# Patient Record
Sex: Male | Born: 1965 | Race: Black or African American | Hispanic: No | Marital: Married | Smoking: Never smoker
Health system: Southern US, Community
[De-identification: ages and names within clinical notes are randomized; demographics above are authoritative.]

## PROBLEM LIST (undated history)

## (undated) DIAGNOSIS — M1990 Unspecified osteoarthritis, unspecified site: Secondary | ICD-10-CM

## (undated) DIAGNOSIS — E785 Hyperlipidemia, unspecified: Secondary | ICD-10-CM

## (undated) DIAGNOSIS — E119 Type 2 diabetes mellitus without complications: Secondary | ICD-10-CM

## (undated) DIAGNOSIS — I10 Essential (primary) hypertension: Secondary | ICD-10-CM

## (undated) DIAGNOSIS — K219 Gastro-esophageal reflux disease without esophagitis: Secondary | ICD-10-CM

## (undated) DIAGNOSIS — G500 Trigeminal neuralgia: Secondary | ICD-10-CM

## (undated) HISTORY — PX: OTHER SURGICAL HISTORY: SHX169

## (undated) HISTORY — DX: Hyperlipidemia, unspecified: E78.5

## (undated) HISTORY — DX: Gastro-esophageal reflux disease without esophagitis: K21.9

## (undated) HISTORY — DX: Essential (primary) hypertension: I10

## (undated) HISTORY — PX: KNEE SURGERY: SHX244

## (undated) HISTORY — DX: Type 2 diabetes mellitus without complications: E11.9

## (undated) HISTORY — DX: Trigeminal neuralgia: G50.0

## (undated) HISTORY — PX: COLONOSCOPY: SHX174

## (undated) HISTORY — DX: Unspecified osteoarthritis, unspecified site: M19.90

---

## 2008-01-10 ENCOUNTER — Emergency Department (HOSPITAL_COMMUNITY): Admission: EM | Admit: 2008-01-10 | Discharge: 2008-01-10 | Payer: Self-pay | Admitting: Emergency Medicine

## 2008-09-14 ENCOUNTER — Encounter: Admission: RE | Admit: 2008-09-14 | Discharge: 2008-09-14 | Payer: Self-pay | Admitting: Family Medicine

## 2010-06-01 ENCOUNTER — Emergency Department (HOSPITAL_COMMUNITY): Admission: EM | Admit: 2010-06-01 | Discharge: 2010-06-01 | Payer: Self-pay | Admitting: Family Medicine

## 2010-07-21 ENCOUNTER — Emergency Department (HOSPITAL_COMMUNITY)
Admission: EM | Admit: 2010-07-21 | Discharge: 2010-07-21 | Payer: Self-pay | Source: Home / Self Care | Admitting: Family Medicine

## 2014-02-23 ENCOUNTER — Ambulatory Visit (INDEPENDENT_AMBULATORY_CARE_PROVIDER_SITE_OTHER): Payer: 59 | Admitting: Endocrinology

## 2014-02-23 ENCOUNTER — Encounter: Payer: Self-pay | Admitting: Endocrinology

## 2014-02-23 VITALS — BP 119/80 | HR 78 | Temp 98.0°F | Resp 16 | Ht 73.0 in | Wt 341.6 lb

## 2014-02-23 DIAGNOSIS — IMO0001 Reserved for inherently not codable concepts without codable children: Secondary | ICD-10-CM

## 2014-02-23 DIAGNOSIS — E7800 Pure hypercholesterolemia, unspecified: Secondary | ICD-10-CM

## 2014-02-23 DIAGNOSIS — N521 Erectile dysfunction due to diseases classified elsewhere: Secondary | ICD-10-CM | POA: Insufficient documentation

## 2014-02-23 DIAGNOSIS — E119 Type 2 diabetes mellitus without complications: Secondary | ICD-10-CM | POA: Insufficient documentation

## 2014-02-23 DIAGNOSIS — N529 Male erectile dysfunction, unspecified: Secondary | ICD-10-CM

## 2014-02-23 DIAGNOSIS — I10 Essential (primary) hypertension: Secondary | ICD-10-CM | POA: Insufficient documentation

## 2014-02-23 DIAGNOSIS — E1165 Type 2 diabetes mellitus with hyperglycemia: Principal | ICD-10-CM

## 2014-02-23 LAB — GLUCOSE, POCT (MANUAL RESULT ENTRY): POC GLUCOSE: 342 mg/dL — AB (ref 70–99)

## 2014-02-23 MED ORDER — CANAGLIFLOZIN 100 MG PO TABS
100.0000 mg | ORAL_TABLET | Freq: Every day | ORAL | Status: DC
Start: 2014-02-23 — End: 2014-04-21

## 2014-02-23 MED ORDER — GLIMEPIRIDE 2 MG PO TABS: 2.0000 mg | ORAL_TABLET | Freq: Every day | ORAL | Status: DC

## 2014-02-23 NOTE — Patient Instructions (Signed)
Please check blood sugars at least half the time about 2 hours after any meal and twice a week on waking up. Please bring blood sugar monitor to each visit Start Invokana in the morning and glimepiride at suppertime

## 2014-02-23 NOTE — Progress Notes (Signed)
Patient ID: Gregory Fitzgerald, male   DOB: September 21, 1965, 48 y.o.   MRN: [id number]     Reason for Appointment: Consultation for Type 2 Diabetes  History of Present Illness:          Diagnosis: Type 2 diabetes mellitus, date of diagnosis: 2006        Past history: At diagnosis he was not having any symptoms and glucose was discovered incidentally when he was found to be hypertensive He has been on metformin for several years and at some point was also taking Amaryl When his blood sugars were improved he was taken off Amaryl About a year ago because of high blood sugars he was started on Victoza which she tolerated well With this he was able to lose 20 pounds but he thinks his blood sugars were improved only slightly No previous records are available  Recent history:  His blood sugars have been significantly high since about 2013 Detailed records are not available but his A1c reportedly has been around 10% since then Currently is taking 1.8 mg Victoza along with metformin ER 1500 mg daily He did not bring his glucose monitor for review and not clear how often he is checking his readings Glucose in 7/15 on his labwork was 270 Currently does not feel excessively thirsty or have frequent urination. He does drink mostly water and sometimes Gatorade when he is thirsty        Oral hypoglycemic drugs the patient is taking are: Metformin ER      Side effects from medications have been: None Compliance with the medical regimen: Fair  Hypoglycemia: Never    Glucose monitoring:  done 0-2 time a day         Glucometer: Freestyle, this is several years old    Blood Glucose readings  Lowest 140, suppertime as high as 210.  Glycemic control: A1c was 9.7% on 01/27/14   No results found for this basename: HGBA1C   No results found for this basename: GLUF, MICROALBUR, LDLCALC, CREATININE   Urine microalbumin/creatinine ratio 97, done in 7/15 Retinal exam: Most recent: 2014 Self-care: The diet that the  patient has been following is none. He says he has difficulty controlling portions     Meals: 3 meals per day. Breakfast is an egg and toast or boluses.Marland Kitchen His meals are usually  meat and vegetables. He will have snacks with fruit. Eating out about 3 times a week         Exercise: Variable activity, sometimes moving the lawn and occasionally walking and periodically he may go to the gym and use elliptical       Dietician visit: Most recent: 2008 .               Weight history: previous range  320-350  Wt Readings from Last 3 Encounters:  02/23/14 341 lb 9.6 oz (154.949 kg)       Medication List       This list is accurate as of: 02/23/14 11:58 AM.  Always use your most recent med list.               amLODipine 10 MG tablet  Commonly known as:  NORVASC  10 mg daily.     aspirin 81 MG tablet  Take 81 mg by mouth daily.     atorvastatin 20 MG tablet  Commonly known as:  LIPITOR     FREESTYLE LITE test strip  Generic drug:  glucose blood  2 (two) times  daily.     lisinopril-hydrochlorothiazide 20-12.5 MG per tablet  Commonly known as:  PRINZIDE,ZESTORETIC  daily.     metFORMIN 500 MG 24 hr tablet  Commonly known as:  GLUCOPHAGE-XR  3 tablets daily     NOVOFINE 32G X 6 MM Misc  Generic drug:  Insulin Pen Needle     VIAGRA 50 MG tablet  Generic drug:  sildenafil     VICTOZA 18 MG/3ML Sopn  Generic drug:  Liraglutide  1.8 mg.        Allergies: No Known Allergies  No past medical history on file.  No past surgical history on file.  No family history on file.  Social History:  reports that he has never smoked. He has never used smokeless tobacco. His alcohol and drug histories are not on file.    Review of Systems       Lipids: treated with Lipitor 20 mg daily, last LDL 72, HDL 36 and triglycerides 92        No results found for this basename: CHOL, HDL, LDLCALC, LDLDIRECT, TRIG, CHOLHDL                  Skin: No rash or infections     Thyroid:  No   unusual fatigue.     The blood pressure has been controlled with amlodipine and Zestoretic       No swelling of feet.     No shortness of breath or chest tightness  on exertion.     Bowel habits: Normal.       No frequency of urination, occasionally has nocturia    He has had erectile dysfunction for the last few years       No joint  pains.          No history of Numbness, tingling in feet; sometimes he will feel tender in his feet or her burning sensation   LABS:  Office Visit on 02/23/2014  Component Date Value Ref Range Status  . POC Glucose 02/23/2014 342* 70 - 99 mg/dl Final    Physical Examination:  BP 119/80  Pulse 78  Temp(Src) 98 F (36.7 C)  Resp 16  Ht 6\' 1"  (1.854 m)  Wt 341 lb 9.6 oz (154.949 kg)  BMI 45.08 kg/m2  SpO2 95%  GENERAL:         Patient has generalized obesity.   HEENT:         Eye exam shows normal external appearance. Fundus exam shows no retinopathy. Oral exam shows normal mucosa .  NECK:         General:  Neck exam shows no lymphadenopathy. Carotids are normal to palpation and no bruit heard.  Thyroid is not enlarged and no nodules felt.   LUNGS:         Chest is symmetrical. Lungs are clear to auscultation.Marland Kitchen   HEART:         Heart sounds:  S1 and S2 are normal. No murmurs or clicks heard., no S3 or S4.   ABDOMEN:   There is no distention present. Liver and spleen are not palpable. No other mass or tenderness present.  EXTREMITIES:     There is no edema. No skin lesions present.Marland Kitchen  NEUROLOGICAL:   Vibration sense is moderately reduced in toes. Ankle and biceps jerks are absent bilaterally.          Diabetic foot exam: Diabetic foot exam shows normal monofilament sensation in the toes and plantar surfaces, no skin  lesions or ulcers on the feet.  Some callus formation on the big toes especially right Absent left dorsalis pedis, posterior tibialis 2+, right foot pulses are normal MUSCULOSKELETAL:       There is no enlargement or deformity of  the joints. Spine is normal to inspection.Marland Kitchen   SKIN:       No rash or lesions of concern.        ASSESSMENT:  Diabetes type 2, uncontrolled with BMI 45  He has not done well with current regimen of metformin and Victoza and A1c is 9.7% recently Most likely has had progressive insulin deficiency, highest glucose recently is 342 today However he is not currently symptomatic with his hyperglycemia He does not report very high readings at home and not clear how accurate his own FreeStyle monitor is  Also does have some difficulty controlling portions despite using Victoza 1.8 mg He is trying to be active but does not have regular exercise program Although he is a candidate for starting insulin would prefer trying him on alternative drugs to help with control and also facilitate weight loss and he should benefit from using a drug like Invokana.    Discussed action of SGLT 2 drugs on lowering glucose by decreasing kidney absorption of glucose, benefits of weight loss and lower blood pressure, possible side effects including candidiasis and dosage regimen   Other problems include inadequate glucose monitoring, possibly inaccurate home glucose monitor   He could also benefit from more diabetes education including nutritional counseling   Complications: Erectile dysfunction  PLAN:   Start monitoring blood sugars more consistently and bring monitor for download. He will use a new monitor that was given to make sure it is accurate  More consistent exercise  Consultation with dietitian for meal planning  Add Invokana 100 mg daily in the morning, he will watch for any side effects and also monitor blood pressure was starting this   Add glimepiride 2 mg daily at suppertime to help with overnight hypoglycemia  Consider adding Qsymia for weight loss and portion control  Have given him a new FreeStyle glucose monitor to start using   If blood sugars are not improved with this regimen will consider  insulin  Followup in one month  Counseling time over 50% of today's 60 minute visit  Pearle Wandler 02/23/2014, 11:58 AM   Note: This office note was prepared with Estate agent. Any transcriptional errors that result from this process are unintentional.

## 2014-03-13 ENCOUNTER — Other Ambulatory Visit: Payer: Self-pay | Admitting: *Deleted

## 2014-03-13 ENCOUNTER — Telehealth: Payer: Self-pay | Admitting: Endocrinology

## 2014-03-13 DIAGNOSIS — E119 Type 2 diabetes mellitus without complications: Secondary | ICD-10-CM

## 2014-03-13 MED ORDER — METFORMIN HCL ER 500 MG PO TB24: 500.0000 mg | ORAL_TABLET | Freq: Every day | ORAL | Status: DC

## 2014-03-13 NOTE — Telephone Encounter (Signed)
rx sent- patient aware  

## 2014-03-13 NOTE — Telephone Encounter (Signed)
Patient need a refill of his metformin 500 mg.  Wife would like to know if patient should get his meds from Dr Dwyane Dee or his primary Dr Zadie Rhine?  Please Advise

## 2014-04-21 ENCOUNTER — Other Ambulatory Visit: Payer: Self-pay | Admitting: Endocrinology

## 2014-04-23 ENCOUNTER — Ambulatory Visit (INDEPENDENT_AMBULATORY_CARE_PROVIDER_SITE_OTHER): Payer: 59 | Admitting: Endocrinology

## 2014-04-23 ENCOUNTER — Encounter: Payer: Self-pay | Admitting: Endocrinology

## 2014-04-23 VITALS — BP 118/64 | HR 78 | Temp 98.0°F | Resp 16 | Ht 73.0 in | Wt 345.2 lb

## 2014-04-23 DIAGNOSIS — I10 Essential (primary) hypertension: Secondary | ICD-10-CM

## 2014-04-23 DIAGNOSIS — Z23 Encounter for immunization: Secondary | ICD-10-CM

## 2014-04-23 DIAGNOSIS — IMO0002 Reserved for concepts with insufficient information to code with codable children: Secondary | ICD-10-CM

## 2014-04-23 DIAGNOSIS — E1165 Type 2 diabetes mellitus with hyperglycemia: Secondary | ICD-10-CM

## 2014-04-23 DIAGNOSIS — N521 Erectile dysfunction due to diseases classified elsewhere: Secondary | ICD-10-CM

## 2014-04-23 MED ORDER — CANAGLIFLOZIN 300 MG PO TABS: 300.0000 mg | ORAL_TABLET | Freq: Every day | ORAL | Status: DC

## 2014-04-23 MED ORDER — TADALAFIL 20 MG PO TABS: 20.0000 mg | ORAL_TABLET | Freq: Every day | ORAL | Status: DC | PRN

## 2014-04-23 NOTE — Patient Instructions (Signed)
Take 2 Invokana and next Rx 300mg  in am  Glimeperide at bedtime  Exercise daily  1 Lisinopril daily  Please check blood sugars at least half the time about 2 hours after any meal and 3-4 times per week on waking up. Please bring blood sugar monitor to each visit

## 2014-04-23 NOTE — Progress Notes (Addendum)
Patient ID: Gregory Fitzgerald, male   DOB: 05/08/1966, 48 y.o.   MRN: [id number]     Reason for Appointment: Follow up forType 2 Diabetes  History of Present Illness:          Diagnosis: Type 2 diabetes mellitus, date of diagnosis: 2006        Past history: At diagnosis he was not having any symptoms and glucose was discovered incidentally when he was found to be hypertensive He has been on metformin for several years and at some point was also taking Amaryl When his blood sugars were improved he was taken off Amaryl About a year ago because of high blood sugars he was started on Victoza  With this he was able to lose 20 pounds but he thinks his blood sugars were improved only slightly His blood sugars had been significantly high since about 2013 with A1c around 10%  Recent history:   Detailed records are not available but his A1c reportedly has been around 10% since then On his initial consultation he was taking 1.8 mg Victoza along with metformin ER 1500 mg daily Baseline A1c was 9.7% Because of his obesity he was started on Invokana 100 mg for better control Also Amaryl 2 mg was added to help with overnight hyperglycemia He has not followed up since his first visit about 2 months ago However his blood sugars at home are significantly better although still relatively high in the mornings Has only a few readings after her evening meal and these appear to be relatively good No side effects from [place] and his blood pressure is relatively lower also However has not lost any weight        Oral hypoglycemic drugs the patient is taking are: Metformin ER      Side effects from medications have been: None Compliance with the medical regimen: Fair  Hypoglycemia: Never    Glucose monitoring:  done  2 time a day         Glucometer: Freestyle    Blood Glucose readings from download as follows; his meter is about 4 hours behind and difficult to analyze the blood sugar by time of day   PREMEAL  Breakfast Lunch Dinner PCS Overall  Glucose range: 126-197 118-172 123-173 113-171   Mean/median: 155    154   Glycemic control: A1c was 9.7% on 01/27/14   No results found for this basename: HGBA1C   No results found for this basename: GLUF,  MICROALBUR,  LDLCALC,  CREATININE   Urine microalbumin/creatinine ratio 97, done in 7/15 Retinal exam: Most recent: 2014 Self-care: The diet that the patient has been following is none. He says he has difficulty controlling portions     Meals: 3 meals per day. Breakfast is an egg and toast. His meals are usually  meat and vegetables. He will have snacks with fruit.  Eating out about 3 times a week         Exercise: Variable activity: occasionally walking and periodically he may go to the gym and use elliptical       Dietician visit: Most recent: 2008 .               Weight history: previous range  320-350  Wt Readings from Last 3 Encounters:  04/23/14 345 lb 3.2 oz (156.582 kg)  02/23/14 341 lb 9.6 oz (154.949 kg)       Medication List       This list is accurate as of: 04/23/14  4:18 PM.  Always use your most recent med list.               amLODipine 10 MG tablet  Commonly known as:  NORVASC  10 mg daily.     aspirin 81 MG tablet  Take 81 mg by mouth daily.     atorvastatin 20 MG tablet  Commonly known as:  LIPITOR     FREESTYLE LITE test strip  Generic drug:  glucose blood  2 (two) times daily.     glimepiride 2 MG tablet  Commonly known as:  AMARYL  Take 1 tablet (2 mg total) by mouth daily before supper.     INVOKANA 100 MG Tabs  Generic drug:  Canagliflozin  TAKE 1 TABLET (100 MG TOTAL) BY MOUTH DAILY BEFORE BREAKFAST.     lisinopril-hydrochlorothiazide 20-12.5 MG per tablet  Commonly known as:  PRINZIDE,ZESTORETIC  daily.     metFORMIN 500 MG 24 hr tablet  Commonly known as:  GLUCOPHAGE-XR  Take 1 tablet (500 mg total) by mouth daily with breakfast. 3 tablets daily     NOVOFINE 32G X 6 MM Misc  Generic  drug:  Insulin Pen Needle     VIAGRA 50 MG tablet  Generic drug:  sildenafil     VICTOZA 18 MG/3ML Sopn  Generic drug:  Liraglutide  1.8 mg.        Allergies: No Known Allergies  No past medical history on file.  No past surgical history on file.  Family History  Problem Relation Age of Onset  . Diabetes Mother   . Diabetes Father     Social History:  reports that he has never smoked. He has never used smokeless tobacco. His alcohol and drug histories are not on file.    Review of Systems       Lipids: treated with Lipitor 20 mg daily, last LDL 72, HDL 36 and triglycerides 92        No results found for this basename: CHOL,  HDL,  LDLCALC,  LDLDIRECT,  TRIG,  CHOLHDL                   The blood pressure has been controlled with amlodipine and 2 tablets of Zestoretic: checked occasionally at work and readings are similar to today's reading     Complications: Erectile dysfunction. He says that he gets headaches with Viagra 50 mg and is asking for an alternative; is getting only partial improvement of symptoms with this  LABS:  pending  Physical Examination:  BP 118/64  Pulse 78  Temp(Src) 98 F (36.7 C)  Resp 16  Ht 6\' 1"  (1.854 m)  Wt 345 lb 3.2 oz (156.582 kg)  BMI 45.55 kg/m2  SpO2 97%  No pedal edema    ASSESSMENT:  Diabetes type 2, uncontrolled with BMI 45  He has had improved blood sugar control with adding Invokana 100 mg and low-dose Amaryl to his prior regimen of metformin and Victoza On fasting blood sugars are still averaging about 150, blood sugars are relatively lower later in the day He probably had average readings well over 200 prior to this therapy change He may have some insulin deficiency but appears to be responding to low dose Amaryl He may be able to improve his control with higher doses of Invokana and increased efforts to lose weight However basal insulin may need to be started  Erectile dysfunction  secondary to diabetes. He  has only partial response to Viagra and  getting side effects of headaches  PLAN:   Start monitoring blood sugars more consistently after meals  Monitor time was changed to the correct time  Change Amaryl to bedtime and consider increasing the dose if needed  Increase Invokana to 300 mg and discussed possible side effects as well as expected blood pressure reduction  Reduce lisinopril to one tablet instead of 2  More consistent exercise especially aerobic  Consultation with dietitian  Trial of Cialis 20 mg, half to one tablet as needed. Discussed how to take his medication and possible side effects  Counseling time over 50% of today's 25 minute visit   Carless Slatten 04/23/2014, 4:18 PM   Note: This office note was prepared with Estate agent. Any transcriptional errors that result from this process are unintentional.

## 2014-04-24 LAB — BASIC METABOLIC PANEL
BUN: 27 mg/dL — ABNORMAL HIGH (ref 6–23)
CALCIUM: 9.8 mg/dL (ref 8.4–10.5)
CO2: 24 mEq/L (ref 19–32)
Chloride: 104 mEq/L (ref 96–112)
Creatinine, Ser: 1.3 mg/dL (ref 0.4–1.5)
GFR: 77.06 mL/min (ref >60.00)
Glucose, Bld: 119 mg/dL — ABNORMAL HIGH (ref 70–99)
Potassium: 4.6 mEq/L (ref 3.5–5.1)
SODIUM: 137 meq/L (ref 135–145)

## 2014-04-24 LAB — HEMOGLOBIN A1C: HEMOGLOBIN A1C: 6.6 % — AB (ref 4.6–6.5)

## 2014-06-01 ENCOUNTER — Other Ambulatory Visit: Payer: 59

## 2014-06-01 ENCOUNTER — Other Ambulatory Visit: Payer: Self-pay | Admitting: *Deleted

## 2014-06-01 DIAGNOSIS — E1165 Type 2 diabetes mellitus with hyperglycemia: Secondary | ICD-10-CM

## 2014-06-01 DIAGNOSIS — IMO0002 Reserved for concepts with insufficient information to code with codable children: Secondary | ICD-10-CM

## 2014-06-01 DIAGNOSIS — E7800 Pure hypercholesterolemia, unspecified: Secondary | ICD-10-CM

## 2014-06-04 ENCOUNTER — Ambulatory Visit: Payer: 59 | Admitting: Endocrinology

## 2014-06-04 ENCOUNTER — Telehealth: Payer: Self-pay | Admitting: Endocrinology

## 2014-06-04 NOTE — Telephone Encounter (Signed)
Patient no showed today's appt. Please advise on how to follow up. °A. No follow up necessary. °B. Follow up urgent. Contact patient immediately. °C. Follow up necessary. Contact patient and schedule visit in ___ days. °D. Follow up advised. Contact patient and schedule visit in ____weeks. ° °

## 2014-06-04 NOTE — Telephone Encounter (Signed)
Needs to be rescheduled at earliest possible opportunity

## 2014-06-08 NOTE — Telephone Encounter (Signed)
Pt needed to call us back to reschedule. He was at work

## 2014-06-10 ENCOUNTER — Ambulatory Visit (INDEPENDENT_AMBULATORY_CARE_PROVIDER_SITE_OTHER): Payer: 59 | Admitting: Endocrinology

## 2014-06-10 ENCOUNTER — Other Ambulatory Visit: Payer: Self-pay | Admitting: Endocrinology

## 2014-06-10 ENCOUNTER — Encounter: Payer: Self-pay | Admitting: Endocrinology

## 2014-06-10 VITALS — BP 90/68 | HR 88 | Temp 98.0°F | Resp 14 | Ht 73.0 in | Wt 342.6 lb

## 2014-06-10 DIAGNOSIS — E1165 Type 2 diabetes mellitus with hyperglycemia: Secondary | ICD-10-CM

## 2014-06-10 DIAGNOSIS — IMO0002 Reserved for concepts with insufficient information to code with codable children: Secondary | ICD-10-CM

## 2014-06-10 DIAGNOSIS — I10 Essential (primary) hypertension: Secondary | ICD-10-CM

## 2014-06-10 LAB — COMPREHENSIVE METABOLIC PANEL
ALBUMIN: 4.4 g/dL (ref 3.5–5.2)
ALK PHOS: 59 U/L (ref 39–117)
ALT: 15 U/L (ref 0–53)
AST: 16 U/L (ref 0–37)
BUN: 23 mg/dL (ref 6–23)
CO2: 27 mEq/L (ref 19–32)
Calcium: 10.1 mg/dL (ref 8.4–10.5)
Chloride: 102 mEq/L (ref 96–112)
Creat: 1.22 mg/dL (ref 0.50–1.35)
Glucose, Bld: 107 mg/dL — ABNORMAL HIGH (ref 70–99)
POTASSIUM: 4.9 meq/L (ref 3.5–5.3)
SODIUM: 139 meq/L (ref 135–145)
TOTAL PROTEIN: 8 g/dL (ref 6.0–8.3)
Total Bilirubin: 0.8 mg/dL (ref 0.2–1.2)

## 2014-06-10 MED ORDER — LISINOPRIL 10 MG PO TABS: 10.0000 mg | ORAL_TABLET | Freq: Every day | ORAL | Status: DC

## 2014-06-10 NOTE — Patient Instructions (Addendum)
Please check blood sugars at least half the time about 2 hours after any meal and times per week on waking up. Please bring blood sugar monitor to each visit  Glimeperide at 7-8 pm daily  Keep walking  Either do 1/2 current BP pill or Lisiniopril 10mg 

## 2014-06-10 NOTE — Progress Notes (Signed)
Patient ID: Gregory Fitzgerald, male   DOB: 10-Mar-1966, 48 y.o.   MRN: [id number]     Reason for Appointment: Follow up forType 2 Diabetes  History of Present Illness:          Diagnosis: Type 2 diabetes mellitus, date of diagnosis: 2006        Past history: At diagnosis he was not having any symptoms and glucose was discovered incidentally when he was found to be hypertensive He has been on metformin for several years and at some point was also taking Amaryl When his blood sugars were improved he was taken off Amaryl About a year ago because of high blood sugars he was started on Victoza  With this he was able to lose 20 pounds but he thinks his blood sugars were improved only slightly His blood sugars had been significantly high since about 2013 with A1c around 10%  Recent history:  On his initial consultation he was taking 1.8 mg Victoza along with metformin ER 1500 mg daily Baseline A1c was 9.7% Because of his obesity he was started on Invokana 100 mg for better control; Amaryl 2 mg was added because of fasting hyperglycemia A1c had come down to 6.6 in October 2015 On his last visit his Amaryl was changed to bedtime instead of morning and Invokana was increased to 300 mg because of continued high blood sugars He was also asked to check more sugars after meals instead of just in the morning Although his blood sugars are improving he tends to forget to take his Amaryl at bedtime and has occasional high readings in the mornings; less in the last week He has not had any hypoglycemic symptoms Weight has come down 3 pounds        Oral hypoglycemic drugs the patient is taking are: Metformin ER, Invokana, Amaryl      Side effects from medications have been: None Compliance with the medical regimen: Fair  Hypoglycemia: Never    Glucose monitoring:  done  2 time a day         Glucometer: Freestyle    Blood Glucose readings from download as follows :  PREMEAL Breakfast Lunch Dinner PCS  Overall  Glucose range: 91-208  104-116 150   Mean/median:     136    Glycemic control: A1c was 9.7% on 01/27/14   Lab Results  Component Value Date   HGBA1C 6.6* 04/23/2014   No results found for: GLUF Urine microalbumin/creatinine ratio 97, done in 7/15  Retinal exam: Most recent: 2014 Self-care: The diet that the patient has been following is none. He says he has difficulty controlling portions     Meals: 3 meals per day. Breakfast is an egg and toast. His meals are usually  meat and vegetables. He will have snacks with fruit.  Eating out about 3 times a week         Exercise: Variable activity: walking more regularly now Dietician visit: Most recent: 2008 .               Weight history: previous range  320-350  Wt Readings from Last 3 Encounters:  06/10/14 342 lb 9.6 oz (155.402 kg)  04/23/14 345 lb 3.2 oz (156.582 kg)  02/23/14 341 lb 9.6 oz (154.949 kg)       Medication List       This list is accurate as of: 06/10/14  3:29 PM.  Always use your most recent med list.  amLODipine 10 MG tablet  Commonly known as:  NORVASC  10 mg daily.     aspirin 81 MG tablet  Take 81 mg by mouth daily.     atorvastatin 20 MG tablet  Commonly known as:  LIPITOR     canagliflozin 300 MG Tabs tablet  Commonly known as:  INVOKANA  Take 1 tablet (300 mg total) by mouth daily before breakfast.     FREESTYLE LITE test strip  Generic drug:  glucose blood  2 (two) times daily.     glimepiride 2 MG tablet  Commonly known as:  AMARYL  Take 1 tablet (2 mg total) by mouth daily before supper.     lisinopril-hydrochlorothiazide 20-12.5 MG per tablet  Commonly known as:  PRINZIDE,ZESTORETIC  daily.     metFORMIN 500 MG 24 hr tablet  Commonly known as:  GLUCOPHAGE-XR  Take 1 tablet (500 mg total) by mouth daily with breakfast. 3 tablets daily     NOVOFINE 32G X 6 MM Misc  Generic drug:  Insulin Pen Needle     tadalafil 20 MG tablet  Commonly known as:   CIALIS  Take 1 tablet (20 mg total) by mouth daily as needed for erectile dysfunction.     VICTOZA 18 MG/3ML Sopn  Generic drug:  Liraglutide  1.8 mg.        Allergies: No Known Allergies  No past medical history on file.  No past surgical history on file.  Family History  Problem Relation Age of Onset  . Diabetes Mother   . Diabetes Father     Social History:  reports that he has never smoked. He has never used smokeless tobacco. His alcohol and drug histories are not on file.    Review of Systems       Lipids: treated with Lipitor 20 mg daily, last LDL 72, HDL 36 and triglycerides 92        No results found for: CHOL                 The blood pressure has been controlled with amlodipine and  Zestoretic: because of his starting Invokana the Zestoretic was reduced to 1 tablet Blood pressure is checked occasionally at work and readings are  110-115/70.  Does not complain of lightheadedness     Complications: Erectile dysfunction. Cialis 20mg  is more effective than Viagra and has no side effects compared to Viagra     Physical Examination:  BP 112/72 mmHg  Pulse 88  Temp(Src) 98 F (36.7 C)  Resp 14  Ht 6\' 1"  (1.854 m)  Wt 342 lb 9.6 oz (155.402 kg)  BMI 45.21 kg/m2  SpO2 95%  No pedal edema    ASSESSMENT:  Diabetes type 2, uncontrolled with BMI 45  He has had improved blood sugar control with using Invokana 300 mg and low-dose Amaryl in the evenings along with his prior regimen of metformin and Victoza His A1c was 6.6 in 10/15 He has done better with changing the Amaryl to the evening but may occasionally forget this at night Fasting blood sugars are still relatively higher than suppertime but has not done enough readings after meals again  HYPERTENSION: His blood pressure is significantly lower with adding Invokana and he is orthostatic today   PLAN:   Start monitoring blood sugars more consistently after meals  More consistent exercise  Change  Amaryl to 7-8 PM to help with compliance  Reduce lisinopril HCT to half a tablet and with next prescription  he will use only 10 mg lisinopril  Call if blood sugars are not well-controlled    Daylin Eads 06/10/2014, 3:29 PM   Note: This office note was prepared with Estate agent. Any transcriptional errors that result from this process are unintentional.

## 2014-08-26 ENCOUNTER — Other Ambulatory Visit: Payer: Self-pay | Admitting: Endocrinology

## 2014-08-30 ENCOUNTER — Other Ambulatory Visit: Payer: Self-pay | Admitting: Endocrinology

## 2014-09-07 ENCOUNTER — Other Ambulatory Visit: Payer: 59

## 2014-09-07 ENCOUNTER — Encounter: Payer: Self-pay | Admitting: Endocrinology

## 2014-09-10 ENCOUNTER — Ambulatory Visit: Payer: 59 | Admitting: Endocrinology

## 2015-01-05 ENCOUNTER — Other Ambulatory Visit: Payer: Self-pay | Admitting: Endocrinology

## 2015-01-28 ENCOUNTER — Other Ambulatory Visit: Payer: Self-pay | Admitting: Endocrinology

## 2015-02-07 ENCOUNTER — Other Ambulatory Visit: Payer: Self-pay | Admitting: Endocrinology

## 2015-02-18 ENCOUNTER — Ambulatory Visit (INDEPENDENT_AMBULATORY_CARE_PROVIDER_SITE_OTHER): Payer: Commercial Managed Care - HMO | Admitting: Endocrinology

## 2015-02-18 ENCOUNTER — Other Ambulatory Visit: Payer: Self-pay | Admitting: *Deleted

## 2015-02-18 ENCOUNTER — Encounter: Payer: Self-pay | Admitting: Endocrinology

## 2015-02-18 VITALS — BP 138/76 | HR 96 | Temp 98.2°F | Resp 16 | Ht 73.0 in | Wt 345.4 lb

## 2015-02-18 DIAGNOSIS — E1165 Type 2 diabetes mellitus with hyperglycemia: Secondary | ICD-10-CM

## 2015-02-18 DIAGNOSIS — IMO0002 Reserved for concepts with insufficient information to code with codable children: Secondary | ICD-10-CM

## 2015-02-18 DIAGNOSIS — E7800 Pure hypercholesterolemia, unspecified: Secondary | ICD-10-CM

## 2015-02-18 DIAGNOSIS — I10 Essential (primary) hypertension: Secondary | ICD-10-CM

## 2015-02-18 LAB — POCT GLYCOSYLATED HEMOGLOBIN (HGB A1C): Hemoglobin A1C: 8.9

## 2015-02-18 MED ORDER — METFORMIN HCL ER 500 MG PO TB24: 500.0000 mg | ORAL_TABLET | Freq: Every day | ORAL | Status: DC

## 2015-02-18 MED ORDER — LIRAGLUTIDE 18 MG/3ML ~~LOC~~ SOPN: 1.8000 mg | PEN_INJECTOR | Freq: Every day | SUBCUTANEOUS | Status: DC

## 2015-02-18 MED ORDER — TADALAFIL 20 MG PO TABS: 20.0000 mg | ORAL_TABLET | Freq: Every day | ORAL | Status: DC | PRN

## 2015-02-18 MED ORDER — GLIMEPIRIDE 2 MG PO TABS: 2.0000 mg | ORAL_TABLET | Freq: Every day | ORAL | Status: DC

## 2015-02-18 MED ORDER — GLUCOSE BLOOD VI STRP: ORAL_STRIP | Status: DC

## 2015-02-18 NOTE — Patient Instructions (Addendum)
Check blood sugars on waking up .Marland Kitchen2-3  .Marland Kitchen times a week Also check blood sugars about 2 hours after a meal and do this after different meals by rotation  Recommended blood sugar levels on waking up is 90-130 and about 2 hours after meal is 140-180 Please bring blood sugar monitor to each visit.  Victoza start with 0.6 mg and go up every 3 days  Walk daily

## 2015-02-18 NOTE — Progress Notes (Signed)
Patient ID: Gregory Fitzgerald, male   DOB: 08-05-1965, 49 y.o.   MRN: [id number]     Reason for Appointment: Follow up forType 2 Diabetes  History of Present Illness:          Diagnosis: Type 2 diabetes mellitus, date of diagnosis: 2006        Past history: At diagnosis he was not having any symptoms and glucose was discovered incidentally when he was found to be hypertensive He has been on metformin for several years and at some point was also taking Amaryl His blood sugars had been significantly high since about 2013 with A1c around 10% On his initial consultation he was taking 1.8 mg Victoza along with metformin ER 1500 mg daily Baseline A1c was 9.7% Because of his obesity he was started on Invokana 100 mg for better control; Amaryl 2 mg was added because of fasting hyperglycemia A1c had come down to 6.6 in October 2015  Recent history:  7/16   On his last visit his control was excellent with A1c 6.6%   Amaryl was changed to bedtime instead of morning and Invokana was increased to 300 mg previously He has not been seen in follow-up for almost a year now  He ran out of his Invokana and Victoza about 2 months ago and is now here for further follow-up Also has not had a fasting blood sugar monitor for the last week at least  Not sure if he is taking his blood sugar readings after meals also He has not been symptomatic with high readings and has not lost any weight Not very active on his own and meal compliance is variable        Oral hypoglycemic drugs the patient is taking are: Metformin ER, Invokana, Amaryl      Side effects from medications have been: None Compliance with the medical regimen: Fair  Hypoglycemia: Never    Glucose monitoring:  done  0-2 time a day         Glucometer: Freestyle    Blood Glucose readings from recall  recently around 200   Lab Results  Component Value Date   HGBA1C 8.9 02/18/2015   HGBA1C 6.6* 04/23/2014   Lab Results  Component Value Date   CREATININE 1.22 06/10/2014      Urine microalbumin/creatinine ratio 97, done in 7/15  Self-care: The diet that the patient has been following is none. He says he has difficulty controlling portions     Meals: 3 meals per day. Breakfast is an egg and toast. His meals are usually  meat and vegetables. He will have snacks with fruit.  Eating out about 3 times a week         Exercise: Variable activity: walking At times, sometimes more at work Dietician visit: Most recent: 2008 .               Weight history: previous range  320-350  Wt Readings from Last 3 Encounters:  02/18/15 345 lb 6.4 oz (156.672 kg)  06/10/14 342 lb 9.6 oz (155.402 kg)  04/23/14 345 lb 3.2 oz (156.582 kg)       Medication List       This list is accurate as of: 02/18/15 11:59 PM.  Always use your most recent med list.               amLODipine 10 MG tablet  Commonly known as:  NORVASC  10 mg daily.     aspirin 81 MG tablet  Take 81 mg by mouth daily.     atorvastatin 20 MG tablet  Commonly known as:  LIPITOR     glimepiride 2 MG tablet  Commonly known as:  AMARYL  Take 1 tablet (2 mg total) by mouth daily before supper.     glucose blood test strip  Commonly known as:  FREESTYLE LITE  Use to check blood sugar 3 times per day dx code E11.65     INVOKANA 300 MG Tabs tablet  Generic drug:  canagliflozin  TAKE 1 TABLET BY MOUTH EVERY DAY BEFORE BREAKFAST     Liraglutide 18 MG/3ML Sopn  Commonly known as:  VICTOZA  Inject 0.3 mLs (1.8 mg total) into the skin daily.     lisinopril 10 MG tablet  Commonly known as:  ZESTRIL  Take 1 tablet (10 mg total) by mouth daily.     lisinopril-hydrochlorothiazide 20-12.5 MG per tablet  Commonly known as:  PRINZIDE,ZESTORETIC  Take 2 tablets by mouth every morning.     metFORMIN 500 MG 24 hr tablet  Commonly known as:  GLUCOPHAGE-XR  Take 1 tablet (500 mg total) by mouth daily with breakfast. 3 tablets daily     NOVOFINE 32G X 6 MM Misc  Generic  drug:  Insulin Pen Needle     tadalafil 20 MG tablet  Commonly known as:  CIALIS  Take 1 tablet (20 mg total) by mouth daily as needed for erectile dysfunction.        Allergies: No Known Allergies  No past medical history on file.  No past surgical history on file.  Family History  Problem Relation Age of Onset  . Diabetes Mother   . Diabetes Father     Social History:  reports that he has never smoked. He has never used smokeless tobacco. His alcohol and drug histories are not on file.    Review of Systems        Lipids: treated with Lipitor 20 mg daily, last LDL 72, HDL 36 and triglycerides 92       No results found for: CHOL, HDL, LDLCALC, LDLDIRECT, TRIG, CHOLHDL     The blood pressure has been controlled with amlodipine and Zestoretic half tablet daily   Complications: Erectile dysfunction. Cialis 20mg  is effective and he is asking for refill     diabetic foot exam in 01/2015   Physical Examination:  BP 138/76 mmHg  Pulse 96  Temp(Src) 98.2 F (36.8 C)  Resp 16  Ht 6\' 1"  (1.854 m)  Wt 345 lb 6.4 oz (156.672 kg)  BMI 45.58 kg/m2  SpO2 96%  Diabetic foot exam shows normal monofilament sensation in the toes and plantar surfaces, no skin lesions or ulcers on the feet  Decreased pulses in feet, calluses Rt 1st toe     ASSESSMENT:  Diabetes type 2, uncontrolled with BMI 45  He has had improved blood sugar control with using Invokana 300 mg and low-dose Amaryl in the evenings along with his prior regimen of metformin and Victoza However he has run out of his Invokana and Victoza and blood sugars are significantly higher  His A1c was 6.6 in 10/15 and now it is up to 8.9  He has also not been checking his blood sugar regularly   HYPERTENSION: His blood pressure is fairly well controlled even with continuing half a tablet of Zestoretic   PLAN:   Start monitoring blood sugars more consistently   More consistent exercise  Follow-up in 6 weeks after  restarting  Invokana and Victoza.  Discussed titrating Victoza from a smaller dose and work up to 1.8 mg   Will need to check his microalbumin and lipids  Patient Instructions  Check blood sugars on waking up .Marland Kitchen2-3  .Marland Kitchen times a week Also check blood sugars about 2 hours after a meal and do this after different meals by rotation  Recommended blood sugar levels on waking up is 90-130 and about 2 hours after meal is 140-180 Please bring blood sugar monitor to each visit.  Victoza start with 0.6 mg and go up every 3 days  Walk daily     Gregory Fitzgerald 02/19/2015, 8:17 AM   Note: This office note was prepared with Estate agent. Any transcriptional errors that result from this process are unintentional.

## 2015-02-19 LAB — COMPREHENSIVE METABOLIC PANEL
ALK PHOS: 73 U/L (ref 39–117)
ALT: 14 U/L (ref 0–53)
AST: 18 U/L (ref 0–37)
Albumin: 4.2 g/dL (ref 3.5–5.2)
BILIRUBIN TOTAL: 0.6 mg/dL (ref 0.2–1.2)
BUN: 22 mg/dL (ref 6–23)
CALCIUM: 10.2 mg/dL (ref 8.4–10.5)
CHLORIDE: 104 meq/L (ref 96–112)
CO2: 25 meq/L (ref 19–32)
Creatinine, Ser: 1.27 mg/dL (ref 0.40–1.50)
GFR: 78.18 mL/min (ref >60.00)
GLUCOSE: 243 mg/dL — AB (ref 70–99)
Potassium: 4.3 mEq/L (ref 3.5–5.1)
Sodium: 140 mEq/L (ref 135–145)
Total Protein: 7.7 g/dL (ref 6.0–8.3)

## 2015-02-19 LAB — LIPID PANEL
CHOL/HDL RATIO: 4
Cholesterol: 199 mg/dL (ref 0–200)
HDL: 44.7 mg/dL (ref >39.00)
LDL CALC: 120 mg/dL — AB (ref 0–99)
NonHDL: 154.66
TRIGLYCERIDES: 173 mg/dL — AB (ref 0.0–149.0)
VLDL: 34.6 mg/dL (ref 0.0–40.0)

## 2015-02-19 LAB — MICROALBUMIN / CREATININE URINE RATIO
CREATININE, U: 231.1 mg/dL
MICROALB UR: 13.5 mg/dL — AB (ref 0.0–1.9)
MICROALB/CREAT RATIO: 5.8 mg/g (ref 0.0–30.0)

## 2015-02-19 LAB — HEMOGLOBIN A1C: HEMOGLOBIN A1C: 8.8 % — AB (ref 4.6–6.5)

## 2015-02-25 ENCOUNTER — Other Ambulatory Visit: Payer: Self-pay | Admitting: Endocrinology

## 2015-03-30 ENCOUNTER — Other Ambulatory Visit: Payer: Self-pay | Admitting: Endocrinology

## 2015-04-01 ENCOUNTER — Other Ambulatory Visit: Payer: Self-pay | Admitting: *Deleted

## 2015-04-01 ENCOUNTER — Other Ambulatory Visit: Payer: Commercial Managed Care - HMO

## 2015-04-01 ENCOUNTER — Other Ambulatory Visit (INDEPENDENT_AMBULATORY_CARE_PROVIDER_SITE_OTHER): Payer: Commercial Managed Care - HMO

## 2015-04-01 DIAGNOSIS — E1165 Type 2 diabetes mellitus with hyperglycemia: Secondary | ICD-10-CM

## 2015-04-01 DIAGNOSIS — IMO0002 Reserved for concepts with insufficient information to code with codable children: Secondary | ICD-10-CM

## 2015-04-01 LAB — BASIC METABOLIC PANEL
BUN: 43 mg/dL — ABNORMAL HIGH (ref 6–23)
CALCIUM: 9.5 mg/dL (ref 8.4–10.5)
CO2: 23 mEq/L (ref 19–32)
Chloride: 101 mEq/L (ref 96–112)
Creatinine, Ser: 1.77 mg/dL — ABNORMAL HIGH (ref 0.40–1.50)
GFR: 53.28 mL/min — AB (ref >60.00)
Glucose, Bld: 176 mg/dL — ABNORMAL HIGH (ref 70–99)
POTASSIUM: 4.4 meq/L (ref 3.5–5.1)
SODIUM: 134 meq/L — AB (ref 135–145)

## 2015-04-01 LAB — MICROALBUMIN / CREATININE URINE RATIO
CREATININE, U: 296.4 mg/dL
MICROALB UR: 2.9 mg/dL — AB (ref 0.0–1.9)
Microalb Creat Ratio: 1 mg/g (ref 0.0–30.0)

## 2015-04-01 MED ORDER — INSULIN PEN NEEDLE 32G X 6 MM MISC: Status: DC

## 2015-04-05 ENCOUNTER — Ambulatory Visit: Payer: Commercial Managed Care - HMO | Admitting: Endocrinology

## 2015-04-07 ENCOUNTER — Encounter: Payer: Self-pay | Admitting: Endocrinology

## 2015-04-07 ENCOUNTER — Ambulatory Visit (INDEPENDENT_AMBULATORY_CARE_PROVIDER_SITE_OTHER): Payer: Commercial Managed Care - HMO | Admitting: Endocrinology

## 2015-04-07 VITALS — BP 90/60 | HR 94 | Temp 99.1°F | Resp 16 | Ht 73.0 in | Wt 338.2 lb

## 2015-04-07 DIAGNOSIS — E1165 Type 2 diabetes mellitus with hyperglycemia: Secondary | ICD-10-CM

## 2015-04-07 DIAGNOSIS — N289 Disorder of kidney and ureter, unspecified: Secondary | ICD-10-CM

## 2015-04-07 DIAGNOSIS — I951 Orthostatic hypotension: Secondary | ICD-10-CM

## 2015-04-07 DIAGNOSIS — IMO0002 Reserved for concepts with insufficient information to code with codable children: Secondary | ICD-10-CM

## 2015-04-07 NOTE — Progress Notes (Signed)
Patient ID: Gregory Fitzgerald, male   DOB: 03-11-1966, 49 y.o.   MRN: [id number]     Reason for Appointment: Follow up forType 2 Diabetes  History of Present Illness:          Diagnosis: Type 2 diabetes mellitus, date of diagnosis: 2006        Past history: At diagnosis he was not having any symptoms and glucose was discovered incidentally when he was found to be hypertensive He has been on metformin for several years and at some point was also taking Amaryl His blood sugars had been significantly high since about 2013 with A1c around 10% On his initial consultation he was taking 1.8 mg Victoza along with metformin ER 1500 mg daily Baseline A1c was 9.7% Because of his obesity he was started on Invokana 100 mg for better control; Amaryl 2 mg was added because of fasting hyperglycemia A1c had come down to 6.6 in October 2015  Recent history:    He ran out of his [place] prior to his last visit and his A1c went up to 8.9%. Previously had no difficulties with taking these medications and he was told to start back on these again at the same dosage He was told to go up to 300 mg on Invokana. More recently has taken only 1.2 mg Victoza and has not gone up to 1.8 doses Also this week when his creatinine came back high he was told to take only half Invokana and reduce metformin to 1500 mg instead of 2000  However his blood sugars are still not well controlled with mostly high readings; also he did not have Victoza for 4 days recently  Blood sugars from his FreeStyle monitor show:  Mean values apply above for all meters except median for One Touch  PRE-MEAL Fasting Lunch Dinner Bedtime Overall  Glucose range:  126-213   184   178-190    126-213   Mean/median:  169      176     Oral hypoglycemic drugs the patient is taking are: Metformin ER, Invokana, Amaryl      Side effects from medications have been: None Compliance with the medical regimen: Fair  Hypoglycemia:  Never     Self-care: The diet that the patient has been following is none. He says he has difficulty controlling portions     Meals: 3 meals per day. Breakfast is an egg and toast. His meals are usually  meat and vegetables. He will have snacks with fruit.  Eating out about 3 times a week         Exercise: Variable activity: walking At times, sometimes more at work Dietician visit: Most recent: 2008 .   Wt Readings from Last 3 Encounters:  04/07/15 338 lb 3.2 oz (153.407 kg)  02/18/15 345 lb 6.4 oz (156.672 kg)  06/10/14 342 lb 9.6 oz (155.402 kg)      Lab Results  Component Value Date   HGBA1C 8.8* 02/18/2015   HGBA1C 8.9 02/18/2015   HGBA1C 6.6* 04/23/2014   Lab Results  Component Value Date   MICROALBUR 2.9* 04/01/2015   LDLCALC 120* 02/18/2015   CREATININE 1.77* 04/01/2015      Urine microalbumin/creatinine ratio 97, done in 7/15                Medication List       This list is accurate as of: 04/07/15 11:59 PM.  Always use your most recent med list.  amLODipine 10 MG tablet  Commonly known as:  NORVASC  10 mg daily.     aspirin 81 MG tablet  Take 81 mg by mouth daily.     atorvastatin 20 MG tablet  Commonly known as:  LIPITOR     glimepiride 2 MG tablet  Commonly known as:  AMARYL  Take 1 tablet (2 mg total) by mouth daily before supper.     glucose blood test strip  Commonly known as:  FREESTYLE LITE  Use to check blood sugar 3 times per day dx code E11.65     Insulin Pen Needle 32G X 6 MM Misc  Commonly known as:  NOVOFINE  Use 1 per day to inject Victoza     INVOKANA 300 MG Tabs tablet  Generic drug:  canagliflozin  TAKE 1 TABLET BY MOUTH EVERY DAY BEFORE BREAKFAST     INVOKANA 300 MG Tabs tablet  Generic drug:  canagliflozin  TAKE 1 TABLET BY MOUTH EVERY DAY BEFORE BREAKFAST     Liraglutide 18 MG/3ML Sopn  Commonly known as:  VICTOZA  Inject 0.3 mLs (1.8 mg total) into the skin daily.     lisinopril 10 MG tablet    Commonly known as:  ZESTRIL  Take 1 tablet (10 mg total) by mouth daily.     lisinopril-hydrochlorothiazide 20-12.5 MG per tablet  Commonly known as:  PRINZIDE,ZESTORETIC  Take 2 tablets by mouth every morning.     metFORMIN 500 MG 24 hr tablet  Commonly known as:  GLUCOPHAGE-XR  TAKE 4 TABLETS ONCE A DAY ORALLY *NEEDS OFFICE VISIT*     tadalafil 20 MG tablet  Commonly known as:  CIALIS  Take 1 tablet (20 mg total) by mouth daily as needed for erectile dysfunction.        Allergies: No Known Allergies  No past medical history on file.  No past surgical history on file.  Family History  Problem Relation Age of Onset  . Diabetes Mother   . Diabetes Father     Social History:  reports that he has never smoked. He has never used smokeless tobacco. His alcohol and drug histories are not on file.    Review of Systems        Lipids: treated with Lipitor 20 mg daily, last LDL 72, HDL 36 and triglycerides 92        Lab Results  Component Value Date   CHOL 199 02/18/2015   HDL 44.70 02/18/2015   LDLCALC 120* 02/18/2015   TRIG 173.0* 02/18/2015   CHOLHDL 4 02/18/2015       The blood pressure has been controlled with amlodipine and Zestoretic half tablet daily He has been taking a half tablet Zestoretic with his Invokana He says since last weekend he has felt a little more tired and on Saturday was feeling lightheaded and weak  when he was out walking around he does tend to have a very hot working environment especially in summer  No recent joint pains. No urinary difficulties  His renal function is significantly worse, had been previously normal.  Not taking any ibuprofen or naproxen    Complications: Erectile dysfunction. Cialis 20mg  is effective     diabetic foot exam in 01/2015 showed normal monofilament sensation, Decreased pulses in feet, calluses Rt 1st toe  Physical Examination:  BP 90/60 mmHg  Pulse 94  Temp(Src) 99.1 F (37.3 C)  Resp 16  Ht 6\' 1"   (1.854 m)  Wt 338 lb 3.2 oz (153.407 kg)  BMI 44.63 kg/m2  SpO2 96%  Blood pressure checked 3 times today, standing blood pressure consistently low  No edema     ASSESSMENT:  Diabetes type 2, uncontrolled with BMI 45  He has had improved blood sugar control But still has relatively high readings including in the morning He still has difficulty losing weight and is not doing any exercise on his own as well as may not be consistent with his diet   HYPERTENSION: His blood pressure is unusually low for him This may be result of being more active and exposure to heat as well as continuing both Invokana and Zestoretic This is also cause renal dysfunction recently   RENAL dysfunction: This is likely to be from volume depletion as discussed above from various factors including medications  PLAN:   He will stop his lisinopril and for the moment stop Invokana  Recheck renal function in about a week  If renal function is back to normal he can be switched to 100 mg Invokana and he can go back to 2000 mg of metformin.  Increase Amaryl to 4 mg daily for now  Increase Victoza to 1.8 mg  Increase fluid intake  Monitor blood pressure without lisinopril HCT   Patient Instructions  Check blood sugars on waking up ..  3.. times a week Also check blood sugars about 2 hours after a meal and do this after different meals by rotation Recommended blood sugar levels on waking up is 90-130 and about 2 hours after meal is 140-180 Please bring blood sugar monitor to each visit.  STOP lisinopril completely, stop Invokana Increase Victoza to 1.8 mg  Do not take amlodipine for 3 days  GLIMEPIRIDE: Take 2 tablets of the 2 mg at suppertime daily.  If the blood sugar starts going below 100 reduce the dose to 1 tablet again  Increase fluid intake     Gregory Fitzgerald 04/08/2015, 8:57 PM   Note: This office note was prepared with Estate agent. Any transcriptional errors  that result from this process are unintentional.

## 2015-04-07 NOTE — Patient Instructions (Addendum)
Check blood sugars on waking up ..  3.. times a week Also check blood sugars about 2 hours after a meal and do this after different meals by rotation Recommended blood sugar levels on waking up is 90-130 and about 2 hours after meal is 140-180 Please bring blood sugar monitor to each visit.  STOP lisinopril completely, stop Invokana Increase Victoza to 1.8 mg  Do not take amlodipine for 3 days  GLIMEPIRIDE: Take 2 tablets of the 2 mg at suppertime daily.  If the blood sugar starts going below 100 reduce the dose to 1 tablet again  Increase fluid intake

## 2015-04-14 ENCOUNTER — Other Ambulatory Visit (INDEPENDENT_AMBULATORY_CARE_PROVIDER_SITE_OTHER): Payer: Commercial Managed Care - HMO

## 2015-04-14 ENCOUNTER — Other Ambulatory Visit: Payer: Commercial Managed Care - HMO

## 2015-04-14 DIAGNOSIS — N289 Disorder of kidney and ureter, unspecified: Secondary | ICD-10-CM | POA: Diagnosis not present

## 2015-04-14 LAB — BASIC METABOLIC PANEL
BUN: 17 mg/dL (ref 6–23)
CALCIUM: 9.8 mg/dL (ref 8.4–10.5)
CO2: 30 mEq/L (ref 19–32)
Chloride: 106 mEq/L (ref 96–112)
Creatinine, Ser: 1.01 mg/dL (ref 0.40–1.50)
GFR: 101.77 mL/min (ref >60.00)
Glucose, Bld: 104 mg/dL — ABNORMAL HIGH (ref 70–99)
Potassium: 4.4 mEq/L (ref 3.5–5.1)
SODIUM: 140 meq/L (ref 135–145)

## 2015-04-28 ENCOUNTER — Ambulatory Visit: Payer: Commercial Managed Care - HMO | Admitting: Endocrinology

## 2015-05-04 ENCOUNTER — Other Ambulatory Visit: Payer: Commercial Managed Care - HMO

## 2015-05-05 ENCOUNTER — Other Ambulatory Visit (INDEPENDENT_AMBULATORY_CARE_PROVIDER_SITE_OTHER): Payer: Commercial Managed Care - HMO

## 2015-05-05 ENCOUNTER — Other Ambulatory Visit: Payer: Self-pay | Admitting: *Deleted

## 2015-05-05 DIAGNOSIS — E1100 Type 2 diabetes mellitus with hyperosmolarity without nonketotic hyperglycemic-hyperosmolar coma (NKHHC): Secondary | ICD-10-CM

## 2015-05-05 LAB — BASIC METABOLIC PANEL
BUN: 16 mg/dL (ref 6–23)
CALCIUM: 9.6 mg/dL (ref 8.4–10.5)
CO2: 27 mEq/L (ref 19–32)
CREATININE: 1.09 mg/dL (ref 0.40–1.50)
Chloride: 103 mEq/L (ref 96–112)
GFR: 93.18 mL/min (ref >60.00)
Glucose, Bld: 84 mg/dL (ref 70–99)
Potassium: 4.2 mEq/L (ref 3.5–5.1)
SODIUM: 138 meq/L (ref 135–145)

## 2015-05-05 LAB — HEMOGLOBIN A1C: HEMOGLOBIN A1C: 5.4 % (ref 4.6–6.5)

## 2015-05-07 ENCOUNTER — Ambulatory Visit (INDEPENDENT_AMBULATORY_CARE_PROVIDER_SITE_OTHER): Payer: Commercial Managed Care - HMO | Admitting: Endocrinology

## 2015-05-07 ENCOUNTER — Encounter: Payer: Self-pay | Admitting: Endocrinology

## 2015-05-07 VITALS — BP 149/93 | HR 82 | Temp 98.2°F | Resp 16 | Ht 73.0 in | Wt 328.6 lb

## 2015-05-07 DIAGNOSIS — I10 Essential (primary) hypertension: Secondary | ICD-10-CM | POA: Diagnosis not present

## 2015-05-07 DIAGNOSIS — E119 Type 2 diabetes mellitus without complications: Secondary | ICD-10-CM | POA: Diagnosis not present

## 2015-05-07 DIAGNOSIS — Z23 Encounter for immunization: Secondary | ICD-10-CM | POA: Diagnosis not present

## 2015-05-07 MED ORDER — LISINOPRIL 10 MG PO TABS: 10.0000 mg | ORAL_TABLET | Freq: Every day | ORAL | Status: DC

## 2015-05-07 NOTE — Progress Notes (Signed)
Patient ID: QUE MENEELY, male   DOB: [birth year], 49 y.o.   MRN: [id number]     Reason for Appointment: Follow up forType 2 Diabetes  History of Present Illness:          Diagnosis: Type 2 diabetes mellitus, date of diagnosis: 2006        Past history: At diagnosis he was not having any symptoms and glucose was discovered incidentally when he was found to be hypertensive He has been on metformin for several years and at some point was also taking Amaryl His blood sugars had been significantly high since about 2013 with A1c around 10% On his initial consultation he was taking 1.8 mg Victoza along with metformin ER 1500 mg daily Baseline A1c was 9.7% Because of his obesity he was started on Invokana 100 mg for better control; Amaryl 2 mg was added because of fasting hyperglycemia A1c had come down to 6.6 in October 2015  Recent history:    His blood sugars have been excellent recently and A1c is down to 5.4% which is probably the best he has had Most of the improvement in blood sugar control appears to be from is trying to change his diet significantly as well as starting to exercise at the gym 5 days a week  Current blood sugar patterns and management:  He has checked his blood sugars about 3 times a day consistently 9 they are fairly well controlled throughout the day  On average his blood sugars are slightly higher midday and early afternoon but excellent the rest of the time  When his blood sugars started getting slightly low he stop the Amaryl on his own  Continues to take 1.8 mg Victoza also  His Invokana has been reduced to half tablet and metformin 1500 mg because of previously high creatinine  He has not been compliant with all his medications and refills consistently  He is asking about blood sugars being relatively lower after he exercises in the early evening, however lowest reading is 77   Blood sugars from his FreeStyle monitor show:  Mean values  apply above for all meters except median for One Touch  PRE-MEAL Fasting Lunch Dinner Bedtime Overall  Glucose range:  90-139   102-160   77-140   79-121   77-160   Mean/median:  110      105     Oral hypoglycemic drugs the patient is taking are: Metformin ER, Invokana, Amaryl      Side effects from medications have been: None Compliance with the medical regimen: Fair  Hypoglycemia: Never    Self-care: The diet that the patient has been following is small portions and carbohydrate intake is limited     Meals: 3 meals per day. Breakfast is an egg and toast. His meals are usually  meat and vegetables. He will have snacks with fruit.  Eating out about 3 times a week         Exercise:Gym 5/7 exercise program for the last few weeks  Dietician visit: Most recent: 2008 .   Wt Readings from Last 3 Encounters:  05/07/15 328 lb 9.6 oz (149.052 kg)  04/07/15 338 lb 3.2 oz (153.407 kg)  02/18/15 345 lb 6.4 oz (156.672 kg)      Lab Results  Component Value Date   HGBA1C 5.4 05/05/2015   HGBA1C 8.8* 02/18/2015   HGBA1C 8.9 02/18/2015   Lab Results  Component Value Date   MICROALBUR 2.9* 04/01/2015   [place]  120* 02/18/2015   CREATININE 1.09 05/05/2015                      Medication List       This list is accurate as of: 05/07/15 11:59 PM.  Always use your most recent med list.               amLODipine 10 MG tablet  Commonly known as:  NORVASC  10 mg daily.     aspirin 81 MG tablet  Take 81 mg by mouth daily.     atorvastatin 20 MG tablet  Commonly known as:  LIPITOR     glucose blood test strip  Commonly known as:  FREESTYLE LITE  Use to check blood sugar 3 times per day dx code E11.65     Insulin Pen Needle 32G X 6 MM Misc  Commonly known as:  NOVOFINE  Use 1 per day to inject Victoza     INVOKANA 300 MG Tabs tablet  Generic drug:  canagliflozin  TAKE 1 TABLET BY MOUTH EVERY DAY BEFORE BREAKFAST     Liraglutide 18 MG/3ML Sopn  Commonly known as:   VICTOZA  Inject 0.3 mLs (1.8 mg total) into the skin daily.     lisinopril 10 MG tablet  Commonly known as:  ZESTRIL  Take 1 tablet (10 mg total) by mouth daily.     metFORMIN 500 MG 24 hr tablet  Commonly known as:  GLUCOPHAGE-XR  TAKE 4 TABLETS ONCE A DAY ORALLY *NEEDS OFFICE VISIT*     tadalafil 20 MG tablet  Commonly known as:  CIALIS  Take 1 tablet (20 mg total) by mouth daily as needed for erectile dysfunction.        Allergies: No Known Allergies  No past medical history on file.  No past surgical history on file.  Family History  Problem Relation Age of Onset  . Diabetes Mother   . Diabetes Father     Social History:  reports that he has never smoked. He has never used smokeless tobacco. His alcohol and drug histories are not on file.    Review of Systems        Lipids: treated with Lipitor 20 mg daily, last LDL 72, HDL 36 and triglycerides 92        Lab Results  Component Value Date   CHOL 199 02/18/2015   HDL 44.70 02/18/2015   LDLCALC 120* 02/18/2015   TRIG 173.0* 02/18/2015   CHOLHDL 4 02/18/2015       The blood pressure has been not controlled with amlodipine only now Because of his relatively low blood pressure and increased creatinine his lisinopril and lisinopril HCT were stopped  Does not check blood pressure at home    Complications: Erectile dysfunction. Cialis 20mg  is effective     diabetic foot exam in 01/2015 showed normal monofilament sensation, Decreased pulses in feet, calluses Rt 1st toe  Physical Examination:  BP 149/93 mmHg  Pulse 82  Temp(Src) 98.2 F (36.8 C)  Resp 16  Ht 6\' 1"  (1.854 m)  Wt 328 lb 9.6 oz (149.052 kg)  BMI 43.36 kg/m2  SpO2 93%  Repeat blood pressure was almost the same with large cuff No edema     ASSESSMENT:  Diabetes type 2, uncontrolled with BMI 45  He has had improved blood sugar control with getting more motivation to watch his diet and exercise regularly which he had not been doing  before He has been  able to stop his Amaryl and his blood sugars are controlled with Victoza, metformin and 150 mg Invokana  A1c is in the normal range and most of his blood sugars at home including postprandial are normal   HYPERTENSION:  His blood pressure is going up with stopping lisinopril HCT Since he had previously done well with lisinopril for quite some time this may be safe for him to try with a low dose without any diuretic while taking Invokana   RENAL dysfunction: Resolved, This was likely to be from volume depletion previously  PLAN:   He will restart lisinopril 10 mg daily  Continue monitoring blood pressure and renal function  Influenza vaccine given  Chlamydia reduced the frequency of his glucose monitoring now  [facility] 05/09/2015, 9:07 PM   Note: This office note was prepared with Estate agent. Any transcriptional errors that result from this process are unintentional.

## 2015-06-28 ENCOUNTER — Other Ambulatory Visit: Payer: Self-pay | Admitting: *Deleted

## 2015-06-28 MED ORDER — METFORMIN HCL ER 500 MG PO TB24: ORAL_TABLET | ORAL | Status: DC

## 2015-07-06 ENCOUNTER — Other Ambulatory Visit: Payer: Commercial Managed Care - HMO

## 2015-07-09 ENCOUNTER — Ambulatory Visit (INDEPENDENT_AMBULATORY_CARE_PROVIDER_SITE_OTHER): Payer: Commercial Managed Care - HMO | Admitting: Endocrinology

## 2015-07-09 ENCOUNTER — Encounter: Payer: Self-pay | Admitting: Endocrinology

## 2015-07-09 VITALS — BP 118/76 | HR 95 | Temp 98.0°F | Resp 16 | Ht 73.0 in | Wt 300.0 lb

## 2015-07-09 DIAGNOSIS — E119 Type 2 diabetes mellitus without complications: Secondary | ICD-10-CM

## 2015-07-09 DIAGNOSIS — E7800 Pure hypercholesterolemia, unspecified: Secondary | ICD-10-CM | POA: Diagnosis not present

## 2015-07-09 DIAGNOSIS — M7918 Myalgia, other site: Secondary | ICD-10-CM

## 2015-07-09 DIAGNOSIS — I10 Essential (primary) hypertension: Secondary | ICD-10-CM | POA: Diagnosis not present

## 2015-07-09 DIAGNOSIS — M791 Myalgia: Secondary | ICD-10-CM

## 2015-07-09 LAB — COMPREHENSIVE METABOLIC PANEL
ALBUMIN: 4.2 g/dL (ref 3.5–5.2)
ALT: 14 U/L (ref 0–53)
AST: 23 U/L (ref 0–37)
Alkaline Phosphatase: 53 U/L (ref 39–117)
BUN: 19 mg/dL (ref 6–23)
CHLORIDE: 104 meq/L (ref 96–112)
CO2: 27 mEq/L (ref 19–32)
CREATININE: 1.04 mg/dL (ref 0.40–1.50)
Calcium: 10.4 mg/dL (ref 8.4–10.5)
GFR: 98.29 mL/min (ref >60.00)
GLUCOSE: 90 mg/dL (ref 70–99)
Potassium: 4.5 mEq/L (ref 3.5–5.1)
SODIUM: 140 meq/L (ref 135–145)
Total Bilirubin: 1.3 mg/dL — ABNORMAL HIGH (ref 0.2–1.2)
Total Protein: 7.8 g/dL (ref 6.0–8.3)

## 2015-07-09 LAB — VITAMIN D 25 HYDROXY (VIT D DEFICIENCY, FRACTURES): VITD: 25.85 ng/mL — ABNORMAL LOW (ref 30.00–100.00)

## 2015-07-09 LAB — TSH: TSH: 1.34 u[IU]/mL (ref 0.35–4.50)

## 2015-07-09 LAB — MAGNESIUM: MAGNESIUM: 2 mg/dL (ref 1.5–2.5)

## 2015-07-09 LAB — CK: CK TOTAL: 244 U/L — AB (ref 7–232)

## 2015-07-09 NOTE — Patient Instructions (Signed)
Check blood sugars on waking up 3  times a week Also check blood sugars about 2 hours after a meal and do this after different meals by rotation  Recommended blood sugar levels on waking up is 90-130 and about 2 hours after meal is 130-160  Please bring your blood sugar monitor to each visit, thank you  Leave off Lipitor 2 weeks

## 2015-07-09 NOTE — Progress Notes (Signed)
Patient ID: Gregory Fitzgerald, male   DOB: 10/16/1965, 49 y.o.   MRN: [id number]     Reason for Appointment: Follow up forType 2 Diabetes  History of Present Illness:          Diagnosis: Type 2 diabetes mellitus, date of diagnosis: 2006        Past history: At diagnosis he was not having any symptoms and glucose was discovered incidentally when he was found to be hypertensive He has been on metformin for several years and at some point was also taking Amaryl His blood sugars had been significantly high since about 2013 with A1c around 10% On his initial consultation he was taking 1.8 mg Victoza along with metformin ER 1500 mg daily Baseline A1c was 9.7% Because of his obesity he was started on Invokana 100 mg for better control; Amaryl 2 mg was added because of fasting hyperglycemia A1c had come down to 6.6 in October 2015  Recent history:   Non-insulin hypoglycemic drugs the patient is taking are: Metformin ER, Invokana, Victoza  1.8 mg daily       His blood sugars have been excellent recently and A1c is down to 5.4% which is probably the best he has had Most of the improvement in blood sugar control appears to be from is trying to change his diet significantly as well as starting to exercise at the gym 5 days a week  Current blood sugar patterns and management:  He did not bring his monitor for download and not clear what his blood sugar patterns are  However he believes that his blood sugars are all below 100 at various times, previously had to check them after meals also  He also has not had any hypoglycemic symptoms  He had been fairly consistent with his exercise again which he had started regularly in late summer   had lost a significant amount of weight, mostly from cutting back on portions and carbohydrates  Side effects from medications have been: None Compliance with the medical regimen: Fair  Hypoglycemia: Never    Self-care: The diet that the patient has  been following is small portions and carbohydrate intake is limited     Meals: 3 meals per day. Breakfast is an egg and toast. His meals are usually  meat and vegetables. He will have snacks with fruit.  Eating out about 3 times a week         Exercise:Gym 5/7 exercise program for the last few weeks  Dietician visit: Most recent: 2008 .   Wt Readings from Last 3 Encounters:  07/09/15 300 lb (136.079 kg)  05/07/15 328 lb 9.6 oz (149.052 kg)  04/07/15 338 lb 3.2 oz (153.407 kg)      Lab Results  Component Value Date   HGBA1C 5.4 05/05/2015   HGBA1C 8.8* 02/18/2015   HGBA1C 8.9 02/18/2015   Lab Results  Component Value Date   MICROALBUR 2.9* 04/01/2015   LDLCALC 120* 02/18/2015   CREATININE 1.09 05/05/2015       OTHER active problems are discussed in review of systems                 Medication List       This list is accurate as of: 07/09/15  3:15 PM.  Always use your most recent med list.               amLODipine 10 MG tablet  Commonly known as:  NORVASC  10 mg daily.  aspirin 81 MG tablet  Take 81 mg by mouth daily.     atorvastatin 20 MG tablet  Commonly known as:  LIPITOR     glucose blood test strip  Commonly known as:  FREESTYLE LITE  Use to check blood sugar 3 times per day dx code E11.65     Insulin Pen Needle 32G X 6 MM Misc  Commonly known as:  NOVOFINE  Use 1 per day to inject Victoza     INVOKANA 300 MG Tabs tablet  Generic drug:  canagliflozin  TAKE 1 TABLET BY MOUTH EVERY DAY BEFORE BREAKFAST     Liraglutide 18 MG/3ML Sopn  Commonly known as:  VICTOZA  Inject 0.3 mLs (1.8 mg total) into the skin daily.     lisinopril 10 MG tablet  Commonly known as:  ZESTRIL  Take 1 tablet (10 mg total) by mouth daily.     metFORMIN 500 MG 24 hr tablet  Commonly known as:  GLUCOPHAGE-XR  TAKE 4 TABLETS ONCE A DAY ORALLY     tadalafil 20 MG tablet  Commonly known as:  CIALIS  Take 1 tablet (20 mg total) by mouth daily as needed for erectile  dysfunction.        Allergies: No Known Allergies  No past medical history on file.  No past surgical history on file.  Family History  Problem Relation Age of Onset  . Diabetes Mother   . Diabetes Father     Social History:  reports that he has never smoked. He has never used smokeless tobacco. His alcohol and drug histories are not on file.    Review of Systems   Today's asking about his legs feeling somewhat stiff when he first gets up after sitting for long or getting up in the morning. This is not a significant discomfort but he has to walk a little relieve the stiffness his legs and walk properly although does not have any gait imbalance, numbness or other sensory symptoms.  No muscle aches or joint pains otherwise and he is also able to exercise significantly 5 days a week, has not increased the duration much He has been on Lipitor for several years without any sugars previously      Lipids: treated with Lipitor 20 mg daily, last levels as follows:      Lab Results  Component Value Date   CHOL 199 02/18/2015   HDL 44.70 02/18/2015   LDLCALC 120* 02/18/2015   TRIG 173.0* 02/18/2015   CHOLHDL 4 02/18/2015       The blood pressure has been not controlled with amlodipine and 10 mg lisinopril No lightheadedness  Does not check blood pressure at home    Complications: Erectile dysfunction. Cialis 20mg  is effective     diabetic foot exam in 01/2015 showed normal monofilament sensation, Decreased pulses in feet, calluses Rt 1st toe  Physical Examination:  BP 118/76 mmHg  Pulse 95  Temp(Src) 98 F (36.7 C)  Resp 16  Ht 6\' 1"  (1.854 m)  Wt 300 lb (136.079 kg)  BMI 39.59 kg/m2  SpO2 96%  Repeat blood pressure  with large cuff standing is 118/82 No edema     ASSESSMENT:  Diabetes type 2, uncontrolled with BMI  39.6   He has had  significant amount of weight loss with his improving his diet and being consistent with exercise in the last few weeks His A1c is  also excellent at 5.4, previously has been as high as 8.9 He continues to  be quite motivated and wants to lose more weight Invokana has been reduced to half a tablet previously because of changes in renal function    LEG stiffness: Etiology is unclear and reassured him that it appears not to be related to diabetes or vascular disease and maybe due to his significant weight loss However in case he is having mild myopathy will check his CPK and also hold off his Lipitor for 2 weeks as a trial, will recheck chemistries and vitamin D level also   HYPERTENSION:  His blood pressure is  well-controlled with low-dose lisinopril and amlodipine We'll continue to monitor the pressure periodically and may adjust doses as needed  LIPIDS: Last LDL was 120 and may be improved with his changed diet and weight loss   PLAN:   He will continue same regimen  Consider reducing dose of Victoza  Follow-up in 3 months with labs including lipids  Evaluation of leg stiffness as above  Total visit time including evaluation of multiple problems, Labs and counseling = 25 minutes  Inis Borneman 07/09/2015, 3:15 PM   Note: This office note was prepared with Estate agent. Any transcriptional errors that result from this process are unintentional.  Labs show mild decrease in vitamin D and marked increase in CPK which is expected for his level of activity Will have him supplement vitamin D3

## 2015-07-10 NOTE — Progress Notes (Signed)
Quick Note:  Please let patient know that the lab results are normal, vitamin D is low normal, start OTC vitamin D 3, 2000 units daily Also hold off on Lipitor for 2 weeks as discussed, fax to PCP ______

## 2015-08-06 ENCOUNTER — Other Ambulatory Visit: Payer: Self-pay | Admitting: Endocrinology

## 2015-10-02 ENCOUNTER — Other Ambulatory Visit: Payer: Self-pay | Admitting: Endocrinology

## 2015-10-04 ENCOUNTER — Other Ambulatory Visit: Payer: Commercial Managed Care - HMO

## 2015-10-05 ENCOUNTER — Other Ambulatory Visit (INDEPENDENT_AMBULATORY_CARE_PROVIDER_SITE_OTHER): Payer: Commercial Managed Care - HMO

## 2015-10-05 DIAGNOSIS — E7800 Pure hypercholesterolemia, unspecified: Secondary | ICD-10-CM | POA: Diagnosis not present

## 2015-10-05 DIAGNOSIS — E119 Type 2 diabetes mellitus without complications: Secondary | ICD-10-CM

## 2015-10-05 LAB — HEMOGLOBIN A1C: Hgb A1c MFr Bld: 4.8 % (ref 4.6–6.5)

## 2015-10-05 LAB — BASIC METABOLIC PANEL
BUN: 20 mg/dL (ref 6–23)
CHLORIDE: 106 meq/L (ref 96–112)
CO2: 26 meq/L (ref 19–32)
CREATININE: 0.94 mg/dL (ref 0.40–1.50)
Calcium: 9.7 mg/dL (ref 8.4–10.5)
GFR: 110.34 mL/min (ref >60.00)
GLUCOSE: 92 mg/dL (ref 70–99)
Potassium: 4.2 mEq/L (ref 3.5–5.1)
Sodium: 141 mEq/L (ref 135–145)

## 2015-10-05 LAB — LIPID PANEL
CHOL/HDL RATIO: 3
Cholesterol: 157 mg/dL (ref 0–200)
HDL: 52.2 mg/dL (ref >39.00)
LDL CALC: 90 mg/dL (ref 0–99)
NONHDL: 104.4
Triglycerides: 72 mg/dL (ref 0.0–149.0)
VLDL: 14.4 mg/dL (ref 0.0–40.0)

## 2015-10-07 ENCOUNTER — Ambulatory Visit (INDEPENDENT_AMBULATORY_CARE_PROVIDER_SITE_OTHER): Payer: Commercial Managed Care - HMO | Admitting: Endocrinology

## 2015-10-07 ENCOUNTER — Encounter: Payer: Self-pay | Admitting: Endocrinology

## 2015-10-07 VITALS — BP 115/80 | HR 90 | Temp 98.0°F | Resp 14 | Ht 73.0 in | Wt 279.8 lb

## 2015-10-07 DIAGNOSIS — E119 Type 2 diabetes mellitus without complications: Secondary | ICD-10-CM | POA: Diagnosis not present

## 2015-10-07 NOTE — Patient Instructions (Signed)
Check blood sugars on waking up 2  times a week Also check blood sugars about 2 hours after a meal and do this after different meals by rotation  Recommended blood sugar levels on waking up is 90-130 and about 2 hours after meal is 130-160  Please bring your blood sugar monitor to each visit, thank you  Stop Victoza

## 2015-10-07 NOTE — Progress Notes (Signed)
Patient ID: Gregory Fitzgerald, male   DOB: Jan 31, 1966, 50 y.o.   MRN: [id number]     Reason for Appointment: Follow up forType 2 Diabetes  History of Present Illness:          Diagnosis: Type 2 diabetes mellitus, date of diagnosis: 2006        Past history: At diagnosis he was not having any symptoms and glucose was discovered incidentally when he was found to be hypertensive He has been on metformin for several years and at some point was also taking Amaryl His blood sugars had been significantly high since about 2013 with A1c around 10% On his initial consultation he was taking 1.8 mg Victoza along with metformin ER 1500 mg daily Baseline A1c was 9.7% Because of his obesity he was started on Invokana 100 mg for better control; Amaryl 2 mg was added because of fasting hyperglycemia A1c had come down to 6.6 in October 2015  Recent history:   Non-insulin hypoglycemic drugs the patient is taking are: Metformin ER, Invokana, Victoza  1.8 mg daily       His blood sugars have been excellent recently and A1c is down further to 4.8 He continues to lose weight with significant changes in lifestyle  Current blood sugar patterns and management:  He appears to have fairly normal blood sugars especially in the afternoons and evenings  He continues to take the 3 drug regimen  With cutting back on portions and carbohydrates he is continuing to lose weight  He has been very consistent with exercise at the gym  Side effects from medications have been: None Compliance with the medical regimen: Fair  Hypoglycemia: Never    GLUCOSE readings from monitor download: Mean values apply above for all meters except median for One Touch  PRE-MEAL Fasting Lunch Dinner PCS  Overall  Glucose range: 83-109   81-84  79-96    Mean/median: 94     91     Self-care: The diet that the patient has been following is small portions and carbohydrate intake is limited     Meals: 3 meals per day.  Breakfast is a cereal.  His meals are usually  meat and vegetables. He will have snacks with fruit.  Less beef, more chicken and seafood         Exercise:Gym 5/7 exercise program, elliptical, walking  Dietician visit: Most recent: 2008 .   Wt Readings from Last 3 Encounters:  10/07/15 279 lb 12.8 oz (126.916 kg)  07/09/15 300 lb (136.079 kg)  05/07/15 328 lb 9.6 oz (149.052 kg)      Lab Results  Component Value Date   HGBA1C 4.8 10/05/2015   HGBA1C 5.4 05/05/2015   HGBA1C 8.8* 02/18/2015   Lab Results  Component Value Date   MICROALBUR 2.9* 04/01/2015   LDLCALC 90 10/05/2015   CREATININE 0.94 10/05/2015       OTHER active problems are discussed in review of systems                 Medication List       This list is accurate as of: 10/07/15 11:59 PM.  Always use your most recent med list.               amLODipine 10 MG tablet  Commonly known as:  NORVASC  10 mg daily. Reported on 10/07/2015     aspirin 81 MG tablet  Take 81 mg by mouth daily.     atorvastatin  20 MG tablet  Commonly known as:  LIPITOR  Reported on 10/07/2015     glucose blood test strip  Commonly known as:  FREESTYLE LITE  Use to check blood sugar 3 times per day dx code E11.65     Insulin Pen Needle 32G X 6 MM Misc  Commonly known as:  NOVOFINE  Use 1 per day to inject Victoza     INVOKANA 300 MG Tabs tablet  Generic drug:  canagliflozin  TAKE 1 TABLET BY MOUTH EVERY DAY BEFORE BREAKFAST     lisinopril 10 MG tablet  Commonly known as:  PRINIVIL,ZESTRIL  TAKE 1 TABLET (10 MG TOTAL) BY MOUTH DAILY.     metFORMIN 500 MG 24 hr tablet  Commonly known as:  GLUCOPHAGE-XR  TAKE 4 TABLETS ONCE A DAY ORALLY     tadalafil 20 MG tablet  Commonly known as:  CIALIS  Take 1 tablet (20 mg total) by mouth daily as needed for erectile dysfunction.     VICTOZA 18 MG/3ML Sopn  Generic drug:  Liraglutide  INJECT 0.3 MLS (1.8 MG TOTAL) INTO THE SKIN DAILY.        Allergies: No Known  Allergies  No past medical history on file.  No past surgical history on file.  Family History  Problem Relation Age of Onset  . Diabetes Mother   . Diabetes Father     Social History:  reports that he has never smoked. He has never used smokeless tobacco. His alcohol and drug histories are not on file.    Review of Systems   On his last visit he was complaining about leg stiffness, this is resolving now, no etiology found      Lipids: treated with Lipitor 20 mg previously but this was stopped on his last visit because of leg stiffness; however his LDL is still below 100 without medications      Lab Results  Component Value Date   CHOL 157 10/05/2015   HDL 52.20 10/05/2015   LDLCALC 90 10/05/2015   TRIG 72.0 10/05/2015   CHOLHDL 3 10/05/2015       The blood pressure has been not controlled with amlodipine and 10 mg lisinopril No lightheadedness    Complications: Erectile dysfunction. Treated with Cialis 20mg     diabetic foot exam in 01/2015 showed normal monofilament sensation, Decreased pulses in feet, calluses Rt 1st toe  Physical Examination:  BP 115/80 mmHg  Pulse 90  Temp(Src) 98 F (36.7 C)  Resp 14  Ht 6\' 1"  (1.854 m)  Wt 279 lb 12.8 oz (126.916 kg)  BMI 36.92 kg/m2  SpO2 96%      ASSESSMENT:  Diabetes type 2, uncontrolled with BMI  39.6   He has had  significant amount of weight loss with his significantly changing his diet and being consistent with exercise in the last few months His A1c is now in the known diabetic range at 4.8, previously has been as high as 8.9 He continues to be quite motivated and is consistent with his exercise program along with heart healthy and low carbohydrate diet Invokana has been reduced to half a tablet previously because of changes in renal function   HYPERTENSION:  His blood pressure is  well-controlled with low-dose lisinopril and amlodipine  LIPIDS: Last LDL was 90 without medication and he can continue to be  observed on no treatment for now   PLAN:   He will leave off Victoza continue same regimen  May also consider stopping Invokana  on the next visit  He will check blood sugars by rotation at various times of the day  [facility] 10/08/2015, 12:44 PM   Note: This office note was prepared with Estate agent. Any transcriptional errors that result from this process are unintentional.  Labs show mild decrease in vitamin D and marked increase in CPK which is expected for his level of activity Will have him supplement vitamin D3

## 2015-11-11 ENCOUNTER — Other Ambulatory Visit: Payer: Self-pay | Admitting: Endocrinology

## 2015-11-13 ENCOUNTER — Other Ambulatory Visit: Payer: Self-pay | Admitting: Endocrinology

## 2015-12-23 ENCOUNTER — Other Ambulatory Visit: Payer: Self-pay | Admitting: Endocrinology

## 2016-01-21 ENCOUNTER — Other Ambulatory Visit: Payer: Self-pay

## 2016-01-21 MED ORDER — TADALAFIL 20 MG PO TABS: 20.0000 mg | ORAL_TABLET | Freq: Every day | ORAL | Status: DC | PRN

## 2016-01-24 ENCOUNTER — Other Ambulatory Visit: Payer: Self-pay

## 2016-01-24 MED ORDER — TADALAFIL 20 MG PO TABS: 20.0000 mg | ORAL_TABLET | Freq: Every day | ORAL | Status: DC | PRN

## 2016-02-07 ENCOUNTER — Ambulatory Visit: Payer: Commercial Managed Care - HMO | Admitting: Endocrinology

## 2016-02-11 ENCOUNTER — Ambulatory Visit (INDEPENDENT_AMBULATORY_CARE_PROVIDER_SITE_OTHER): Payer: Commercial Managed Care - HMO | Admitting: Endocrinology

## 2016-02-11 ENCOUNTER — Encounter: Payer: Self-pay | Admitting: Endocrinology

## 2016-02-11 VITALS — BP 112/74 | HR 71 | Ht 73.0 in | Wt 275.0 lb

## 2016-02-11 DIAGNOSIS — E119 Type 2 diabetes mellitus without complications: Secondary | ICD-10-CM

## 2016-02-11 LAB — COMPREHENSIVE METABOLIC PANEL
ALBUMIN: 4.2 g/dL (ref 3.5–5.2)
ALK PHOS: 55 U/L (ref 39–117)
ALT: 17 U/L (ref 0–53)
AST: 20 U/L (ref 0–37)
BILIRUBIN TOTAL: 0.8 mg/dL (ref 0.2–1.2)
BUN: 22 mg/dL (ref 6–23)
CALCIUM: 9.7 mg/dL (ref 8.4–10.5)
CHLORIDE: 106 meq/L (ref 96–112)
CO2: 27 mEq/L (ref 19–32)
CREATININE: 0.82 mg/dL (ref 0.40–1.50)
GFR: 128.99 mL/min (ref >60.00)
Glucose, Bld: 122 mg/dL — ABNORMAL HIGH (ref 70–99)
Potassium: 4.2 mEq/L (ref 3.5–5.1)
SODIUM: 139 meq/L (ref 135–145)
TOTAL PROTEIN: 7.2 g/dL (ref 6.0–8.3)

## 2016-02-11 LAB — MICROALBUMIN / CREATININE URINE RATIO
CREATININE, U: 51.8 mg/dL
MICROALB UR: 10.9 mg/dL — AB (ref 0.0–1.9)
Microalb Creat Ratio: 21 mg/g (ref 0.0–30.0)

## 2016-02-11 LAB — POCT GLYCOSYLATED HEMOGLOBIN (HGB A1C): Hemoglobin A1C: 4.9

## 2016-02-11 MED ORDER — EMPAGLIFLOZIN 10 MG PO TABS: 10.0000 mg | ORAL_TABLET | Freq: Every day | ORAL | Status: DC

## 2016-02-11 NOTE — Progress Notes (Signed)
Patient ID: NALU LABA, male   DOB: May 09, 1966, 50 y.o.   MRN: [id number]     Reason for Appointment: Follow up forType 2 Diabetes  History of Present Illness:          Diagnosis: Type 2 diabetes mellitus, date of diagnosis: 2006        Past history: At diagnosis he was not having any symptoms and glucose was discovered incidentally when he was found to be hypertensive He has been on metformin for several years and at some point was also taking Amaryl His blood sugars had been significantly high since about 2013 with A1c around 10% On his initial consultation he was taking 1.8 mg Victoza along with metformin ER 1500 mg daily Baseline A1c was 9.7% Because of his obesity he was started on Invokana 100 mg for better control; Amaryl 2 mg was added because of fasting hyperglycemia A1c had come down to 6.6 in October 2015  Recent history:   Non-insulin hypoglycemic drugs the patient is taking are: Metformin ER, Invokana     His blood sugars have been excellent recently and A1c is stable in the normal range, now 4.9 He continues to lose weight with significant changes in lifestyle, recently not as much because of medication  Current blood sugar patterns and management:  He appears to have fairly normal blood sugars although he did not bring his monitor for download  Victoza was stopped on his last visit and blood sugars are not any higher  Still fairly consistent with his diet with modifying portions and carbohydrates  He has been  consistent with exercise at the gym  Side effects from medications have been: None Compliance with the medical regimen: Excellent  Hypoglycemia: Never    GLUCOSE readings from 97-110 by recall, some after meals  Self-care: The diet that the patient has been following is small portions and carbohydrate intake is limited     Meals: 3 meals per day. Breakfast is a cereal.  His meals are usually  meat and vegetables. He will have snacks with  fruit.  Less beef, more chicken and seafood         Exercise: Gym 4/7 exercise program, elliptical, walking  Dietician visit: Most recent: 2008 .   Wt Readings from Last 3 Encounters:  02/11/16 275 lb (124.739 kg)  10/07/15 279 lb 12.8 oz (126.916 kg)  07/09/15 300 lb (136.079 kg)      Lab Results  Component Value Date   HGBA1C 4.9 02/11/2016   HGBA1C 4.8 10/05/2015   HGBA1C 5.4 05/05/2015   Lab Results  Component Value Date   MICROALBUR 2.9* 04/01/2015   LDLCALC 90 10/05/2015   CREATININE 0.94 10/05/2015       OTHER active problems are discussed in review of systems                 Medication List       This list is accurate as of: 02/11/16  1:12 PM.  Always use your most recent med list.               amLODipine 10 MG tablet  Commonly known as:  NORVASC  10 mg daily. Reported on 02/11/2016     aspirin 81 MG tablet  Take 81 mg by mouth daily.     atorvastatin 20 MG tablet  Commonly known as:  LIPITOR  Reported on 02/11/2016     empagliflozin 10 MG Tabs tablet  Commonly known as:  JARDIANCE  Take 10 mg by mouth daily with breakfast.     glucose blood test strip  Commonly known as:  FREESTYLE LITE  Use to check blood sugar 3 times per day dx code E11.65     Insulin Pen Needle 32G X 6 MM Misc  Commonly known as:  NOVOFINE  Use 1 per day to inject Victoza     lisinopril 10 MG tablet  Commonly known as:  PRINIVIL,ZESTRIL  TAKE 1 TABLET (10 MG TOTAL) BY MOUTH DAILY.     metFORMIN 500 MG 24 hr tablet  Commonly known as:  GLUCOPHAGE-XR  TAKE 4 TABLETS ONCE A DAY ORALLY     tadalafil 20 MG tablet  Commonly known as:  CIALIS  Take 1 tablet (20 mg total) by mouth daily as needed for erectile dysfunction.     VICTOZA 18 MG/3ML Sopn  Generic drug:  Liraglutide  INJECT 0.3 MLS (1.8 MG TOTAL) INTO THE SKIN DAILY.        Allergies: No Known Allergies  No past medical history on file.  No past surgical history on file.  Family History  Problem  Relation Age of Onset  . Diabetes Mother   . Diabetes Father     Social History:  reports that he has never smoked. He has never used smokeless tobacco. His alcohol and drug histories are not on file.    Review of Systems        Lipids: treated with Lipitor 20 mg previously but this was stopped empirically because of leg stiffness; however his LDL is still below 100 without medications      Lab Results  Component Value Date   CHOL 157 10/05/2015   HDL 52.20 10/05/2015   LDLCALC 90 10/05/2015   TRIG 72.0 10/05/2015   CHOLHDL 3 10/05/2015       The blood pressure has been controlled with amlodipine and 10 mg lisinopril No lightheadedness    Complications: Erectile dysfunction. Treated with Cialis 20mg     diabetic foot exam in 01/2016 showed normal monofilament sensation, Decreased pulses in feet, calluses Rt 1st toe  Physical Examination:  BP 112/74 mmHg  Pulse 71  Ht 6\' 1"  (1.854 m)  Wt 275 lb (124.739 kg)  BMI 36.29 kg/m2  SpO2 98%  Diabetic Foot Exam - Simple   Simple Foot Form  Diabetic Foot exam was performed with the following findings:  Yes   Visual Inspection  No deformities, no ulcerations, no other skin breakdown bilaterally:  Yes  Sensation Testing  Intact to touch and monofilament testing bilaterally:  Yes  Pulse Check  Posterior Tibialis and Dorsalis pulse intact bilaterally:  Yes  See comments:  Yes  Comments  Decreased left pedal pulses List formation distal right foot midline         ASSESSMENT:  Diabetes type 2, uncontrolled with BMI  39.6   He has had  significant amount of weight loss with his significantly changing his diet and being consistent with exercise His A1c is now Consistently in the known diabetic range at 4.9, previously has been as high as 8.9 He continues to be quite motivated and is consistent with his exercise program His weight is fairly good although has not lost as much recently because of vacation  HYPERTENSION:  His  blood pressure is  well-controlled with low-dose lisinopril and amlodipine, Also benefiting from Invokana  Foot exam normal today except decreased palpable pulses in the left foot   PLAN:   He will switch to [employer]  10 mg daily because of insurance preference  Continue metformin  Encouraged him to continue his diet and exercise regimen  He will check blood sugars by rotation at various times of the day and bring monitor on each visit  Jenness Stemler 02/11/2016, 1:12 PM   Note: This office note was prepared with Estate agent. Any transcriptional errors that result from this process are unintentional.

## 2016-03-03 ENCOUNTER — Other Ambulatory Visit: Payer: Self-pay | Admitting: Endocrinology

## 2016-03-04 ENCOUNTER — Other Ambulatory Visit: Payer: Self-pay | Admitting: Endocrinology

## 2016-04-04 ENCOUNTER — Other Ambulatory Visit: Payer: Self-pay | Admitting: Endocrinology

## 2016-04-19 ENCOUNTER — Other Ambulatory Visit: Payer: Self-pay | Admitting: Endocrinology

## 2016-04-28 ENCOUNTER — Other Ambulatory Visit: Payer: Self-pay | Admitting: Endocrinology

## 2016-06-08 ENCOUNTER — Other Ambulatory Visit (INDEPENDENT_AMBULATORY_CARE_PROVIDER_SITE_OTHER): Payer: Commercial Managed Care - HMO

## 2016-06-08 DIAGNOSIS — E119 Type 2 diabetes mellitus without complications: Secondary | ICD-10-CM | POA: Diagnosis not present

## 2016-06-08 LAB — BASIC METABOLIC PANEL
BUN: 32 mg/dL — ABNORMAL HIGH (ref 6–23)
CHLORIDE: 104 meq/L (ref 96–112)
CO2: 26 meq/L (ref 19–32)
Calcium: 9.7 mg/dL (ref 8.4–10.5)
Creatinine, Ser: 1.13 mg/dL (ref 0.40–1.50)
GFR: 88.97 mL/min (ref >60.00)
Glucose, Bld: 95 mg/dL (ref 70–99)
POTASSIUM: 4.4 meq/L (ref 3.5–5.1)
SODIUM: 138 meq/L (ref 135–145)

## 2016-06-08 LAB — HEMOGLOBIN A1C: HEMOGLOBIN A1C: 5 % (ref 4.6–6.5)

## 2016-06-13 ENCOUNTER — Ambulatory Visit (INDEPENDENT_AMBULATORY_CARE_PROVIDER_SITE_OTHER): Payer: Commercial Managed Care - HMO | Admitting: Endocrinology

## 2016-06-13 ENCOUNTER — Encounter: Payer: Self-pay | Admitting: Endocrinology

## 2016-06-13 VITALS — BP 110/78 | HR 74 | Temp 98.8°F | Resp 16 | Ht 73.0 in | Wt 280.5 lb

## 2016-06-13 DIAGNOSIS — Z23 Encounter for immunization: Secondary | ICD-10-CM | POA: Diagnosis not present

## 2016-06-13 DIAGNOSIS — E119 Type 2 diabetes mellitus without complications: Secondary | ICD-10-CM

## 2016-06-13 NOTE — Progress Notes (Signed)
Patient ID: Gregory Fitzgerald, male   DOB: 04-Aug-1965, 50 y.o.   MRN: [id number]     Reason for Appointment: Follow up forType 2 Diabetes  History of Present Illness:          Diagnosis: Type 2 diabetes mellitus, date of diagnosis: 2006        Past history: At diagnosis he was not having any symptoms and glucose was discovered incidentally when he was found to be hypertensive He has been on metformin for several years and at some point was also taking Amaryl His blood sugars had been significantly high since about 2013 with A1c around 10% On his initial consultation he was taking 1.8 mg Victoza along with metformin ER 1500 mg daily Baseline A1c was 9.7% Because of his obesity he was started on Invokana 100 mg for better control; Amaryl 2 mg was added because of fasting hyperglycemia A1c had come down to 6.6 in October 2015  Recent history:   Non-insulin hypoglycemic drugs the patient is taking are: Metformin ER 1.5g, Jardiance 10 mg daily      His blood sugars have been excellent recently and A1c is stable in the normal range, now 5   Current blood sugar patterns and management:  He has checked blood sugar very sporadically and mostly in the mornings  He was changed from [place] to [place] because of insurance preference  He has gained some weight, he thinks this is from recent vacation  Overall has been trying to be active although not doing as much formal exercise at the gym as before  Generally he tries to eat a low carbohydrate low fat diet fairly consistently and avoiding snacks with carbohydrates  Side effects from medications have been: None  GLUCOSE readings from download: FASTING range 86-110, evening 82-96  Self-care: The diet that the patient has been following is small portions and carbohydrate intake is limited     Meals: 3 meals per day. Breakfast is a cereal.  His meals are usually  meat and vegetables. He will have snacks with tuna.  Less beef,  more chicken and seafood         Exercise: Gym 4/7 exercise program, elliptical, walking  Dietician visit: Most recent: 2008 .   Wt Readings from Last 3 Encounters:  06/13/16 280 lb 8 oz (127.2 kg)  02/11/16 275 lb (124.7 kg)  10/07/15 279 lb 12.8 oz (126.9 kg)      Lab Results  Component Value Date   HGBA1C 5.0 06/08/2016   HGBA1C 4.9 02/11/2016   HGBA1C 4.8 10/05/2015   Lab Results  Component Value Date   MICROALBUR 10.9 (H) 02/11/2016   LDLCALC 90 10/05/2015   CREATININE 1.13 06/08/2016       OTHER active problems are discussed in review of systems                 Medication List       Accurate as of 06/13/16  9:15 PM. Always use your most recent med list.          aspirin 81 MG tablet Take 81 mg by mouth daily.   atorvastatin 20 MG tablet Commonly known as:  LIPITOR Reported on 02/11/2016   CIALIS 20 MG tablet Generic drug:  tadalafil TAKE 1 TABLET EVERY DAY AS NEEDED FOR ERECTILE DYSFUNCTION   empagliflozin 10 MG Tabs tablet Commonly known as:  JARDIANCE Take 10 mg by mouth daily with breakfast.   glucose blood test strip Commonly known  as:  FREESTYLE LITE Use to check blood sugar 3 times per day dx code E11.65   Insulin Pen Needle 32G X 6 MM Misc Commonly known as:  NOVOFINE Use 1 per day to inject Victoza   lisinopril 10 MG tablet Commonly known as:  PRINIVIL,ZESTRIL TAKE 1 TABLET (10 MG TOTAL) BY MOUTH DAILY.   metFORMIN 500 MG 24 hr tablet Commonly known as:  GLUCOPHAGE-XR TAKE 4 TABLETS ONCE A DAY ORALLY *NEEDS OFFICE VISIT*       Allergies: No Known Allergies  No past medical history on file.  No past surgical history on file.  Family History  Problem Relation Age of Onset  . Diabetes Mother   . Diabetes Father     Social History:  reports that he has never smoked. He has never used smokeless tobacco. His alcohol and drug histories are not on file.    Review of Systems        Lipids: treated with Lipitor 20 mg  previously but this was stopped empirically because of leg stiffness; however his LDL is still below 100 without medications      Lab Results  Component Value Date   CHOL 157 10/05/2015   HDL 52.20 10/05/2015   LDLCALC 90 10/05/2015   TRIG 72.0 10/05/2015   CHOLHDL 3 10/05/2015       The blood pressure has been controlled with 10 mg lisinopril No lightheadedness on standing   Complications: Erectile dysfunction. Treated with Cialis 20mg     Diabetic foot exam in 01/2016 showed normal monofilament sensation, Decreased pulses in feet, calluses Rt 1st toe  Physical Examination:  BP 110/78 (Cuff Size: Large)   Pulse 74   Temp 98.8 F (37.1 C) (Oral)   Resp 16   Ht 6\' 1"  (1.854 m)   Wt 280 lb 8 oz (127.2 kg)   SpO2 98%   BMI 37.01 kg/m        ASSESSMENT:  Diabetes type 2, uncontrolled with BMI  37    He is again having fairly normal blood sugars at home and normal A1c of 5% Highest glucose at home is 110 Although he has gained back some weight this is likely to be from transient change in activity level and diet He still appears to be quite motivated to keep his weight down and controlled diabetes Most likely does not need to be on a 2 drug regimen  HYPERTENSION:  His blood pressure is  well-controlled with low-dose lisinopril and also using Jardiance   PLAN:    Try stopping metformin, he will call if blood sugars are higher  He will continue on Jardiance which will help his blood pressure as well as maintain a lower weight and hopefully some cardiovascular protection in the long run  Encouraged him to continue his the diet consistently and restart exercise regimen as before  Follow-up in 4 months  He will check blood sugars by rotation at various times of the day and less in the morning   Patient Instructions  Check blood sugars on waking up  3x weekly  Also check blood sugars about 2 hours after a meal and do this after different meals by  rotation  Recommended blood sugar levels on waking up is 90-130 and about 2 hours after meal is 130-160  Please bring your blood sugar monitor to each visit, thank you    [facility] 06/13/2016, 9:15 PM   Note: This office note was prepared with Dragon voice recognition system technology. Any transcriptional  errors that result from this process are unintentional.

## 2016-06-13 NOTE — Patient Instructions (Signed)
Check blood sugars on waking up  3x weekly  Also check blood sugars about 2 hours after a meal and do this after different meals by rotation  Recommended blood sugar levels on waking up is 90-130 and about 2 hours after meal is 130-160  Please bring your blood sugar monitor to each visit, thank you  

## 2016-07-03 ENCOUNTER — Other Ambulatory Visit: Payer: Self-pay | Admitting: Endocrinology

## 2016-07-23 ENCOUNTER — Other Ambulatory Visit: Payer: Self-pay | Admitting: Endocrinology

## 2016-07-23 DIAGNOSIS — E1165 Type 2 diabetes mellitus with hyperglycemia: Secondary | ICD-10-CM

## 2016-07-23 DIAGNOSIS — IMO0002 Reserved for concepts with insufficient information to code with codable children: Secondary | ICD-10-CM

## 2016-07-28 ENCOUNTER — Encounter: Payer: Self-pay | Admitting: Gastroenterology

## 2016-07-31 ENCOUNTER — Encounter: Payer: Self-pay | Admitting: *Deleted

## 2016-08-04 ENCOUNTER — Ambulatory Visit: Payer: Commercial Managed Care - HMO | Admitting: Gastroenterology

## 2016-09-08 ENCOUNTER — Other Ambulatory Visit: Payer: Commercial Managed Care - HMO

## 2016-09-11 ENCOUNTER — Ambulatory Visit: Payer: Commercial Managed Care - HMO | Admitting: Gastroenterology

## 2016-09-30 ENCOUNTER — Other Ambulatory Visit: Payer: Self-pay | Admitting: Endocrinology

## 2016-10-06 ENCOUNTER — Other Ambulatory Visit (INDEPENDENT_AMBULATORY_CARE_PROVIDER_SITE_OTHER): Payer: Commercial Managed Care - HMO

## 2016-10-06 DIAGNOSIS — E119 Type 2 diabetes mellitus without complications: Secondary | ICD-10-CM

## 2016-10-06 LAB — COMPREHENSIVE METABOLIC PANEL
ALT: 12 U/L (ref 0–53)
AST: 22 U/L (ref 0–37)
Albumin: 4.3 g/dL (ref 3.5–5.2)
Alkaline Phosphatase: 59 U/L (ref 39–117)
BUN: 27 mg/dL — AB (ref 6–23)
CALCIUM: 9.7 mg/dL (ref 8.4–10.5)
CHLORIDE: 104 meq/L (ref 96–112)
CO2: 27 meq/L (ref 19–32)
Creatinine, Ser: 0.94 mg/dL (ref 0.40–1.50)
GFR: 109.88 mL/min (ref >60.00)
GLUCOSE: 110 mg/dL — AB (ref 70–99)
Potassium: 4.2 mEq/L (ref 3.5–5.1)
SODIUM: 135 meq/L (ref 135–145)
Total Bilirubin: 0.9 mg/dL (ref 0.2–1.2)
Total Protein: 7.3 g/dL (ref 6.0–8.3)

## 2016-10-06 LAB — LIPID PANEL
CHOL/HDL RATIO: 3
Cholesterol: 208 mg/dL — ABNORMAL HIGH (ref 0–200)
HDL: 61.1 mg/dL (ref >39.00)
LDL CALC: 114 mg/dL — AB (ref 0–99)
NONHDL: 147.36
TRIGLYCERIDES: 168 mg/dL — AB (ref 0.0–149.0)
VLDL: 33.6 mg/dL (ref 0.0–40.0)

## 2016-10-06 LAB — HEMOGLOBIN A1C: HEMOGLOBIN A1C: 5.1 % (ref 4.6–6.5)

## 2016-10-08 ENCOUNTER — Other Ambulatory Visit: Payer: Self-pay | Admitting: Endocrinology

## 2016-10-11 ENCOUNTER — Encounter: Payer: Self-pay | Admitting: Endocrinology

## 2016-10-11 ENCOUNTER — Ambulatory Visit (INDEPENDENT_AMBULATORY_CARE_PROVIDER_SITE_OTHER): Payer: Commercial Managed Care - HMO | Admitting: Endocrinology

## 2016-10-11 VITALS — BP 108/80 | HR 74 | Temp 98.2°F | Ht 73.0 in | Wt 293.6 lb

## 2016-10-11 DIAGNOSIS — E119 Type 2 diabetes mellitus without complications: Secondary | ICD-10-CM

## 2016-10-11 DIAGNOSIS — E782 Mixed hyperlipidemia: Secondary | ICD-10-CM | POA: Diagnosis not present

## 2016-10-11 NOTE — Progress Notes (Signed)
Patient ID: Gregory Fitzgerald, male   DOB: [birth year], 51 y.o.   MRN: [id number]     Reason for Appointment: Follow up forType 2 Diabetes  History of Present Illness:          Diagnosis: Type 2 diabetes mellitus, date of diagnosis: 2006        Past history: At diagnosis he was not having any symptoms and glucose was discovered incidentally when he was found to be hypertensive He has been on metformin for several years and at some point was also taking Amaryl His blood sugars had been significantly high since about 2013 with A1c around 10% On his initial consultation he was taking 1.8 mg Victoza along with metformin ER 1500 mg daily Baseline A1c was 9.7% Because of his obesity he was started on Invokana 100 mg for better control; Amaryl 2 mg was added because of fasting hyperglycemia A1c had come down to 6.6 in October 2015  Recent history:   Non-insulin hypoglycemic drugs the patient is taking are: Invokana 300 mg daily  His blood sugars have been excellent recently and A1c is stable in the normal range of 4.9-5.1   Current blood sugar patterns and management:  He has checked blood sugar occasionally and mostly in the mornings and before supper  He has continued to gain weight especially since last visit  He thinks this is mostly from his not exercising much, previously going to the gym 4 fairly vigorous exercise 4 days a week  Again he has been a little more irregular with his diet with not always restricting carbohydrates and fats as well as snacks as before  His metformin was stopped in the last visit since his blood sugars are excellent  Now he is back on [facility], previously on Jardiance because of insurance preference  Side effects from medications have been: None  GLUCOSE readings from download: FASTING range 101-129 and evening 89-99 AVERAGE 104  Self-care: The diet that the patient has been following is usually small portions and carbohydrate control     Meals: 3 meals per day .  His meals are usually  meat and vegetables. He will have snacks with tuna.  Less beef, more chicken and seafood         Exercise: Gym 0/7 days with exercise program on elliptical, walking  Dietician visit: Most recent: 2008 .   Wt Readings from Last 3 Encounters:  10/11/16 293 lb 9.6 oz (133.2 kg)  06/13/16 280 lb 8 oz (127.2 kg)  02/11/16 275 lb (124.7 kg)      Lab Results  Component Value Date   HGBA1C 5.1 10/06/2016   HGBA1C 5.0 06/08/2016   HGBA1C 4.9 02/11/2016   Lab Results  Component Value Date   MICROALBUR 10.9 (H) 02/11/2016   LDLCALC 114 (H) 10/06/2016   CREATININE 0.94 10/06/2016       OTHER active problems are discussed in review of systems               Allergies as of 10/11/2016   No Known Allergies     Medication List       Accurate as of 10/11/16  4:39 PM. Always use your most recent med list.          aspirin 81 MG tablet Take 81 mg by mouth daily.   CIALIS 20 MG tablet Generic drug:  tadalafil TAKE 1 TABLET EVERY DAY AS NEEDED FOR ERECTILE DYSFUNCTION   FREESTYLE LITE test strip Generic drug:  glucose blood USE TO CHECK BLOOD SUGAR 3 TIMES PER DAY DX CODE E11.65   Insulin Pen Needle 32G X 6 MM Misc Commonly known as:  NOVOFINE Use 1 per day to inject Victoza   INVOKANA 300 MG Tabs tablet Generic drug:  canagliflozin   lisinopril 10 MG tablet Commonly known as:  PRINIVIL,ZESTRIL TAKE 1 TABLET (10 MG TOTAL) BY MOUTH DAILY.       Allergies: No Known Allergies  Past Medical History:  Diagnosis Date  . Diabetes ([place])     No past surgical history on file.  Family History  Problem Relation Age of Onset  . Diabetes Mother   . Diabetes Father     Social History:  reports that he has never smoked. He has never used smokeless tobacco. His alcohol and drug histories are not on file.    Review of Systems        Lipids: treated with Lipitor 20 mg previously but this was stopped empirically because of  leg stiffness;  Although his LDL was down to 98 is now higher, also has gained weight, may not have been consistent with diet since last visit      Lab Results  Component Value Date   CHOL 208 (H) 10/06/2016   HDL 61.10 10/06/2016   LDLCALC 114 (H) 10/06/2016   TRIG 168.0 (H) 10/06/2016   CHOLHDL 3 10/06/2016       The blood pressure has been controlled with 10 mg lisinopril No lightheadedness on standing Has not taken Invokana this morning   Complications: Erectile dysfunction. Treated with Cialis 20mg     Diabetic foot exam in 01/2016 showed normal monofilament sensation, Decreased pulses in feet, calluses Rt 1st toe   Physical Examination:  BP 108/80 (Cuff Size: Large)   Pulse 74   Temp 98.2 F (36.8 C) (Oral)   Ht 6\' 1"  (1.854 m)   Wt 293 lb 9.6 oz (133.2 kg)   SpO2 98%   BMI 38.74 kg/m    Feet are normal to inspection, no edema Has some calluses on the right plantar surface    ASSESSMENT:  Diabetes type 2, uncontrolled with BMI  37    He is again having fairly normal blood sugars at home and normal A1c of 5% However he continues to gain weight and this is now related to lack of exercise mostly Can also be better with his portions and carbohydrates He says he will resume his exercise and be regular  HYPERTENSION:  His blood pressure is  well-controlled with low-dose lisinopril and also using Invokana  LIPIDS: He would try improving his diet and if LDL is still high consider pravastatin  PLAN:   Co-pay card for Invokana given  More readings after meals  Regular exercise  Low saturated fat diet and repeat lipids on the next visit  Follow-up in 4 months    There are no Patient Instructions on file for this visit.  [facility] 10/11/2016, 4:39 PM   Note: This office note was prepared with Dragon voice recognition system technology. Any transcriptional errors that result from this process are unintentional.

## 2016-10-21 ENCOUNTER — Other Ambulatory Visit: Payer: Self-pay | Admitting: Endocrinology

## 2016-10-24 ENCOUNTER — Telehealth: Payer: Self-pay | Admitting: Endocrinology

## 2016-10-24 NOTE — Telephone Encounter (Signed)
This is unlikely to be related to his diabetes, we just saw him 2 weeks ago. He needs to see his PCP

## 2016-10-24 NOTE — Telephone Encounter (Signed)
Spoke with the patients wife and she is going to call PCP

## 2016-10-24 NOTE — Telephone Encounter (Signed)
Pt's wife called in requesting to be seen by Dr. Dwyane Dee asap, she said that he is complaining of his legs hurting badly and being very stiff, I wanted to see if this is something Dr. Dwyane Dee would want to see him for or if he should see PCP. Please advise, can call wife at [phone] or Pt.

## 2016-11-03 ENCOUNTER — Other Ambulatory Visit: Payer: Self-pay | Admitting: Endocrinology

## 2016-11-06 ENCOUNTER — Other Ambulatory Visit: Payer: Self-pay | Admitting: Endocrinology

## 2017-02-02 ENCOUNTER — Other Ambulatory Visit: Payer: Commercial Managed Care - HMO

## 2017-02-06 ENCOUNTER — Ambulatory Visit: Payer: Commercial Managed Care - HMO | Admitting: Endocrinology

## 2017-02-18 ENCOUNTER — Other Ambulatory Visit: Payer: Self-pay | Admitting: Endocrinology

## 2017-02-21 ENCOUNTER — Other Ambulatory Visit: Payer: Self-pay | Admitting: Endocrinology

## 2017-02-21 DIAGNOSIS — E1165 Type 2 diabetes mellitus with hyperglycemia: Secondary | ICD-10-CM

## 2017-02-21 DIAGNOSIS — IMO0002 Reserved for concepts with insufficient information to code with codable children: Secondary | ICD-10-CM

## 2017-02-23 ENCOUNTER — Other Ambulatory Visit: Payer: Self-pay | Admitting: Endocrinology

## 2017-06-04 ENCOUNTER — Other Ambulatory Visit (INDEPENDENT_AMBULATORY_CARE_PROVIDER_SITE_OTHER): Payer: Commercial Managed Care - HMO

## 2017-06-04 DIAGNOSIS — E119 Type 2 diabetes mellitus without complications: Secondary | ICD-10-CM | POA: Diagnosis not present

## 2017-06-04 DIAGNOSIS — E782 Mixed hyperlipidemia: Secondary | ICD-10-CM

## 2017-06-04 LAB — LIPID PANEL
CHOL/HDL RATIO: 4
Cholesterol: 211 mg/dL — ABNORMAL HIGH (ref 0–200)
HDL: 53.8 mg/dL (ref >39.00)
LDL CALC: 133 mg/dL — AB (ref 0–99)
NONHDL: 157.24
TRIGLYCERIDES: 119 mg/dL (ref 0.0–149.0)
VLDL: 23.8 mg/dL (ref 0.0–40.0)

## 2017-06-04 LAB — MICROALBUMIN / CREATININE URINE RATIO
CREATININE, U: 60.6 mg/dL
MICROALB UR: 33.7 mg/dL — AB (ref 0.0–1.9)
MICROALB/CREAT RATIO: 55.6 mg/g — AB (ref 0.0–30.0)

## 2017-06-04 LAB — COMPREHENSIVE METABOLIC PANEL
ALT: 13 U/L (ref 0–53)
AST: 18 U/L (ref 0–37)
Albumin: 4.1 g/dL (ref 3.5–5.2)
Alkaline Phosphatase: 59 U/L (ref 39–117)
BUN: 18 mg/dL (ref 6–23)
CALCIUM: 9.7 mg/dL (ref 8.4–10.5)
CHLORIDE: 104 meq/L (ref 96–112)
CO2: 26 meq/L (ref 19–32)
Creatinine, Ser: 1.07 mg/dL (ref 0.40–1.50)
GFR: 94.36 mL/min (ref >60.00)
GLUCOSE: 262 mg/dL — AB (ref 70–99)
POTASSIUM: 4.2 meq/L (ref 3.5–5.1)
Sodium: 138 mEq/L (ref 135–145)
Total Bilirubin: 0.8 mg/dL (ref 0.2–1.2)
Total Protein: 7.4 g/dL (ref 6.0–8.3)

## 2017-06-04 LAB — HEMOGLOBIN A1C: Hgb A1c MFr Bld: 6.6 % — ABNORMAL HIGH (ref 4.6–6.5)

## 2017-06-07 ENCOUNTER — Encounter: Payer: Self-pay | Admitting: Endocrinology

## 2017-06-07 ENCOUNTER — Ambulatory Visit (INDEPENDENT_AMBULATORY_CARE_PROVIDER_SITE_OTHER): Payer: 59 | Admitting: Endocrinology

## 2017-06-07 VITALS — BP 134/80 | HR 69 | Ht 73.0 in | Wt 331.6 lb

## 2017-06-07 DIAGNOSIS — I10 Essential (primary) hypertension: Secondary | ICD-10-CM | POA: Diagnosis not present

## 2017-06-07 DIAGNOSIS — E782 Mixed hyperlipidemia: Secondary | ICD-10-CM | POA: Diagnosis not present

## 2017-06-07 DIAGNOSIS — Z23 Encounter for immunization: Secondary | ICD-10-CM

## 2017-06-07 DIAGNOSIS — E1165 Type 2 diabetes mellitus with hyperglycemia: Secondary | ICD-10-CM | POA: Diagnosis not present

## 2017-06-07 MED ORDER — CANAGLIFLOZIN-METFORMIN HCL ER 150-1000 MG PO TB24: 2.0000 | ORAL_TABLET | Freq: Every day | ORAL | 2 refills | Status: DC

## 2017-06-07 MED ORDER — VICTOZA 18 MG/3ML ~~LOC~~ SOPN: 1.8000 mg | PEN_INJECTOR | Freq: Every day | SUBCUTANEOUS | 3 refills | Status: DC

## 2017-06-07 NOTE — Progress Notes (Signed)
Patient ID: Gregory Fitzgerald, male   DOB: [birth year], 51 y.o.   MRN: [id number]     Reason for Appointment: Follow up forType 2 Diabetes  History of Present Illness:          Diagnosis: Type 2 diabetes mellitus, date of diagnosis: 2006        Past history: At diagnosis he was not having any symptoms and glucose was discovered incidentally when he was found to be hypertensive He has been on metformin for several years and at some point was also taking Amaryl His blood sugars had been significantly high since about 2013 with A1c around 10% On his initial consultation he was taking 1.8 mg Victoza along with metformin ER 1500 mg daily Baseline A1c was 9.7% Because of his obesity he was started on Invokana 100 mg for better control; Amaryl 2 mg was added because of fasting hyperglycemia A1c had come down to 6.6 in October 2015  Recent history:   Non-insulin hypoglycemic drugs the patient is taking are: Invokana 300 mg daily  His blood sugars have been excellent previously with weight loss and A1c 5.1 better However A1c is now jumped up to 6.6   Current blood sugar patterns and management:  He has checked blood sugar very irregularly and more recently they have been around 200  He says that he is not doing well because of his family commitments and stress and has gained significant amount of weight since his last visit; his diet has been poor especially when eating out and traveling  Has not found the time for exercise also, previously going to the gym regularly  Blood sugars are not controlled even though he is continuing the [place] regularly  Does not have symptoms of hyperglycemia  Side effects from medications have been: None  GLUCOSE: Around 200, does not have meter today   Self-care: Variable diet recently     Meals: 3 meals per day .  His meals are usually  meat and vegetables. He will have low carbohydrate snacks          Exercise: Gym 0/7 days with  exercise program on elliptical, walking  Dietician visit: Most recent: 2008 .   Wt Readings from Last 3 Encounters:  06/07/17 (!) 331 lb 9.6 oz (150.4 kg)  10/11/16 293 lb 9.6 oz (133.2 kg)  06/13/16 280 lb 8 oz (127.2 kg)      Lab Results  Component Value Date   HGBA1C 6.6 (H) 06/04/2017   HGBA1C 5.1 10/06/2016   HGBA1C 5.0 06/08/2016   Lab Results  Component Value Date   MICROALBUR 33.7 (H) 06/04/2017   LDLCALC 133 (H) 06/04/2017   CREATININE 1.07 06/04/2017       OTHER active problems are discussed in review of systems               Allergies as of 06/07/2017   No Known Allergies     Medication List        Accurate as of 06/07/17  9:18 AM. Always use your most recent med list.          aspirin 81 MG tablet Take 81 mg by mouth daily.   CIALIS 20 MG tablet Generic drug:  tadalafil TAKE 1 TABLET EVERY DAY AS NEEDED FOR ERECTILE DYSFUNCTION   FREESTYLE LITE test strip Generic drug:  glucose blood USE TO CHECK BLOOD SUGAR 3 TIMES PER DAY DX CODE E11.65   Insulin Pen Needle 32G X 6 MM Misc  Commonly known as:  NOVOFINE Use 1 per day to inject Victoza   INVOKANA 300 MG Tabs tablet Generic drug:  canagliflozin TAKE 1 TABLET BY MOUTH EVERY DAY BEFORE BREAKFAST   lisinopril 10 MG tablet Commonly known as:  PRINIVIL,ZESTRIL TAKE 1 TABLET EVERY DAY       Allergies: No Known Allergies  Past Medical History:  Diagnosis Date  . Diabetes ([place])     No past surgical history on file.  Family History  Problem Relation Age of Onset  . Diabetes Mother   . Diabetes Father     Social History:  reports that  has never smoked. he has never used smokeless tobacco. His alcohol and drug histories are not on file.    Review of Systems        Lipids: treated with Lipitor 20 mg previously but this was stopped empirically because of leg stiffness;  Although his LDL was down to as low as 98 is now getting higher, also has gained weight He has been eating out  frequently and not watching his saturated fats      Lab Results  Component Value Date   CHOL 211 (H) 06/04/2017   HDL 53.80 06/04/2017   LDLCALC 133 (H) 06/04/2017   TRIG 119.0 06/04/2017   CHOLHDL 4 06/04/2017       The blood pressure has been controlled with 10 mg lisinopril Has not taken Invokana 2 days   Complications: Erectile dysfunction. Treated with Cialis 20mg  as needed    Diabetic foot exam in 11/18 showed normal monofilament sensation, Decreased pulses in feet, calluses Rt distal foot   Physical Examination:  BP 134/80   Pulse 69   Ht 6\' 1"  (1.854 m)   Wt (!) 331 lb 9.6 oz (150.4 kg)   SpO2 97%   BMI 43.75 kg/m      no pedal edema  Diabetic Foot Exam - Simple   Simple Foot Form Diabetic Foot exam was performed with the following findings:  Yes   Visual Inspection No deformities, no ulcerations, no other skin breakdown bilaterally:  Yes See comments:  Yes Sensation Testing Intact to touch and monofilament testing bilaterally:  Yes Pulse Check Posterior Tibialis and Dorsalis pulse intact bilaterally:  Yes Comments Has significant intact callus on the distal plantar surface of the right foot and mild on the left distal foot medially     ASSESSMENT:  Diabetes type 2, uncontrolled with BMI  37    See history of present illness for detailed discussion of current diabetes management, blood sugar patterns and problems identified  He is now poorly controlled with gaining a significant amount of weight over the last few months from poor diet and lack of exercise He had previously been very motivated to restrict calories, carbohydrates and fat intake with normalization of his blood sugars Blood sugars are over 200 although A1c is lower than expected at 6.6, this is with Invokana alone Currently his renal function is still normal and no contraindication to Invokana or metformin  He will benefit again from taking a GLP-1 drug, has taken Victoza without side  effects previously Also needs consistent and vigorous exercise regularly Discussed need for better blood sugar monitoring and need to bring monitor on each visit for download  HYPERTENSION:  His blood pressure is  well-controlled with low-dose lisinopril and also  Invokana, will continue same regimen for now  LIPIDS: LDL is significantly higher than before He does not have risk factors but would consider starting  treatment if LDL persistently high despite improving diet and weight loss  PLAN:   Co-pay card for Invokana and metformin combination given, he can go to the Invokamet 150/1000, 2 tablets daily for convenience  Restart Victoza using 0.6 mg for 2-3 days, then 1.2 mg and if no nausea go up to 1.8 mg within a week or so  Start checking blood sugars regularly at least once a day at various times and bring monitor for download on each visit  Restart regular exercise at the gym as before  To call if his blood sugars are not improving  Need follow-up in another 2 months to make sure he is having better control of his blood sugars  Reduce saturated fat in diet consistently  He needs to check his feet daily, consider foot specialist consultation if calluses symptomatic   There are no Patient Instructions on file for this visit.  Gregory Fitzgerald 06/07/2017, 9:18 AM   Note: This office note was prepared with Dragon voice recognition system technology. Any transcriptional errors that result from this process are unintentional.   Counseling time on subjects discussed in assessment and plan sections is over 50% of today's 25 minute visit

## 2017-06-12 ENCOUNTER — Telehealth: Payer: Self-pay | Admitting: *Deleted

## 2017-06-12 ENCOUNTER — Other Ambulatory Visit: Payer: Self-pay

## 2017-06-12 DIAGNOSIS — E1165 Type 2 diabetes mellitus with hyperglycemia: Secondary | ICD-10-CM

## 2017-06-12 DIAGNOSIS — IMO0002 Reserved for concepts with insufficient information to code with codable children: Secondary | ICD-10-CM

## 2017-06-12 MED ORDER — INSULIN PEN NEEDLE 32G X 6 MM MISC: 5 refills | Status: DC

## 2017-06-12 NOTE — Telephone Encounter (Signed)
I have sent these over electronically to the [facility] on [address].

## 2017-06-12 NOTE — Telephone Encounter (Signed)
Gregory Fitzgerald calling from [facility] states the patient needs a refill of pen needles for the Victoza. They have sent over refill request. Please refill. Thank you.

## 2017-06-18 ENCOUNTER — Other Ambulatory Visit: Payer: Self-pay

## 2017-06-18 DIAGNOSIS — E1165 Type 2 diabetes mellitus with hyperglycemia: Secondary | ICD-10-CM

## 2017-06-18 DIAGNOSIS — IMO0002 Reserved for concepts with insufficient information to code with codable children: Secondary | ICD-10-CM

## 2017-06-18 MED ORDER — INSULIN PEN NEEDLE 32G X 6 MM MISC: 5 refills | Status: DC

## 2017-07-08 ENCOUNTER — Other Ambulatory Visit: Payer: Self-pay | Admitting: Endocrinology

## 2017-07-16 ENCOUNTER — Other Ambulatory Visit: Payer: Self-pay

## 2017-07-16 ENCOUNTER — Telehealth: Payer: Self-pay

## 2017-07-16 NOTE — Telephone Encounter (Signed)
Patient requesting a refill on Cialis 20 mg can you fill this in Dr. Jodelle Green absence- please advise

## 2017-07-16 NOTE — Telephone Encounter (Signed)
Would someone be able to fill this prescription for this patient?  Please see message.

## 2017-07-16 NOTE — Telephone Encounter (Signed)
Ok to refill 

## 2017-07-20 ENCOUNTER — Other Ambulatory Visit: Payer: Self-pay

## 2017-07-20 MED ORDER — TADALAFIL 20 MG PO TABS: ORAL_TABLET | ORAL | 2 refills | Status: DC

## 2017-07-20 NOTE — Telephone Encounter (Signed)
I have sent in.  

## 2017-07-20 NOTE — Telephone Encounter (Signed)
Dr. Dwyane Dee, are you able to send this prescription in today?

## 2017-07-20 NOTE — Telephone Encounter (Signed)
This is not a controlled medication and you can send it

## 2017-07-30 ENCOUNTER — Other Ambulatory Visit: Payer: 59

## 2017-08-02 ENCOUNTER — Ambulatory Visit: Payer: 59 | Admitting: Endocrinology

## 2017-09-09 ENCOUNTER — Other Ambulatory Visit: Payer: Self-pay | Admitting: Endocrinology

## 2017-09-16 DIAGNOSIS — J069 Acute upper respiratory infection, unspecified: Secondary | ICD-10-CM | POA: Diagnosis not present

## 2017-09-29 DIAGNOSIS — M25561 Pain in right knee: Secondary | ICD-10-CM | POA: Diagnosis not present

## 2017-09-29 DIAGNOSIS — M25562 Pain in left knee: Secondary | ICD-10-CM | POA: Diagnosis not present

## 2017-11-09 ENCOUNTER — Other Ambulatory Visit: Payer: Self-pay | Admitting: Endocrinology

## 2017-11-12 ENCOUNTER — Other Ambulatory Visit: Payer: Self-pay | Admitting: Endocrinology

## 2017-11-12 ENCOUNTER — Telehealth: Payer: Self-pay | Admitting: Endocrinology

## 2017-11-13 NOTE — Telephone Encounter (Signed)
error 

## 2017-12-12 ENCOUNTER — Other Ambulatory Visit: Payer: Self-pay | Admitting: Endocrinology

## 2017-12-12 NOTE — Telephone Encounter (Signed)
Need to refuse with note to make appointment

## 2017-12-12 NOTE — Telephone Encounter (Signed)
Last OV 06/07/17 2 cancelled and no future scheduled refill or refuse please advise

## 2017-12-12 NOTE — Telephone Encounter (Signed)
This has been done.

## 2017-12-16 ENCOUNTER — Other Ambulatory Visit: Payer: Self-pay | Admitting: Endocrinology

## 2017-12-19 ENCOUNTER — Other Ambulatory Visit (INDEPENDENT_AMBULATORY_CARE_PROVIDER_SITE_OTHER): Payer: 59

## 2017-12-19 DIAGNOSIS — E1165 Type 2 diabetes mellitus with hyperglycemia: Secondary | ICD-10-CM

## 2017-12-19 LAB — COMPREHENSIVE METABOLIC PANEL
ALBUMIN: 4.1 g/dL (ref 3.5–5.2)
ALK PHOS: 54 U/L (ref 39–117)
ALT: 13 U/L (ref 0–53)
AST: 19 U/L (ref 0–37)
BILIRUBIN TOTAL: 0.6 mg/dL (ref 0.2–1.2)
BUN: 23 mg/dL (ref 6–23)
CO2: 25 mEq/L (ref 19–32)
Calcium: 9.6 mg/dL (ref 8.4–10.5)
Chloride: 109 mEq/L (ref 96–112)
Creatinine, Ser: 0.94 mg/dL (ref 0.40–1.50)
GFR: 109.33 mL/min (ref >60.00)
Glucose, Bld: 128 mg/dL — ABNORMAL HIGH (ref 70–99)
POTASSIUM: 4.3 meq/L (ref 3.5–5.1)
Sodium: 141 mEq/L (ref 135–145)
TOTAL PROTEIN: 7.5 g/dL (ref 6.0–8.3)

## 2017-12-19 LAB — LIPID PANEL
CHOLESTEROL: 192 mg/dL (ref 0–200)
HDL: 54.9 mg/dL (ref >39.00)
LDL Cholesterol: 117 mg/dL — ABNORMAL HIGH (ref 0–99)
NonHDL: 136.88
Total CHOL/HDL Ratio: 3
Triglycerides: 97 mg/dL (ref 0.0–149.0)
VLDL: 19.4 mg/dL (ref 0.0–40.0)

## 2017-12-20 LAB — FRUCTOSAMINE: FRUCTOSAMINE: 239 umol/L (ref 0–285)

## 2017-12-21 ENCOUNTER — Ambulatory Visit: Payer: 59 | Admitting: Endocrinology

## 2017-12-21 ENCOUNTER — Encounter: Payer: Self-pay | Admitting: Endocrinology

## 2017-12-21 VITALS — BP 128/82 | HR 82 | Ht 73.0 in | Wt 319.8 lb

## 2017-12-21 DIAGNOSIS — E7800 Pure hypercholesterolemia, unspecified: Secondary | ICD-10-CM | POA: Diagnosis not present

## 2017-12-21 DIAGNOSIS — E1165 Type 2 diabetes mellitus with hyperglycemia: Secondary | ICD-10-CM | POA: Diagnosis not present

## 2017-12-21 LAB — POCT GLYCOSYLATED HEMOGLOBIN (HGB A1C): Hemoglobin A1C: 5.2 % (ref 4.0–5.6)

## 2017-12-21 MED ORDER — PRAVASTATIN SODIUM 20 MG PO TABS: 20.0000 mg | ORAL_TABLET | Freq: Every day | ORAL | 3 refills | Status: DC

## 2017-12-21 NOTE — Progress Notes (Signed)
Patient ID: Gregory Fitzgerald, male   DOB: October 24, 1965, 52 y.o.   MRN: [id number]     Reason for Appointment: Follow up forType 2 Diabetes  History of Present Illness:          Diagnosis: Type 2 diabetes mellitus, date of diagnosis: 2006        Past history: At diagnosis he was not having any symptoms and glucose was discovered incidentally when he was found to be hypertensive He has been on metformin for several years and at some point was also taking Amaryl His blood sugars had been significantly high since about 2013 with A1c around 10% On his initial consultation he was taking 1.8 mg Victoza along with metformin ER 1500 mg daily Baseline A1c was 9.7% Because of his obesity he was started on Invokana 100 mg for better control; Amaryl 2 mg was added because of fasting hyperglycemia A1c had come down to 6.6 in October 2015  Recent history:   Non-insulin hypoglycemic drugs the patient is taking are: Invokamet XR 150/1000 mg daily, Victoza 1.8 mg daily  He has not been seen in follow-up since 05/2017  His blood sugars have been excellent previously with weight loss and lowest A1c was 5.1 but in 11/18 was 6.6 This is now back to 5.2 Fructosamine is also fairly good at 239   Current blood sugar patterns and management:  He was started back on Victoza in 11/18 when his blood sugar started going up and his weight had increased 38 pounds  Has checked blood sugar very irregularly and he stopped doing this because his sugars came down and has not done any for a couple more  Also because of family issues and stress he has not watch his eyes consistently or exercise  He has lost weight since last visit but he thinks it is going up again  Recent blood sugar in the office appears normal  However a couple of weeks ago he had a Medrol Dosepak and his blood sugar recently was 120 fasting and on lab work was 128  He does not know to check blood sugars more consistently when he is on  steroids  Side effects from medications have been: None  GLUCOSE: 122   Self-care: Variable diet currently     Meals: 3 meals per day .  His meals are usually  meat and vegetables. He will have low carbohydrate snacks          Exercise: Previously going to the gym for exercise program on elliptical, walking  Dietician visit: Most recent: 2008 .   Wt Readings from Last 3 Encounters:  12/21/17 (!) 319 lb 12.8 oz (145.1 kg)  06/07/17 (!) 331 lb 9.6 oz (150.4 kg)  10/11/16 293 lb 9.6 oz (133.2 kg)      Lab Results  Component Value Date   HGBA1C 5.2 12/21/2017   HGBA1C 6.6 (H) 06/04/2017   HGBA1C 5.1 10/06/2016   Lab Results  Component Value Date   MICROALBUR 33.7 (H) 06/04/2017   LDLCALC 117 (H) 12/19/2017   CREATININE 0.94 12/19/2017       OTHER active problems are discussed in review of systems               Allergies as of 12/21/2017   No Known Allergies     Medication List        Accurate as of 12/21/17 11:59 PM. Always use your most recent med list.  aspirin 81 MG tablet Take 81 mg by mouth daily.   FREESTYLE LITE test strip Generic drug:  glucose blood USE TO CHECK BLOOD SUGAR 3 TIMES PER DAY DX CODE E11.65   Insulin Pen Needle 32G X 6 MM Misc Commonly known as:  NOVOFINE Use 1 per day to inject Victoza   INVOKAMET XR [phone] MG Tb24 Generic drug:  Canagliflozin-metFORMIN HCl ER TAKE 2 TABLETS DAILY WITH BREAKFAST BY MOUTH.   lisinopril 10 MG tablet Commonly known as:  PRINIVIL,ZESTRIL TAKE 1 TABLET BY MOUTH EVERY DAY   pravastatin 20 MG tablet Commonly known as:  PRAVACHOL Take 1 tablet (20 mg total) by mouth daily.   tadalafil 20 MG tablet Commonly known as:  CIALIS TAKE 1 TABLET EVERY DAY AS NEEDED FOR ERECTILE DYSFUNCTION   VICTOZA 18 MG/3ML Sopn Generic drug:  liraglutide INJECT 0.3 MLS (1.8 MG TOTAL) DAILY INTO THE SKIN. INJECT ONCE DAILY AT THE SAME TIME       Allergies: No Known Allergies  Past Medical History:    Diagnosis Date  . Diabetes ([place])     History reviewed. No pertinent surgical history.  Family History  Problem Relation Age of Onset  . Diabetes Mother   . Diabetes Father     Social History:  reports that he has never smoked. He has never used smokeless tobacco. His alcohol and drug histories are not on file.    Review of Systems        Lipids: treated with Lipitor 20 mg previously but this was stopped empirically because of leg stiffness;  Although his LDL was down to as low as 98 is now getting higher consistently  He has been eating out frequently and not watching his saturated fats      Lab Results  Component Value Date   CHOL 192 12/19/2017   HDL 54.90 12/19/2017   LDLCALC 117 (H) 12/19/2017   TRIG 97.0 12/19/2017   CHOLHDL 3 12/19/2017       The blood pressure has been controlled with 10 mg lisinopril Not monitoring at home   Complications: Erectile dysfunction. Treated with Cialis 20mg  as needed    Diabetic foot exam in 11/18 showed normal monofilament sensation, Decreased pulses in feet, calluses Rt distal foot   Physical Examination:  BP 128/82 (BP Location: Left Arm, Patient Position: Sitting, Cuff Size: Large)   Pulse 82   Ht 6\' 1"  (1.854 m)   Wt (!) 319 lb 12.8 oz (145.1 kg)   SpO2 98%   BMI 42.19 kg/m   No pedal edema   ASSESSMENT:  Diabetes type 2, uncontrolled with BMI  42  See history of present illness for detailed discussion of current diabetes management, blood sugar patterns and problems identified  He is now on Victoza and Invokana Recent A1c back down to 5.2  With Victoza he has had better blood sugars compared to his last visit although has not been seen for several months He is not checking his sugars regularly including postprandial  HYPERTENSION:  His blood pressure is  well-controlled with low-dose lisinopril and also on Invokana, will continue   LIPIDS: LDL is consistently higher than before when he had lost a lot of  weight He thinks he had been intolerant to Lipitor previously   PLAN:   Needs to be paying attention to meal planning and get on a consistent diet as before  Regular exercise  No change of medications  Check blood sugars at least 3 times a week including  after meals  PRAVASTATIN for hypercholesterolemia and risk reduction  Follow-up in 4 months   Patient Instructions  Good diet  Check blood sugars on waking up  2/7  Also check blood sugars about 2 hours after a meal and do this after different meals by rotation  Recommended blood sugar levels on waking up is 90-130 and about 2 hours after meal is 130-160  Please bring your blood sugar monitor to each visit, thank you    Elayne Snare 12/22/2017, 4:09 PM   Note: This office note was prepared with Dragon voice recognition system technology. Any transcriptional errors that result from this process are unintentional.

## 2017-12-21 NOTE — Patient Instructions (Signed)
Good diet  Check blood sugars on waking up  2/7  Also check blood sugars about 2 hours after a meal and do this after different meals by rotation  Recommended blood sugar levels on waking up is 90-130 and about 2 hours after meal is 130-160  Please bring your blood sugar monitor to each visit, thank you

## 2017-12-23 ENCOUNTER — Other Ambulatory Visit: Payer: Self-pay | Admitting: Endocrinology

## 2018-01-15 DIAGNOSIS — M19072 Primary osteoarthritis, left ankle and foot: Secondary | ICD-10-CM | POA: Diagnosis not present

## 2018-01-15 DIAGNOSIS — M545 Low back pain: Secondary | ICD-10-CM | POA: Diagnosis not present

## 2018-01-15 DIAGNOSIS — M25572 Pain in left ankle and joints of left foot: Secondary | ICD-10-CM | POA: Diagnosis not present

## 2018-01-16 DIAGNOSIS — R809 Proteinuria, unspecified: Secondary | ICD-10-CM | POA: Diagnosis not present

## 2018-01-16 DIAGNOSIS — I10 Essential (primary) hypertension: Secondary | ICD-10-CM | POA: Diagnosis not present

## 2018-01-16 DIAGNOSIS — E1129 Type 2 diabetes mellitus with other diabetic kidney complication: Secondary | ICD-10-CM | POA: Diagnosis not present

## 2018-02-06 DIAGNOSIS — M5416 Radiculopathy, lumbar region: Secondary | ICD-10-CM | POA: Diagnosis not present

## 2018-02-06 DIAGNOSIS — M5136 Other intervertebral disc degeneration, lumbar region: Secondary | ICD-10-CM | POA: Diagnosis not present

## 2018-02-14 DIAGNOSIS — M5416 Radiculopathy, lumbar region: Secondary | ICD-10-CM | POA: Diagnosis not present

## 2018-02-24 ENCOUNTER — Other Ambulatory Visit: Payer: Self-pay | Admitting: Endocrinology

## 2018-02-25 DIAGNOSIS — M5416 Radiculopathy, lumbar region: Secondary | ICD-10-CM | POA: Diagnosis not present

## 2018-02-26 ENCOUNTER — Other Ambulatory Visit: Payer: Self-pay | Admitting: Chiropractic Medicine

## 2018-02-26 DIAGNOSIS — M5416 Radiculopathy, lumbar region: Secondary | ICD-10-CM

## 2018-03-04 ENCOUNTER — Ambulatory Visit
Admission: RE | Admit: 2018-03-04 | Discharge: 2018-03-04 | Disposition: A | Discharge status: Home / Self Care | Payer: 59 | Source: Ambulatory Visit | Attending: Chiropractic Medicine | Admitting: Chiropractic Medicine

## 2018-03-04 DIAGNOSIS — M5126 Other intervertebral disc displacement, lumbar region: Secondary | ICD-10-CM | POA: Diagnosis not present

## 2018-03-04 DIAGNOSIS — M5416 Radiculopathy, lumbar region: Secondary | ICD-10-CM

## 2018-03-04 MED ORDER — IOPAMIDOL (ISOVUE-M 200) INJECTION 41%
1.0000 mL | Freq: Once | INTRAMUSCULAR | Status: AC
  Administered 2018-03-04: 1 mL via EPIDURAL

## 2018-03-04 MED ORDER — METHYLPREDNISOLONE ACETATE 40 MG/ML INJ SUSP (RADIOLOG
120.0000 mg | Freq: Once | INTRAMUSCULAR | Status: AC
  Administered 2018-03-04: 120 mg via EPIDURAL

## 2018-03-04 NOTE — Discharge Instructions (Signed)

## 2018-03-20 ENCOUNTER — Other Ambulatory Visit: Payer: Self-pay | Admitting: Endocrinology

## 2018-04-16 DIAGNOSIS — H1010 Acute atopic conjunctivitis, unspecified eye: Secondary | ICD-10-CM | POA: Diagnosis not present

## 2018-04-21 ENCOUNTER — Other Ambulatory Visit: Payer: Self-pay | Admitting: Endocrinology

## 2018-04-23 ENCOUNTER — Encounter: Payer: Self-pay | Admitting: Endocrinology

## 2018-04-23 ENCOUNTER — Ambulatory Visit: Payer: 59 | Admitting: Endocrinology

## 2018-04-23 VITALS — BP 132/92 | HR 86 | Ht 72.0 in | Wt 301.0 lb

## 2018-04-23 DIAGNOSIS — E7800 Pure hypercholesterolemia, unspecified: Secondary | ICD-10-CM | POA: Diagnosis not present

## 2018-04-23 DIAGNOSIS — E1165 Type 2 diabetes mellitus with hyperglycemia: Secondary | ICD-10-CM | POA: Diagnosis not present

## 2018-04-23 DIAGNOSIS — Z23 Encounter for immunization: Secondary | ICD-10-CM | POA: Diagnosis not present

## 2018-04-23 LAB — COMPREHENSIVE METABOLIC PANEL
ALT: 13 U/L (ref 0–53)
AST: 18 U/L (ref 0–37)
Albumin: 4.3 g/dL (ref 3.5–5.2)
Alkaline Phosphatase: 47 U/L (ref 39–117)
BILIRUBIN TOTAL: 1 mg/dL (ref 0.2–1.2)
BUN: 16 mg/dL (ref 6–23)
CALCIUM: 9.6 mg/dL (ref 8.4–10.5)
CHLORIDE: 107 meq/L (ref 96–112)
CO2: 24 meq/L (ref 19–32)
Creatinine, Ser: 0.97 mg/dL (ref 0.40–1.50)
GFR: 105.29 mL/min (ref >60.00)
Glucose, Bld: 96 mg/dL (ref 70–99)
POTASSIUM: 4 meq/L (ref 3.5–5.1)
Sodium: 140 mEq/L (ref 135–145)
Total Protein: 7.4 g/dL (ref 6.0–8.3)

## 2018-04-23 LAB — POCT GLYCOSYLATED HEMOGLOBIN (HGB A1C): Hemoglobin A1C: 5.1 % (ref 4.0–5.6)

## 2018-04-23 LAB — LIPID PANEL
Cholesterol: 170 mg/dL (ref 0–200)
HDL: 54.4 mg/dL (ref >39.00)
LDL Cholesterol: 100 mg/dL — ABNORMAL HIGH (ref 0–99)
NONHDL: 115.82
Total CHOL/HDL Ratio: 3
Triglycerides: 77 mg/dL (ref 0.0–149.0)
VLDL: 15.4 mg/dL (ref 0.0–40.0)

## 2018-04-23 LAB — MICROALBUMIN / CREATININE URINE RATIO
CREATININE, U: 129.1 mg/dL
MICROALB UR: 124.8 mg/dL — AB (ref 0.0–1.9)
MICROALB/CREAT RATIO: 96.7 mg/g — AB (ref 0.0–30.0)

## 2018-04-23 LAB — HEMOGLOBIN A1C: Hgb A1c MFr Bld: 5.1 % (ref 4.6–6.5)

## 2018-04-23 MED ORDER — LISINOPRIL 20 MG PO TABS: 20.0000 mg | ORAL_TABLET | Freq: Every day | ORAL | 3 refills | Status: DC

## 2018-04-23 MED ORDER — PRAVASTATIN SODIUM 40 MG PO TABS: 40.0000 mg | ORAL_TABLET | Freq: Every day | ORAL | 3 refills | Status: DC

## 2018-04-23 NOTE — Progress Notes (Signed)
Patient ID: Gregory Fitzgerald, male   DOB: 12-Dec-1965, 52 y.o.   MRN: [id number]     Reason for Appointment: Follow up forType 2 Diabetes  History of Present Illness:          Diagnosis: Type 2 diabetes mellitus, date of diagnosis: 2006        Past history: At diagnosis he was not having any symptoms and glucose was discovered incidentally when he was found to be hypertensive He has been on metformin for several years and at some point was also taking Amaryl His blood sugars had been significantly high since about 2013 with A1c around 10% On his initial consultation he was taking 1.8 mg Victoza along with metformin ER 1500 mg daily Baseline A1c was 9.7% Because of his obesity he was started on Invokana 100 mg for better control; Amaryl 2 mg was added because of fasting hyperglycemia A1c had come down to 6.6 in October 2015  Recent history:   Non-insulin hypoglycemic drugs the patient is taking are: Invokamet XR 150/1000 mg daily, Victoza 1.8 mg daily  His blood sugars have been excellent previously with weight loss and lowest A1c was 5.1 but in 11/18 was 6.6  A1c is now consistently back to normal levels, now 5.1   Current blood sugar patterns and management:  He has checked his blood sugar only very sporadically  Couple of readings this past Sunday and also this morning in the mornings ranging from 108 up to 131  However he has continued to be watching his diet carefully and has continued to lose weight, has lost a total of 30 pounds since last November  He did have some difficulty with exercising consistently because of sciatica pain which is now better  He is trying to walk fairly regularly  No side effects from Invokana, metformin or Victoza   Side effects from medications have been: None  GLUCOSE MONITORING:  As above, using freestyle monitor  Self-care:  .  His meals are usually  meat and vegetables. He will have low carbohydrate snacks            Exercise: 2 miles walking recently  Dietician visit: Most recent: 2008 .   Wt Readings from Last 3 Encounters:  04/23/18 (!) 301 lb (136.5 kg)  12/21/17 (!) 319 lb 12.8 oz (145.1 kg)  06/07/17 (!) 331 lb 9.6 oz (150.4 kg)      Lab Results  Component Value Date   HGBA1C 5.1 04/23/2018   HGBA1C 5.2 12/21/2017   HGBA1C 6.6 (H) 06/04/2017   Lab Results  Component Value Date   MICROALBUR 33.7 (H) 06/04/2017   LDLCALC 117 (H) 12/19/2017   CREATININE 0.94 12/19/2017       OTHER active problems are discussed in review of systems               Allergies as of 04/23/2018   No Known Allergies     Medication List        Accurate as of 04/23/18  4:55 PM. Always use your most recent med list.          aspirin 81 MG tablet Take 81 mg by mouth daily.   FREESTYLE LITE test strip Generic drug:  glucose blood USE TO CHECK BLOOD SUGAR 3 TIMES PER DAY DX CODE E11.65   Insulin Pen Needle 32G X 6 MM Misc Use 1 per day to inject Victoza   INVOKAMET XR [phone] MG Tb24 Generic drug:  Canagliflozin-metFORMIN HCl ER  TAKE 2 TABLETS DAILY WITH BREAKFAST BY MOUTH.   lisinopril 10 MG tablet Commonly known as:  PRINIVIL,ZESTRIL TAKE 1 TABLET BY MOUTH EVERY DAY   pravastatin 20 MG tablet Commonly known as:  PRAVACHOL TAKE 1 TABLET BY MOUTH EVERY DAY   tadalafil 20 MG tablet Commonly known as:  ADCIRCA/CIALIS TAKE 1 TABLET EVERY DAY AS NEEDED FOR ERECTILE DYSFUNCTION   VICTOZA 18 MG/3ML Sopn Generic drug:  liraglutide INJECT 0.3 MLS (1.8 MG TOTAL) DAILY INTO THE SKIN. INJECT ONCE DAILY AT THE SAME TIME       Allergies: No Known Allergies  Past Medical History:  Diagnosis Date  . Diabetes ([place])     No past surgical history on file.  Family History  Problem Relation Age of Onset  . Diabetes Mother   . Diabetes Father     Social History:  reports that he has never smoked. He has never used smokeless tobacco. His alcohol and drug histories are not on file.     Review of Systems        Lipids: treated with Lipitor 20 mg previously but this was stopped empirically because of leg stiffness;  Since his LDL was still 117 on his last visit despite better diet he was started back on a statin drug with pravastatin He thinks his diet is better Has lost weight, needs follow-up labs      Lab Results  Component Value Date   CHOL 192 12/19/2017   HDL 54.90 12/19/2017   LDLCALC 117 (H) 12/19/2017   TRIG 97.0 12/19/2017   CHOLHDL 3 12/19/2017       The blood pressure has been controlled with 10 mg lisinopril However he has not taken this for a couple of days probably in the pressure is higher He is not monitoring at home  BP Readings from Last 3 Encounters:  04/23/18 (!) 132/92  03/04/18 135/78  12/21/17 979/89      Complications: Erectile dysfunction. Treated with Cialis 20mg  as needed    Diabetic foot exam in 11/18 showed normal monofilament sensation, Decreased pulses in feet, calluses Rt distal foot   Physical Examination:  BP (!) 132/92   Pulse 86   Ht 6' (1.829 m)   Wt (!) 301 lb (136.5 kg)   SpO2 94%   BMI 40.82 kg/m   Repeat blood pressure with large cuff 130/90 right arm   ASSESSMENT:  Diabetes type 2, with BMI 41  See history of present illness for detailed discussion of current diabetes management, blood sugar patterns and problems identified  A1c is consistently in the normal range although probably lower than expected for his blood sugars  He has done well with consistent diet and recently has been trying to walk 2 miles a day Still benefiting from taking both Victoza and Invokana  However checking blood sugars only sporadically at home  No change in medications recommended  HYPERTENSION:  His blood pressure is unusually high and may be related to not taking his lisinopril for last couple of days Encouraged him to check blood pressure at home also   LIPIDS: Needs follow-up labs since starting  pravastatin   PLAN:   To check blood sugars consistently including after meals  Continue program of regular walking or other exercise  Watch saturated fats and carbohydrates in diet  He will take lisinopril consistently and follow-up with PCP for blood pressure  Follow-up in 3 months  Influenza vaccine given  Patient Instructions  Check blood sugars on waking up  days a week  Also check blood sugars about 2 hours after meals and do this after different meals by rotation  Recommended blood sugar levels on waking up are 90-130 and about 2 hours after meal is 130-160  Please bring your blood sugar monitor to each visit, thank you     Elayne Snare 04/23/2018, 4:55 PM   Note: This office note was prepared with Dragon voice recognition system technology. Any transcriptional errors that result from this process are unintentional.  ADDENDUM: His LDL is 100 We will need to increase pravastatin to 40 mg Also since his urine microalbumin is high at 97 we will increase his lisinopril to 20 mg

## 2018-04-23 NOTE — Patient Instructions (Signed)
Check blood sugars on waking up days a week  Also check blood sugars about 2 hours after meals and do this after different meals by rotation  Recommended blood sugar levels on waking up are 90-130 and about 2 hours after meal is 130-160  Please bring your blood sugar monitor to each visit, thank you   

## 2018-05-06 DIAGNOSIS — R6889 Other general symptoms and signs: Secondary | ICD-10-CM | POA: Diagnosis not present

## 2018-05-12 DIAGNOSIS — J209 Acute bronchitis, unspecified: Secondary | ICD-10-CM | POA: Diagnosis not present

## 2018-06-05 DIAGNOSIS — B353 Tinea pedis: Secondary | ICD-10-CM | POA: Diagnosis not present

## 2018-06-05 DIAGNOSIS — M79672 Pain in left foot: Secondary | ICD-10-CM | POA: Diagnosis not present

## 2018-06-05 DIAGNOSIS — M7672 Peroneal tendinitis, left leg: Secondary | ICD-10-CM | POA: Diagnosis not present

## 2018-06-05 DIAGNOSIS — L603 Nail dystrophy: Secondary | ICD-10-CM | POA: Diagnosis not present

## 2018-06-19 ENCOUNTER — Other Ambulatory Visit: Payer: Self-pay | Admitting: Endocrinology

## 2018-07-02 ENCOUNTER — Other Ambulatory Visit: Payer: Self-pay | Admitting: Endocrinology

## 2018-07-02 DIAGNOSIS — IMO0002 Reserved for concepts with insufficient information to code with codable children: Secondary | ICD-10-CM

## 2018-07-02 DIAGNOSIS — E1165 Type 2 diabetes mellitus with hyperglycemia: Secondary | ICD-10-CM

## 2018-07-04 DIAGNOSIS — L84 Corns and callosities: Secondary | ICD-10-CM | POA: Diagnosis not present

## 2018-07-04 DIAGNOSIS — M7751 Other enthesopathy of right foot: Secondary | ICD-10-CM | POA: Diagnosis not present

## 2018-07-04 DIAGNOSIS — L602 Onychogryphosis: Secondary | ICD-10-CM | POA: Diagnosis not present

## 2018-07-08 ENCOUNTER — Telehealth: Payer: Self-pay | Admitting: Endocrinology

## 2018-07-08 NOTE — Telephone Encounter (Signed)
MEDICATION: tadalafil (CIALIS) 20 MG tablet  PHARMACY:  [id number] - [place], [place]   IS THIS A 90 DAY SUPPLY :   IS PATIENT OUT OF MEDICATION:   IF NOT; HOW MUCH IS LEFT:   LAST APPOINTMENT DATE: @12 /04/2018  NEXT APPOINTMENT DATE:@1 /12/2018  DO WE HAVE YOUR PERMISSION TO LEAVE A DETAILED MESSAGE:  OTHER COMMENTS:  Patient is requesting Dr. Dwyane Dee to prescribe  **Let patient know to contact pharmacy at the end of the day to make sure medication is ready. **  ** Please notify patient to allow 48-72 hours to process**  **Encourage patient to contact the pharmacy for refills or they can request refills through [facility]**

## 2018-07-08 NOTE — Telephone Encounter (Signed)
This is not my prescription.  Needs to be done by PCP

## 2018-07-08 NOTE — Telephone Encounter (Signed)
Okay to refill? 

## 2018-07-09 NOTE — Telephone Encounter (Signed)
Pt.notified

## 2018-07-29 ENCOUNTER — Ambulatory Visit: Payer: 59 | Admitting: Endocrinology

## 2018-07-29 ENCOUNTER — Encounter: Payer: Self-pay | Admitting: Endocrinology

## 2018-07-29 VITALS — BP 132/74 | HR 86 | Ht 72.0 in | Wt 308.0 lb

## 2018-07-29 DIAGNOSIS — E1165 Type 2 diabetes mellitus with hyperglycemia: Secondary | ICD-10-CM | POA: Diagnosis not present

## 2018-07-29 DIAGNOSIS — E7800 Pure hypercholesterolemia, unspecified: Secondary | ICD-10-CM

## 2018-07-29 DIAGNOSIS — I10 Essential (primary) hypertension: Secondary | ICD-10-CM | POA: Diagnosis not present

## 2018-07-29 LAB — COMPREHENSIVE METABOLIC PANEL
ALBUMIN: 4.3 g/dL (ref 3.5–5.2)
ALK PHOS: 55 U/L (ref 39–117)
ALT: 12 U/L (ref 0–53)
AST: 19 U/L (ref 0–37)
BUN: 21 mg/dL (ref 6–23)
CALCIUM: 9.9 mg/dL (ref 8.4–10.5)
CO2: 24 mEq/L (ref 19–32)
Chloride: 105 mEq/L (ref 96–112)
Creatinine, Ser: 1 mg/dL (ref 0.40–1.50)
GFR: 101.55 mL/min (ref >60.00)
Glucose, Bld: 99 mg/dL (ref 70–99)
POTASSIUM: 4 meq/L (ref 3.5–5.1)
Sodium: 137 mEq/L (ref 135–145)
TOTAL PROTEIN: 7.5 g/dL (ref 6.0–8.3)
Total Bilirubin: 0.9 mg/dL (ref 0.2–1.2)

## 2018-07-29 LAB — MICROALBUMIN / CREATININE URINE RATIO
Creatinine,U: 116.2 mg/dL
MICROALB/CREAT RATIO: 52.6 mg/g — AB (ref 0.0–30.0)
Microalb, Ur: 61.1 mg/dL — ABNORMAL HIGH (ref 0.0–1.9)

## 2018-07-29 LAB — LIPID PANEL
CHOLESTEROL: 191 mg/dL (ref 0–200)
HDL: 60.6 mg/dL (ref >39.00)
LDL CALC: 108 mg/dL — AB (ref 0–99)
NonHDL: 130.13
TRIGLYCERIDES: 110 mg/dL (ref 0.0–149.0)
Total CHOL/HDL Ratio: 3
VLDL: 22 mg/dL (ref 0.0–40.0)

## 2018-07-29 LAB — POCT GLYCOSYLATED HEMOGLOBIN (HGB A1C): HEMOGLOBIN A1C: 5.5 % (ref 4.0–5.6)

## 2018-07-29 LAB — HEMOGLOBIN A1C: HEMOGLOBIN A1C: 5.5 % (ref 4.6–6.5)

## 2018-07-29 MED ORDER — TADALAFIL 20 MG PO TABS: ORAL_TABLET | ORAL | 2 refills | Status: DC

## 2018-07-29 NOTE — Progress Notes (Signed)
Patient ID: Gregory Fitzgerald, male   DOB: 1966/01/25, 53 y.o.   MRN: [id number]     Reason for Appointment: Follow up forType 2 Diabetes  History of Present Illness:          Diagnosis: Type 2 diabetes mellitus, date of diagnosis: 2006        Past history: At diagnosis he was not having any symptoms and glucose was discovered incidentally when he was found to be hypertensive He has been on metformin for several years and at some point was also taking Amaryl His blood sugars had been significantly high since about 2013 with A1c around 10% On his initial consultation he was taking 1.8 mg Victoza along with metformin ER 1500 mg daily Baseline A1c was 9.7% Because of his obesity he was started on Invokana 100 mg for better control; Amaryl 2 mg was added because of fasting hyperglycemia A1c had come down to 6.6 in October 2015  Recent history:   Non-insulin hypoglycemic drugs the patient is taking are: Invokamet XR 150/1000 mg daily, Victoza 1.8 mg daily  His blood sugars have been excellent previously with weight loss and lowest A1c was 5.1 but in 11/18 was 6.6  A1c is now normal, now 5.5 compared to 5.1   Current blood sugar patterns and management:  He has not checked his blood sugar at home until today  Also in the last month or so he has not been consistent with his diet with not avoiding higher carbohydrate or higher fat meals and has gained back 7 pounds  Also has been irregular with his walking which he was doing consistently previously  Has been consistent with his Invokamet and Victoza  Also has not had his labs done recently   Side effects from medications have been: None  GLUCOSE MONITORING: Done today only, readings of 94 and 107   Self-care:  .  His meals are usually  meat and vegetables. He will have low carbohydrate snacks          Exercise: 2 miles walking or [facility], recently restarting  Dietician visit: Most recent: 2008 .   Wt Readings from Last  3 Encounters:  07/29/18 (!) 308 lb (139.7 kg)  04/23/18 (!) 301 lb (136.5 kg)  12/21/17 (!) 319 lb 12.8 oz (145.1 kg)      Lab Results  Component Value Date   HGBA1C 5.5 07/29/2018   HGBA1C 5.1 04/23/2018   HGBA1C 5.1 04/23/2018   Lab Results  Component Value Date   MICROALBUR 124.8 (H) 04/23/2018   LDLCALC 100 (H) 04/23/2018   CREATININE 0.97 04/23/2018       OTHER active problems are discussed in review of systems               Allergies as of 07/29/2018   No Known Allergies     Medication List       Accurate as of July 29, 2018  4:50 PM. Always use your most recent med list.        aspirin 81 MG tablet Take 81 mg by mouth daily.   FREESTYLE LITE test strip Generic drug:  glucose blood USE TO CHECK BLOOD SUGAR 3 TIMES PER DAY DX CODE E11.65   Insulin Pen Needle 32G X 6 MM Misc Commonly known as:  NOVOFINE Use 1 per day to inject Victoza   Insulin Pen Needle 32G X 6 MM Misc Commonly known as:  NOVOFINE USE 1 PER DAY TO INJECT VICTOZA  INVOKAMET XR [phone] MG Tb24 Generic drug:  Canagliflozin-metFORMIN HCl ER TAKE 2 TABLETS DAILY WITH BREAKFAST BY MOUTH.   lisinopril 20 MG tablet Commonly known as:  PRINIVIL,ZESTRIL Take 1 tablet (20 mg total) by mouth daily.   pravastatin 40 MG tablet Commonly known as:  PRAVACHOL Take 1 tablet (40 mg total) by mouth daily.   tadalafil 20 MG tablet Commonly known as:  CIALIS TAKE 1 TABLET EVERY DAY AS NEEDED FOR ERECTILE DYSFUNCTION   VICTOZA 18 MG/3ML Sopn Generic drug:  liraglutide INJECT 0.3 MLS (1.8 MG TOTAL) DAILY INTO THE SKIN. INJECT ONCE DAILY AT THE SAME TIME       Allergies: No Known Allergies  Past Medical History:  Diagnosis Date  . Diabetes ([place])     History reviewed. No pertinent surgical history.  Family History  Problem Relation Age of Onset  . Diabetes Mother   . Diabetes Father     Social History:  reports that he has never smoked. He has never used smokeless tobacco. No  history on file for alcohol and drug.    Review of Systems        Lipids: treated with Lipitor 20 mg previously but this was stopped empirically because of leg stiffness;   Since his LDL was still 100 on his last visit he is now taking 40 mg of pravastatin instead of 20  He needs follow-up labs      Lab Results  Component Value Date   CHOL 170 04/23/2018   HDL 54.40 04/23/2018   LDLCALC 100 (H) 04/23/2018   TRIG 77.0 04/23/2018   CHOLHDL 3 04/23/2018       The blood pressure has been controlled with 20 mg lisinopril The dose was increased on his last visit because of higher microalbumin   He is not monitoring at home  BP Readings from Last 3 Encounters:  07/29/18 132/74  04/23/18 (!) 132/92  03/04/18 081/44      Complications: Erectile dysfunction. Treated with Cialis 20mg  as needed, asking for a prescription although he only gets partial benefit from this    Diabetic foot exam in 11/18 showed normal monofilament sensation, Decreased pulses in feet, calluses Rt distal foot   Physical Examination:  BP 132/74 (BP Location: Left Arm, Patient Position: Sitting, Cuff Size: Normal)   Pulse 86   Ht 6' (1.829 m)   Wt (!) 308 lb (139.7 kg)   SpO2 97%   BMI 41.77 kg/m       ASSESSMENT:  Diabetes type 2, with BMI 41  See history of present illness for discussion of current diabetes management, blood sugar patterns and problems identified  A1c is consistently in the normal range although again lower than expected for his actual blood sugars  He has not monitored his blood sugar at home Also because of celebrations he has not been watching his diet in the last few weeks and has gained back weight Also only recently is doing better with his exercise regimen  Currently doing fairly well with Invokamet and Victoza and A1c is excellent even though relatively higher than before  HYPERTENSION:  His blood pressure is improving with increased  lisinopril  Microalbuminuria: Needs follow-up today   LIPIDS: Needs follow-up labs today since starting pravastatin   PLAN:   To check blood sugars regularly including after meals  Restart regular exercise  No change in medications at this time  We will review labs when available    There are no Patient Instructions on file  for this visit.  Elayne Snare 07/29/2018, 4:50 PM   Note: This office note was prepared with Dragon voice recognition system technology. Any transcriptional errors that result from this process are unintentional.    4 Addendum: LDL above 100, needs to change from pravastatin to Crestor 10 mg

## 2018-07-30 ENCOUNTER — Other Ambulatory Visit: Payer: Self-pay

## 2018-07-30 MED ORDER — ROSUVASTATIN CALCIUM 10 MG PO TABS: 10.0000 mg | ORAL_TABLET | Freq: Every day | ORAL | 3 refills | Status: DC

## 2018-08-04 ENCOUNTER — Other Ambulatory Visit: Payer: Self-pay | Admitting: Endocrinology

## 2018-08-11 DIAGNOSIS — R6884 Jaw pain: Secondary | ICD-10-CM | POA: Diagnosis not present

## 2018-08-29 ENCOUNTER — Telehealth: Payer: Self-pay

## 2018-08-29 NOTE — Telephone Encounter (Signed)
PA initiated and approved for Invokamet XR 150-1000mg  ER tablets, take 2 tabs by mouth once daily. [employer] Sidles PA Case ID: [id number] Rx #: [id number]

## 2018-08-30 ENCOUNTER — Other Ambulatory Visit: Payer: Self-pay

## 2018-08-30 ENCOUNTER — Telehealth: Payer: Self-pay

## 2018-08-30 MED ORDER — EMPAGLIFLOZIN-METFORMIN HCL 12.5-1000 MG PO TABS: 2.0000 | ORAL_TABLET | Freq: Every day | ORAL | 3 refills | Status: DC

## 2018-08-30 NOTE — Telephone Encounter (Signed)
Insurance is denying coverage for Invokamet. Insurance prefers [place] or [place]. Please advise.

## 2018-08-30 NOTE — Telephone Encounter (Signed)
Called pt and informed him of the medication change. Pt verbalized understanding.

## 2018-08-30 NOTE — Telephone Encounter (Signed)
Switch to Synjardy XR 12.11/998, 2 tablets daily °

## 2018-09-05 ENCOUNTER — Encounter: Payer: Self-pay | Admitting: Family Medicine

## 2018-09-05 ENCOUNTER — Encounter: Payer: Self-pay | Admitting: Gastroenterology

## 2018-09-05 ENCOUNTER — Other Ambulatory Visit: Payer: Self-pay

## 2018-09-05 ENCOUNTER — Ambulatory Visit: Payer: 59 | Admitting: Family Medicine

## 2018-09-05 VITALS — BP 130/88 | HR 81 | Temp 98.1°F | Resp 15 | Ht 72.0 in | Wt 312.2 lb

## 2018-09-05 DIAGNOSIS — G500 Trigeminal neuralgia: Secondary | ICD-10-CM | POA: Diagnosis not present

## 2018-09-05 DIAGNOSIS — I10 Essential (primary) hypertension: Secondary | ICD-10-CM

## 2018-09-05 DIAGNOSIS — Z1211 Encounter for screening for malignant neoplasm of colon: Secondary | ICD-10-CM

## 2018-09-05 DIAGNOSIS — E119 Type 2 diabetes mellitus without complications: Secondary | ICD-10-CM

## 2018-09-05 DIAGNOSIS — E7800 Pure hypercholesterolemia, unspecified: Secondary | ICD-10-CM

## 2018-09-05 DIAGNOSIS — Z23 Encounter for immunization: Secondary | ICD-10-CM | POA: Diagnosis not present

## 2018-09-05 MED ORDER — CARBAMAZEPINE 200 MG PO TABS: 200.0000 mg | ORAL_TABLET | Freq: Two times a day (BID) | ORAL | 3 refills | Status: DC

## 2018-09-05 NOTE — Assessment & Plan Note (Signed)
Recent dx for pt.  Much improved since starting Carbamazepine.  Refill provided.

## 2018-09-05 NOTE — Patient Instructions (Signed)
Schedule your complete physical in 6 months We'll call you with your GI appt for colonoscopy consultation SCHEDULE your eye exam!  Have them send me their report Continue to work on healthy diet and regular exercise- you can do it! Continue the Carbamazepine twice daily for the trigeminal neuralgia Call with any questions or concerns Welcome!  We're glad to have you!!!

## 2018-09-05 NOTE — Assessment & Plan Note (Signed)
Chronic problem.  Adequate control today.  Asymptomatic.  Pt reports he forgot his medication this AM.  Reviewed recent labs.  No need to repeat.  Will continue to follow.

## 2018-09-05 NOTE — Assessment & Plan Note (Signed)
New to provider, ongoing for pt.  Reviewed recent lipid panel.  No need for med changes.  Will follow.

## 2018-09-05 NOTE — Addendum Note (Signed)
Addended by: Katina Dung on: 09/05/2018 04:32 PM   Modules accepted: Orders

## 2018-09-05 NOTE — Assessment & Plan Note (Signed)
Chronic problem.  Following w/ Dr Dwyane Dee.  Excellent control.  UTD on foot exam.  Due for eye exam.  Stressed need for healthy diet and regular exercise.  Will follow along and assist as able.

## 2018-09-05 NOTE — Assessment & Plan Note (Signed)
New to provider, ongoing for pt.  BMI 42.34.  Stressed need for healthy diet and regular exercise.  Will continue to follow.

## 2018-09-05 NOTE — Progress Notes (Signed)
   Subjective:    Patient ID: Gregory Fitzgerald, male    DOB: 1966-07-14, 53 y.o.   MRN: [id number]  HPI New to establish.  Previous MD- [facility]  DM- chronic problem, following w/ Dr Dwyane Dee.  On Synjardy (new) and Victoza.  Most recent A1C (January 2020) 5.5.  Due for eye exam.  On ACE for renal protection.  Sees [facility]- last visit December 2019.  Attempting to work on low carb diet and regular exercise.  HTN- chronic problem, on Lisinopril 20mg  daily.  Cr WNL.  No CP, SOB, HAs, visual changes, edema.  Hyperlipidemia- chronic problem, on Crestor.  Most recent LDL 108.  No abd pain, N/V.  Trigeminal Neuralgia- pt reports improvement w/ Carbamazepine 200mg  BID x2-3 weeks.  Dx'd at [facility].  Initially thought it was a tooth 'or a stroke'.  Obesity- BMI is 42.34  Health Maintenance- due for Tdap and GI referral.   Review of Systems For ROS see HPI     Objective:   Physical Exam Vitals signs reviewed.  Constitutional:      General: He is not in acute distress.    Appearance: He is well-developed. He is obese.  HENT:     Head: Normocephalic and atraumatic.  Eyes:     Conjunctiva/sclera: Conjunctivae normal.     Pupils: Pupils are equal, round, and reactive to light.  Neck:     Musculoskeletal: Normal range of motion and neck supple.     Thyroid: No thyromegaly.  Cardiovascular:     Rate and Rhythm: Normal rate and regular rhythm.     Heart sounds: Normal heart sounds. No murmur.  Pulmonary:     Effort: Pulmonary effort is normal. No respiratory distress.     Breath sounds: Normal breath sounds.  Abdominal:     General: Bowel sounds are normal. There is no distension.     Palpations: Abdomen is soft.  Lymphadenopathy:     Cervical: No cervical adenopathy.  Skin:    General: Skin is warm and dry.  Neurological:     Mental Status: He is alert and oriented to person, place, and time.     Cranial Nerves: No cranial nerve deficit.  Psychiatric:        Behavior:  Behavior normal.           Assessment & Plan:

## 2018-09-28 ENCOUNTER — Other Ambulatory Visit: Payer: Self-pay | Admitting: Endocrinology

## 2018-10-01 ENCOUNTER — Other Ambulatory Visit: Payer: Self-pay

## 2018-10-01 ENCOUNTER — Ambulatory Visit (AMBULATORY_SURGERY_CENTER): Payer: Self-pay | Admitting: *Deleted

## 2018-10-01 VITALS — Ht 73.0 in | Wt 316.0 lb

## 2018-10-01 DIAGNOSIS — Z1211 Encounter for screening for malignant neoplasm of colon: Secondary | ICD-10-CM

## 2018-10-01 MED ORDER — PEG 3350-KCL-NA BICARB-NACL 420 G PO SOLR: 4000.0000 mL | Freq: Once | ORAL | 0 refills | Status: AC

## 2018-10-01 NOTE — Progress Notes (Signed)
No egg or soy allergy known to patient  No issues with past sedation with any surgeries  or procedures, no intubation problems  No diet pills per patient No home 02 use per patient  No blood thinners per patient  Pt denies issues with constipation  No A fib or A flutter  EMMI video sent to pt's e mail - declined   

## 2018-10-03 DIAGNOSIS — D2372 Other benign neoplasm of skin of left lower limb, including hip: Secondary | ICD-10-CM | POA: Diagnosis not present

## 2018-10-03 DIAGNOSIS — E119 Type 2 diabetes mellitus without complications: Secondary | ICD-10-CM | POA: Diagnosis not present

## 2018-10-04 ENCOUNTER — Encounter: Payer: Self-pay | Admitting: Gastroenterology

## 2018-10-18 ENCOUNTER — Encounter: Payer: 59 | Admitting: Gastroenterology

## 2018-10-22 ENCOUNTER — Other Ambulatory Visit: Payer: Self-pay | Admitting: Endocrinology

## 2018-10-31 ENCOUNTER — Other Ambulatory Visit: Payer: Self-pay | Admitting: Endocrinology

## 2018-11-27 ENCOUNTER — Ambulatory Visit: Payer: 59 | Admitting: Endocrinology

## 2018-11-28 ENCOUNTER — Telehealth: Payer: Self-pay | Admitting: *Deleted

## 2018-11-28 NOTE — Telephone Encounter (Signed)
Ok per Dr. Rush Landmark to schedule colonoscopy now.  Attempted to reach pt to set this up, but unable to reach him or leave a message

## 2018-12-02 NOTE — Telephone Encounter (Signed)
Attempted to reach pt again- no answer and no machine to leave message

## 2018-12-04 DIAGNOSIS — L84 Corns and callosities: Secondary | ICD-10-CM | POA: Diagnosis not present

## 2018-12-04 DIAGNOSIS — E119 Type 2 diabetes mellitus without complications: Secondary | ICD-10-CM | POA: Diagnosis not present

## 2018-12-04 DIAGNOSIS — D2372 Other benign neoplasm of skin of left lower limb, including hip: Secondary | ICD-10-CM | POA: Diagnosis not present

## 2018-12-31 ENCOUNTER — Other Ambulatory Visit: Payer: Self-pay | Admitting: Family Medicine

## 2019-01-21 ENCOUNTER — Encounter: Payer: Self-pay | Admitting: Gastroenterology

## 2019-01-27 ENCOUNTER — Other Ambulatory Visit: Payer: Self-pay | Admitting: Endocrinology

## 2019-02-24 ENCOUNTER — Other Ambulatory Visit: Payer: Self-pay

## 2019-02-24 ENCOUNTER — Encounter: Payer: Self-pay | Admitting: Family Medicine

## 2019-02-24 ENCOUNTER — Ambulatory Visit (INDEPENDENT_AMBULATORY_CARE_PROVIDER_SITE_OTHER): Payer: 59 | Admitting: Family Medicine

## 2019-02-24 VITALS — BP 123/83 | HR 87 | Temp 97.9°F | Resp 17 | Ht 73.0 in | Wt 331.0 lb

## 2019-02-24 DIAGNOSIS — Z125 Encounter for screening for malignant neoplasm of prostate: Secondary | ICD-10-CM

## 2019-02-24 DIAGNOSIS — Z0000 Encounter for general adult medical examination without abnormal findings: Secondary | ICD-10-CM | POA: Diagnosis not present

## 2019-02-24 DIAGNOSIS — E119 Type 2 diabetes mellitus without complications: Secondary | ICD-10-CM | POA: Diagnosis not present

## 2019-02-24 NOTE — Assessment & Plan Note (Signed)
Pt's PE WNL w/ exception of obesity.  Due for colonoscopy- pt to call and reschedule.  Check labs.  Anticipatory guidance provided.

## 2019-02-24 NOTE — Patient Instructions (Addendum)
Follow up in 6 months to recheck BP, cholesterol, and weight loss progress We'll notify you of your lab results and make any changes if needed Continue to work on healthy diet and regular exercise- you can do it! Call and reschedule your Colonoscopy Call and schedule an eye exam and have them send me a copy of their note Call with any questions or concerns Stay Safe! Happy Early Rud[place]a!

## 2019-02-24 NOTE — Progress Notes (Signed)
   Subjective:    Patient ID: Gregory Fitzgerald, male    DOB: [birth year], 53 y.o.   MRN: [id number]  HPI CPE- due for colonoscopy (pt to call reschedule due to [place]), due for eye exam.  Overdue on A1C.  Pt has gained 15 lbs since March- BMI now 43.67  Pt has not been able to exercise b/c gym is closed.   Review of Systems Patient reports no vision/hearing changes, anorexia, fever ,adenopathy, persistant/recurrent hoarseness, swallowing issues, chest pain, palpitations, edema, persistant/recurrent cough, hemoptysis, dyspnea (rest,exertional, paroxysmal nocturnal), gastrointestinal  bleeding (melena, rectal bleeding), abdominal pain, excessive heart burn, GU symptoms (dysuria, hematuria, voiding/incontinence issues) syncope, focal weakness, memory loss, numbness & tingling, skin/hair/nail changes, depression, anxiety, abnormal bruising/bleeding, musculoskeletal symptoms/signs.     Objective:   Physical Exam BP 123/83   Pulse 87   Temp 97.9 F (36.6 C) (Tympanic)   Resp 17   Ht 6\' 1"  (1.854 m)   Wt (!) 331 lb (150.1 kg)   SpO2 97%   BMI 43.67 kg/m   General Appearance:    Alert, cooperative, no distress, appears stated age, morbidly obese  Head:    Normocephalic, without obvious abnormality, atraumatic  Eyes:    PERRL, conjunctiva/corneas clear, EOM's intact, fundi    benign, both eyes       Ears:    Normal TM's and external ear canals, both ears  Nose:   Deferred due to COVID  Throat:   Neck:   Supple, symmetrical, trachea midline, no adenopathy;       thyroid:  No enlargement/tenderness/nodules  Back:     Symmetric, no curvature, ROM normal, no CVA tenderness  Lungs:     Clear to auscultation bilaterally, respirations unlabored  Chest wall:    No tenderness or deformity  Heart:    Regular rate and rhythm, S1 and S2 normal, no murmur, rub   or gallop  Abdomen:     Soft, non-tender, bowel sounds active all four quadrants,    no masses, no organomegaly  Genitalia:    Normal male  without lesion, discharge or tenderness  Rectal:    Normal tone, normal prostate, no masses or tenderness  Extremities:   Extremities normal, atraumatic, no cyanosis or edema  Pulses:   2+ and symmetric all extremities  Skin:   Skin color, texture, turgor normal, no rashes or lesions  Lymph nodes:   Cervical, supraclavicular, and axillary nodes normal  Neurologic:   CNII-XII intact. Normal strength, sensation and reflexes      throughout          Assessment & Plan:

## 2019-02-24 NOTE — Assessment & Plan Note (Signed)
Chronic problem.  Overdue for followup w/ Dr Dwyane Dee.  Check A1C.  Encouraged pt to schedule eye exam.

## 2019-02-24 NOTE — Assessment & Plan Note (Signed)
Deteriorated.  Pt has gained 15 lbs since last visit.  Stressed need for healthy diet and regular exercise.  Will continue to follow.

## 2019-02-25 LAB — CBC WITH DIFFERENTIAL/PLATELET
Basophils Absolute: 0.1 10*3/uL (ref 0.0–0.1)
Basophils Relative: 1 % (ref 0.0–3.0)
Eosinophils Absolute: 0.3 10*3/uL (ref 0.0–0.7)
Eosinophils Relative: 6.3 % — ABNORMAL HIGH (ref 0.0–5.0)
HCT: 42.9 % (ref 39.0–52.0)
Hemoglobin: 14.5 g/dL (ref 13.0–17.0)
Lymphocytes Relative: 25.5 % (ref 12.0–46.0)
Lymphs Abs: 1.4 10*3/uL (ref 0.7–4.0)
MCHC: 33.9 g/dL (ref 30.0–36.0)
MCV: 98.1 fl (ref 78.0–100.0)
Monocytes Absolute: 0.6 10*3/uL (ref 0.1–1.0)
Monocytes Relative: 10.8 % (ref 3.0–12.0)
Neutro Abs: 3 10*3/uL (ref 1.4–7.7)
Neutrophils Relative %: 56.4 % (ref 43.0–77.0)
Platelets: 188 10*3/uL (ref 150.0–400.0)
RBC: 4.37 Mil/uL (ref 4.22–5.81)
RDW: 12.4 % (ref 11.5–15.5)
WBC: 5.4 10*3/uL (ref 4.0–10.5)

## 2019-02-25 LAB — BASIC METABOLIC PANEL
BUN: 18 mg/dL (ref 6–23)
CO2: 28 mEq/L (ref 19–32)
Calcium: 9.8 mg/dL (ref 8.4–10.5)
Chloride: 104 mEq/L (ref 96–112)
Creatinine, Ser: 0.91 mg/dL (ref 0.40–1.50)
GFR: 106.28 mL/min (ref >60.00)
Glucose, Bld: 177 mg/dL — ABNORMAL HIGH (ref 70–99)
Potassium: 4.3 mEq/L (ref 3.5–5.1)
Sodium: 140 mEq/L (ref 135–145)

## 2019-02-25 LAB — TSH: TSH: 1.62 u[IU]/mL (ref 0.35–4.50)

## 2019-02-25 LAB — PSA: PSA: 0.35 ng/mL (ref 0.10–4.00)

## 2019-02-25 LAB — LIPID PANEL
Cholesterol: 204 mg/dL — ABNORMAL HIGH (ref 0–200)
HDL: 56.1 mg/dL (ref >39.00)
LDL Cholesterol: 116 mg/dL — ABNORMAL HIGH (ref 0–99)
NonHDL: 148.16
Total CHOL/HDL Ratio: 4
Triglycerides: 161 mg/dL — ABNORMAL HIGH (ref 0.0–149.0)
VLDL: 32.2 mg/dL (ref 0.0–40.0)

## 2019-02-25 LAB — HEPATIC FUNCTION PANEL
ALT: 24 U/L (ref 0–53)
AST: 24 U/L (ref 0–37)
Albumin: 4.2 g/dL (ref 3.5–5.2)
Alkaline Phosphatase: 70 U/L (ref 39–117)
Bilirubin, Direct: 0.1 mg/dL (ref 0.0–0.3)
Total Bilirubin: 0.4 mg/dL (ref 0.2–1.2)
Total Protein: 6.7 g/dL (ref 6.0–8.3)

## 2019-02-25 LAB — HEMOGLOBIN A1C: Hgb A1c MFr Bld: 6.8 % — ABNORMAL HIGH (ref 4.6–6.5)

## 2019-02-27 ENCOUNTER — Encounter: Payer: Self-pay | Admitting: Endocrinology

## 2019-03-04 ENCOUNTER — Telehealth: Payer: Self-pay | Admitting: General Practice

## 2019-03-04 NOTE — Telephone Encounter (Signed)
Pt would like a letter stating that it is ok to have a letter to continue working out. [place] for letter?

## 2019-03-05 ENCOUNTER — Encounter: Payer: Self-pay | Admitting: Family Medicine

## 2019-03-05 NOTE — Telephone Encounter (Signed)
[place] for letter to workout

## 2019-03-05 NOTE — Telephone Encounter (Signed)
Letter printed and placed at front desk.  Pt wife informed.

## 2019-03-28 ENCOUNTER — Encounter: Payer: Self-pay | Admitting: Gastroenterology

## 2019-03-28 ENCOUNTER — Other Ambulatory Visit: Payer: Self-pay

## 2019-03-28 ENCOUNTER — Telehealth: Payer: Self-pay

## 2019-03-28 DIAGNOSIS — E1165 Type 2 diabetes mellitus with hyperglycemia: Secondary | ICD-10-CM

## 2019-03-28 DIAGNOSIS — IMO0002 Reserved for concepts with insufficient information to code with codable children: Secondary | ICD-10-CM

## 2019-03-28 NOTE — Telephone Encounter (Signed)
Patient's wife called in stating that patient needed another referral to ortho for an epidural.   Patient was seen by Dr. Nelva Bush with [facility] who then sent patient to [facility] for an epidural. Patient was told he would probably need two injections, but the first one has lasted almost a year. Wife wanted an appt ASAP with PCP. I informed patient that PCP did not have any openings today and was out of the office all of next week. I asked if patient had reached out to either [facility] or [facility], she stated "no, it took forever to have him scheduled last time, they don't understand his pain! I know your office can get him in faster."   I informed patient to call [facility] and/or [facility] to see if patient needed a new referral or if he could be scheduled and that [place] placing a new referral will not get him a quicker appt.

## 2019-03-28 NOTE — Telephone Encounter (Signed)
I am not sure if pt needs a referral to [facility] or [facility].  He can have either for the dx of low back pain in order to have an epidural injxn.

## 2019-04-01 NOTE — Telephone Encounter (Signed)
Left several messages asking patient or wife to call back to discuss referral.

## 2019-04-23 ENCOUNTER — Other Ambulatory Visit: Payer: Self-pay | Admitting: Endocrinology

## 2019-05-02 ENCOUNTER — Ambulatory Visit (AMBULATORY_SURGERY_CENTER): Payer: Self-pay | Admitting: *Deleted

## 2019-05-02 ENCOUNTER — Other Ambulatory Visit: Payer: Self-pay

## 2019-05-02 VITALS — Temp 96.9°F | Ht 73.0 in | Wt 326.0 lb

## 2019-05-02 DIAGNOSIS — Z1211 Encounter for screening for malignant neoplasm of colon: Secondary | ICD-10-CM

## 2019-05-02 MED ORDER — PEG 3350-KCL-NA BICARB-NACL 420 G PO SOLR: 4000.0000 mL | Freq: Once | ORAL | 0 refills | Status: AC

## 2019-05-02 NOTE — Progress Notes (Signed)

## 2019-05-06 ENCOUNTER — Encounter: Payer: Self-pay | Admitting: Gastroenterology

## 2019-05-15 ENCOUNTER — Telehealth: Payer: Self-pay

## 2019-05-15 NOTE — Telephone Encounter (Signed)
Covid-19 screening questions   Do you now or have you had a fever in the last 14 days?  Do you have any respiratory symptoms of shortness of breath or cough now or in the last 14 days?  Do you have any family members or close contacts with diagnosed or suspected Covid-19 in the past 14 days?  Have you been tested for Covid-19 and found to be positive?       

## 2019-05-16 ENCOUNTER — Ambulatory Visit (AMBULATORY_SURGERY_CENTER): Payer: 59 | Admitting: Gastroenterology

## 2019-05-16 ENCOUNTER — Other Ambulatory Visit (INDEPENDENT_AMBULATORY_CARE_PROVIDER_SITE_OTHER): Payer: 59

## 2019-05-16 ENCOUNTER — Other Ambulatory Visit: Payer: Self-pay

## 2019-05-16 ENCOUNTER — Other Ambulatory Visit: Payer: Self-pay | Admitting: Gastroenterology

## 2019-05-16 ENCOUNTER — Encounter: Payer: Self-pay | Admitting: Gastroenterology

## 2019-05-16 VITALS — BP 125/79 | HR 65 | Temp 97.8°F | Resp 22 | Ht 73.0 in | Wt 326.0 lb

## 2019-05-16 DIAGNOSIS — D123 Benign neoplasm of transverse colon: Secondary | ICD-10-CM | POA: Diagnosis not present

## 2019-05-16 DIAGNOSIS — Z1211 Encounter for screening for malignant neoplasm of colon: Secondary | ICD-10-CM

## 2019-05-16 DIAGNOSIS — E1165 Type 2 diabetes mellitus with hyperglycemia: Secondary | ICD-10-CM

## 2019-05-16 DIAGNOSIS — E7800 Pure hypercholesterolemia, unspecified: Secondary | ICD-10-CM | POA: Diagnosis not present

## 2019-05-16 DIAGNOSIS — D128 Benign neoplasm of rectum: Secondary | ICD-10-CM

## 2019-05-16 DIAGNOSIS — D129 Benign neoplasm of anus and anal canal: Secondary | ICD-10-CM

## 2019-05-16 LAB — COMPREHENSIVE METABOLIC PANEL
ALT: 26 U/L (ref 0–53)
AST: 24 U/L (ref 0–37)
Albumin: 4.4 g/dL (ref 3.5–5.2)
Alkaline Phosphatase: 65 U/L (ref 39–117)
BUN: 15 mg/dL (ref 6–23)
CO2: 25 mEq/L (ref 19–32)
Calcium: 9.5 mg/dL (ref 8.4–10.5)
Chloride: 101 mEq/L (ref 96–112)
Creatinine, Ser: 0.9 mg/dL (ref 0.40–1.50)
GFR: 107.55 mL/min (ref >60.00)
Glucose, Bld: 125 mg/dL — ABNORMAL HIGH (ref 70–99)
Potassium: 4.1 mEq/L (ref 3.5–5.1)
Sodium: 136 mEq/L (ref 135–145)
Total Bilirubin: 0.7 mg/dL (ref 0.2–1.2)
Total Protein: 7.4 g/dL (ref 6.0–8.3)

## 2019-05-16 LAB — LIPID PANEL
Cholesterol: 173 mg/dL (ref 0–200)
HDL: 54 mg/dL (ref >39.00)
LDL Cholesterol: 100 mg/dL — ABNORMAL HIGH (ref 0–99)
NonHDL: 119.04
Total CHOL/HDL Ratio: 3
Triglycerides: 97 mg/dL (ref 0.0–149.0)
VLDL: 19.4 mg/dL (ref 0.0–40.0)

## 2019-05-16 LAB — HEMOGLOBIN A1C: Hgb A1c MFr Bld: 6.1 % (ref 4.6–6.5)

## 2019-05-16 MED ORDER — SODIUM CHLORIDE 0.9 % IV SOLN: 500.0000 mL | Freq: Once | INTRAVENOUS | Status: DC

## 2019-05-16 NOTE — Progress Notes (Signed)
Called to room to assist during endoscopic procedure.  Patient ID and intended procedure confirmed with present staff. Received instructions for my participation in the procedure from the performing physician.  

## 2019-05-16 NOTE — Progress Notes (Signed)
Pt's states no medical or surgical changes since previsit or office visit. 

## 2019-05-16 NOTE — Progress Notes (Signed)
Report to PACU, RN, vss, BBS= Clear.  

## 2019-05-16 NOTE — Patient Instructions (Signed)
YOU HAD AN ENDOSCOPIC PROCEDURE TODAY AT THE [facility]:   Refer to the procedure report that was given to you for any specific questions about what was found during the examination.  If the procedure report does not answer your questions, please call your gastroenterologist to clarify.  If you requested that your care partner not be given the details of your procedure findings, then the procedure report has been included in a sealed envelope for you to review at your convenience later.  YOU SHOULD EXPECT: Some feelings of bloating in the abdomen. Passage of more gas than usual.  Walking can help get rid of the air that was put into your GI tract during the procedure and reduce the bloating. If you had a lower endoscopy (such as a colonoscopy or flexible sigmoidoscopy) you may notice spotting of blood in your stool or on the toilet paper. If you underwent a bowel prep for your procedure, you may not have a normal bowel movement for a few days.  Please Note:  You might notice some irritation and congestion in your nose or some drainage.  This is from the oxygen used during your procedure.  There is no need for concern and it should clear up in a day or so.  SYMPTOMS TO REPORT IMMEDIATELY:   Following lower endoscopy (colonoscopy or flexible sigmoidoscopy):  Excessive amounts of blood in the stool  Significant tenderness or worsening of abdominal pains  Swelling of the abdomen that is new, acute  Fever of 100F or higher  For urgent or emergent issues, a gastroenterologist can be reached at any hour by calling [phone].   DIET:  We do recommend a small meal at first, but then you may proceed to your regular diet.  Drink plenty of fluids but you should avoid alcoholic beverages for 24 hours.  ACTIVITY:  You should plan to take it easy for the rest of today and you should NOT DRIVE or use heavy machinery until tomorrow (because of the sedation medicines used during the test).     FOLLOW UP: Our staff will call the number listed on your records 48-72 hours following your procedure to check on you and address any questions or concerns that you may have regarding the information given to you following your procedure. If we do not reach you, we will leave a message.  We will attempt to reach you two times.  During this call, we will ask if you have developed any symptoms of COVID 19. If you develop any symptoms (ie: fever, flu-like symptoms, shortness of breath, cough etc.) before then, please call [phone].  If you test positive for Covid 19 in the 2 weeks post procedure, please call and report this information to us.    If any biopsies were taken you will be contacted by phone or by letter within the next 1-3 weeks.  Please call us at [phone] if you have not heard about the biopsies in 3 weeks.    SIGNATURES/CONFIDENTIALITY: You and/or your care partner have signed paperwork which will be entered into your electronic medical record.  These signatures attest to the fact that that the information above on your After Visit Summary has been reviewed and is understood.  Full responsibility of the confidentiality of this discharge information lies with you and/or your care-partner. 

## 2019-05-16 NOTE — Op Note (Signed)
[facility] Patient Name: Gregory Fitzgerald Procedure Date: 05/16/2019 8:11 AM MRN: [id number] Endoscopist: Justice Britain , MD Age: 53 Referring MD:  Date of Birth: [birth year] Gender: Male Account #: [id number] Procedure:                Colonoscopy Indications:              Screening for malignant neoplasm in the colon Medicines:                Monitored Anesthesia Care Procedure:                Pre-Anesthesia Assessment:                           - Prior to the procedure, a History and Physical                            was performed, and patient medications and                            allergies were reviewed. The patient's tolerance of                            previous anesthesia was also reviewed. The risks                            and benefits of the procedure and the sedation                            options and risks were discussed with the patient.                            All questions were answered, and informed consent                            was obtained. Prior Anticoagulants: The patient has                            taken no previous anticoagulant or antiplatelet                            agents. ASA Grade Assessment: II - A patient with                            mild systemic disease. After reviewing the risks                            and benefits, the patient was deemed in                            satisfactory condition to undergo the procedure.                           After obtaining informed consent, the colonoscope  was passed under direct vision. Throughout the                            procedure, the patient's blood pressure, pulse, and                            oxygen saturations were monitored continuously. The                            Colonoscope was introduced through the anus and                            advanced to the the cecum, identified by                            appendiceal orifice and  ileocecal valve. The                            colonoscopy was performed without difficulty. The                            patient tolerated the procedure. The quality of the                            bowel preparation was good. The ileocecal valve,                            appendiceal orifice, and rectum were photographed. Scope In: 8:16:26 AM Scope Out: 8:34:50 AM Scope Withdrawal Time: 0 hours 14 minutes 29 seconds  Total Procedure Duration: 0 hours 18 minutes 24 seconds  Findings:                 The digital rectal exam findings include                            hemorrhoids. Pertinent negatives include no                            palpable rectal lesions.                           Three sessile polyps were found in the rectum and                            transverse colon. The polyps were 2 to 4 mm in                            size. These polyps were removed with a cold snare.                            Resection and retrieval were complete.                           Normal mucosa was found in the entire colon  otherwise.                           Non-bleeding non-thrombosed internal hemorrhoids                            were found during retroflexion, during perianal                            exam and during digital exam. The hemorrhoids were                            Grade II (internal hemorrhoids that prolapse but                            reduce spontaneously). Complications:            No immediate complications. Estimated Blood Loss:     Estimated blood loss was minimal. Impression:               - Hemorrhoids found on digital rectal exam.                           - Three 2 to 4 mm polyps in the rectum and in the                            transverse colon, removed with a cold snare.                            Resected and retrieved.                           - Normal mucosa in the entire examined colon                             otherwise.                           - Non-bleeding non-thrombosed internal hemorrhoids. Recommendation:           - The patient will be observed post-procedure,                            until all discharge criteria are met.                           - Discharge patient to home.                           - Patient has a contact number available for                            emergencies. The signs and symptoms of potential                            delayed complications were discussed with the  patient. Return to normal activities tomorrow.                            Written discharge instructions were provided to the                            patient.                           - High fiber diet.                           - Use FiberCon 1 tablet PO daily.                           - Continue present medications.                           - Await pathology results.                           - Repeat colonoscopy 3/11/27/08 years for                            surveillance based on pathology results and                            findings of adenomatous tissue.                           - The findings and recommendations were discussed                            with the patient. Justice Britain, MD 05/16/2019 8:42:16 AM

## 2019-05-20 ENCOUNTER — Telehealth: Payer: Self-pay | Admitting: *Deleted

## 2019-05-20 ENCOUNTER — Other Ambulatory Visit: Payer: Self-pay | Admitting: Endocrinology

## 2019-05-20 ENCOUNTER — Encounter: Payer: Self-pay | Admitting: Gastroenterology

## 2019-05-20 ENCOUNTER — Ambulatory Visit: Payer: 59 | Admitting: Endocrinology

## 2019-05-20 ENCOUNTER — Encounter: Payer: Self-pay | Admitting: Endocrinology

## 2019-05-20 ENCOUNTER — Telehealth: Payer: Self-pay

## 2019-05-20 ENCOUNTER — Other Ambulatory Visit: Payer: Self-pay

## 2019-05-20 VITALS — BP 132/80 | HR 64 | Temp 98.5°F | Ht 73.0 in | Wt 323.0 lb

## 2019-05-20 DIAGNOSIS — R809 Proteinuria, unspecified: Secondary | ICD-10-CM

## 2019-05-20 DIAGNOSIS — E7800 Pure hypercholesterolemia, unspecified: Secondary | ICD-10-CM | POA: Diagnosis not present

## 2019-05-20 DIAGNOSIS — E1129 Type 2 diabetes mellitus with other diabetic kidney complication: Secondary | ICD-10-CM

## 2019-05-20 DIAGNOSIS — E1169 Type 2 diabetes mellitus with other specified complication: Secondary | ICD-10-CM

## 2019-05-20 DIAGNOSIS — Z23 Encounter for immunization: Secondary | ICD-10-CM | POA: Diagnosis not present

## 2019-05-20 DIAGNOSIS — E669 Obesity, unspecified: Secondary | ICD-10-CM

## 2019-05-20 NOTE — Patient Instructions (Signed)
Get eye exam  Walk 5 days/week  Check blood sugars on waking up 1-2 days a week  Also check blood sugars about 2 hours after meals and do this after different meals by rotation  Recommended blood sugar levels on waking up are 80-130 and about 2 hours after meal is 130-160  Please bring your blood sugar monitor to each visit, thank you

## 2019-05-20 NOTE — Telephone Encounter (Signed)
  Follow up Call-  Call back number 05/16/2019  Post procedure Call Back phone  # [phone]  Permission to leave phone message Yes  Some recent data might be hidden     Patient questions:  Do you have a fever, pain , or abdominal swelling? No. Pain Score  0 *  Have you tolerated food without any problems? Yes.    Have you been able to return to your normal activities? Yes.    Do you have any questions about your discharge instructions: Diet   No. Medications  No. Follow up visit  No.  Do you have questions or concerns about your Care? No.  Actions: * If pain score is 4 or above: No action needed, pain <4. 1. Have you developed a fever since your procedure? no  2.   Have you had an respiratory symptoms (SOB or cough) since your procedure? no  3.   Have you tested positive for COVID 19 since your procedure no  4.   Have you had any family members/close contacts diagnosed with the COVID 19 since your procedure?  no   If yes to any of these questions please route to Joylene John, RN and Alphonsa Gin, Therapist, sports.

## 2019-05-20 NOTE — Progress Notes (Signed)
Patient ID: Gregory Fitzgerald, male   DOB: Mar 08, 1966, 53 y.o.   MRN: [id number]     Reason for Appointment: Follow up forType 2 Diabetes  History of Present Illness:          Diagnosis: Type 2 diabetes mellitus, date of diagnosis: 2006        Past history: At diagnosis he was not having any symptoms and glucose was discovered incidentally when he was found to be hypertensive He has been on metformin for several years and at some point was also taking Amaryl His blood sugars had been significantly high since about 2013 with A1c around 10% On his initial consultation he was taking 1.8 mg Victoza along with metformin ER 1500 mg daily Baseline A1c was 9.7% Because of his obesity he was started on Invokana 100 mg for better control; Amaryl 2 mg was added because of fasting hyperglycemia A1c had come down to 6.6 in October 2015  Recent history:   Non-insulin hypoglycemic drugs the patient is taking are: Synjardy XR 12.11/998 mg daily, Victoza 1.8 mg daily  His blood sugars have been excellent previously with weight loss and lowest A1c was 5.1  A1c is now 6.1, previously 6.8    Current blood sugar patterns and management:  He has not been seen in follow-up since January  He says he misplaced his blood sugar meter and not clear how often he checks his readings  However he does not think his blood sugars are above target even after meals  He thinks he is very consistent with taking his Victoza every morning  He has been walking only a couple of times a week, previously had been going to the gym  His lab fasting glucose was 125  He has gained a significant amount of weight since his last visit in January   Side effects from medications have been: None  GLUCOSE MONITORING:   Previous home blood sugar range 80- 160, lowest readings after being active and highest reading after eating  Self-care:  .  His meals are usually  meat and vegetables. He will have low  carbohydrate snacks          Exercise: 2 miles walking, 2/7 days  Dietician visit: Most recent: 2008 .   Wt Readings from Last 3 Encounters:  05/20/19 (!) 323 lb (146.5 kg)  05/16/19 (!) 326 lb (147.9 kg)  05/02/19 (!) 326 lb (147.9 kg)      Lab Results  Component Value Date   HGBA1C 6.1 05/16/2019   HGBA1C 6.8 (H) 02/24/2019   HGBA1C 5.5 07/29/2018   Lab Results  Component Value Date   MICROALBUR 61.1 (H) 07/29/2018   LDLCALC 100 (H) 05/16/2019   CREATININE 0.90 05/16/2019       OTHER active problems are discussed in review of systems               Allergies as of 05/20/2019   No Known Allergies     Medication List       Accurate as of May 20, 2019  9:40 AM. If you have any questions, ask your nurse or doctor.        aspirin 81 MG tablet Take 81 mg by mouth daily.   carbamazepine 200 MG tablet Commonly known as: TEGRETOL TAKE 1 TABLET BY MOUTH TWICE A DAY   FREESTYLE LITE test strip Generic drug: glucose blood USE TO CHECK BLOOD SUGAR 3 TIMES PER DAY DX CODE E11.65   Insulin Pen  Needle 32G X 6 MM Misc Commonly known as: NovoFine Use 1 per day to inject Victoza   Insulin Pen Needle 32G X 6 MM Misc Commonly known as: NovoFine USE 1 PER DAY TO INJECT VICTOZA   lisinopril 20 MG tablet Commonly known as: ZESTRIL TAKE 1 TABLET BY MOUTH EVERY DAY   rosuvastatin 10 MG tablet Commonly known as: CRESTOR TAKE 1 TABLET (10 MG TOTAL) BY MOUTH DAILY. TAKE 1 TABLET BY MOUTH ONCE DAILY.   Synjardy 12.11-998 MG Tabs Generic drug: Empagliflozin-metFORMIN HCl TAKE 2 TABLETS BY MOUTH DAILY. TAKE 2 TABLETS BY MOUTH ONCE DAILY.   tadalafil 20 MG tablet Commonly known as: Cialis TAKE 1 TABLET EVERY DAY AS NEEDED FOR ERECTILE DYSFUNCTION   Victoza 18 MG/3ML Sopn Generic drug: liraglutide INJECT 0.3 MLS (1.8 MG TOTAL) DAILY INTO THE SKIN. INJECT ONCE DAILY AT THE SAME TIME       Allergies: No Known Allergies  Past Medical History:  Diagnosis Date  .  Arthritis    knees  . Diabetes ([place])   . GERD (gastroesophageal reflux disease)    years ago, not currently   . Hyperlipidemia   . Hypertension   . Trigeminal neuralgia     Past Surgical History:  Procedure Laterality Date  . club foot surgery     age 75 months  . KNEE SURGERY     x 2 in high school     Family History  Problem Relation Age of Onset  . Diabetes Mother   . Diabetes Father   . Lymphoma Father   . Colon cancer Neg Hx   . Colon polyps Neg Hx   . Esophageal cancer Neg Hx   . Rectal cancer Neg Hx   . Stomach cancer Neg Hx     Social History:  reports that he has never smoked. He has never used smokeless tobacco. He reports current alcohol use. He reports that he does not use drugs.    Review of Systems        Lipids: treated with Lipitor 20 mg previously but this was stopped empirically because of leg stiffness;   He is now taking 10 mg of Crestor without side effects, LDL is relatively better than on pravastatin      Lab Results  Component Value Date   CHOL 173 05/16/2019   CHOL 204 (H) 02/24/2019   CHOL 191 07/29/2018   Lab Results  Component Value Date   HDL 54.00 05/16/2019   HDL 56.10 02/24/2019   HDL 60.60 07/29/2018   Lab Results  Component Value Date   LDLCALC 100 (H) 05/16/2019   LDLCALC 116 (H) 02/24/2019   LDLCALC 108 (H) 07/29/2018   Lab Results  Component Value Date   TRIG 97.0 05/16/2019   TRIG 161.0 (H) 02/24/2019   TRIG 110.0 07/29/2018   Lab Results  Component Value Date   CHOLHDL 3 05/16/2019   CHOLHDL 4 02/24/2019   CHOLHDL 3 07/29/2018   No results found for: LDLDIRECT      The blood pressure has been controlled with 20 mg lisinopril Previous history of microalbuminuria   He is not monitoring at home  BP Readings from Last 3 Encounters:  05/20/19 132/80  05/16/19 125/79  02/24/19 0000000      Complications: Erectile dysfunction. Treated with Cialis 20mg  as needed, asking for a prescription although he  only gets partial benefit from this    Diabetic foot exam in 04/2019 showed normal monofilament sensation, Decreased pulses in feet,  calluses Rt distal foot He has been followed for his calluses elsewhere  Physical Examination:  BP 132/80 (BP Location: Left Arm, Patient Position: Sitting, Cuff Size: Normal)   Pulse 64   Temp 98.5 F (36.9 C) (Oral)   Ht 6\' 1"  (1.854 m)   Wt (!) 323 lb (146.5 kg)   SpO2 96%   BMI 42.61 kg/m     Diabetic Foot Exam - Simple   Simple Foot Form Diabetic Foot exam was performed with the following findings: Yes   Visual Inspection No deformities, no ulcerations, no other skin breakdown bilaterally: Yes See comments: Yes Sensation Testing Intact to touch and monofilament testing bilaterally: Yes Pulse Check See comments: Yes Comments Callus present on the outside of the right great toe base. Decreased pulses       ASSESSMENT:  Diabetes type 2, with BMI 42  See history of present illness for discussion of current diabetes management, blood sugar patterns and problems identified  A1c is consistently in the normal range although has been as high as 6.8 this year  Most likely his blood sugars are relatively higher because of weight gain He has not exercised as much as before He is on Synjardy XR now and Victoza He said that he is comfortable doing his Victoza in the morning and does not want to change to a different regimen   HYPERTENSION:  Well controlled    LIPIDS: LDL is 100, he will continue Crestor since he is tolerating this   PLAN:   To check blood sugars at home regularly and bring monitor on each visit  To check some readings after meals  Increase regular walking for exercise  No change in medications at this time  Follow-up in 4 months  We will check microalbumin on the next visit  Needs to schedule eye exam which is overdue  Influenza vaccine given    There are no Patient Instructions on file for this visit.   Elayne Snare 05/20/2019, 9:40 AM   Note: This office note was prepared with Dragon voice recognition system technology. Any transcriptional errors that result from this process are unintentional.    4 Addendum: LDL above 100, needs to change from pravastatin to Crestor 10 mg

## 2019-05-20 NOTE — Telephone Encounter (Signed)
  Follow up Call-  Call back number 05/16/2019  Post procedure Call Back phone  # [phone]  Permission to leave phone message Yes  Some recent data might be hidden     Patient questions:  Do you have a fever, pain , or abdominal swelling? No. Pain Score  0 *  Have you tolerated food without any problems? Yes.    Have you been able to return to your normal activities? Yes.    Do you have any questions about your discharge instructions: Diet   No. Medications  No. Follow up visit  No.  Do you have questions or concerns about your Care? No.  Actions: * If pain score is 4 or above: No action needed, pain <4.  1. Have you developed a fever since your procedure? no  2.   Have you had an respiratory symptoms (SOB or cough) since your procedure? no  3.   Have you tested positive for COVID 19 since your procedure no  4.   Have you had any family members/close contacts diagnosed with the COVID 19 since your procedure? no   If yes to any of these questions please route to Joylene John, RN and Alphonsa Gin, Therapist, sports.

## 2019-07-05 ENCOUNTER — Encounter (HOSPITAL_BASED_OUTPATIENT_CLINIC_OR_DEPARTMENT_OTHER): Payer: Self-pay | Admitting: Emergency Medicine

## 2019-07-05 ENCOUNTER — Emergency Department (HOSPITAL_BASED_OUTPATIENT_CLINIC_OR_DEPARTMENT_OTHER)
Admission: EM | Admit: 2019-07-05 | Discharge: 2019-07-05 | Disposition: A | Discharge status: Home / Self Care | Payer: 59 | Attending: Emergency Medicine | Admitting: Emergency Medicine

## 2019-07-05 ENCOUNTER — Emergency Department (HOSPITAL_BASED_OUTPATIENT_CLINIC_OR_DEPARTMENT_OTHER): Payer: 59

## 2019-07-05 ENCOUNTER — Other Ambulatory Visit: Payer: Self-pay

## 2019-07-05 DIAGNOSIS — E119 Type 2 diabetes mellitus without complications: Secondary | ICD-10-CM | POA: Diagnosis not present

## 2019-07-05 DIAGNOSIS — I10 Essential (primary) hypertension: Secondary | ICD-10-CM | POA: Diagnosis not present

## 2019-07-05 DIAGNOSIS — R05 Cough: Secondary | ICD-10-CM | POA: Insufficient documentation

## 2019-07-05 DIAGNOSIS — Z20828 Contact with and (suspected) exposure to other viral communicable diseases: Secondary | ICD-10-CM | POA: Diagnosis not present

## 2019-07-05 DIAGNOSIS — R509 Fever, unspecified: Secondary | ICD-10-CM | POA: Insufficient documentation

## 2019-07-05 DIAGNOSIS — Z79899 Other long term (current) drug therapy: Secondary | ICD-10-CM | POA: Diagnosis not present

## 2019-07-05 DIAGNOSIS — J069 Acute upper respiratory infection, unspecified: Secondary | ICD-10-CM | POA: Diagnosis not present

## 2019-07-05 DIAGNOSIS — Z7982 Long term (current) use of aspirin: Secondary | ICD-10-CM | POA: Diagnosis not present

## 2019-07-05 DIAGNOSIS — Z794 Long term (current) use of insulin: Secondary | ICD-10-CM | POA: Insufficient documentation

## 2019-07-05 DIAGNOSIS — M7918 Myalgia, other site: Secondary | ICD-10-CM | POA: Diagnosis present

## 2019-07-05 LAB — SARS CORONAVIRUS 2 AG (30 MIN TAT): SARS Coronavirus 2 Ag: NEGATIVE

## 2019-07-05 MED ORDER — AZITHROMYCIN 250 MG PO TABS: 250.0000 mg | ORAL_TABLET | Freq: Every day | ORAL | 0 refills | Status: DC

## 2019-07-05 MED ORDER — GUAIFENESIN-DM 100-10 MG/5ML PO SYRP: 5.0000 mL | ORAL_SOLUTION | Freq: Three times a day (TID) | ORAL | 0 refills | Status: DC | PRN

## 2019-07-05 NOTE — ED Triage Notes (Signed)
Pt here with cough, subjective fever and generalized body aches since last Friday. Neg Covid test last Saturday.

## 2019-07-05 NOTE — ED Provider Notes (Signed)
[facility] EMERGENCY DEPARTMENT Provider Note   CSN: [id number] Arrival date & time: 07/05/19  0744     History Chief Complaint  Patient presents with  . Generalized Body Aches  . Cough  . Fever    Gregory Fitzgerald is a 53 y.o. male.  HPI Patient presents with cough subjective fever aches and chills for around the last week.  A week ago had a negative Covid test through his PCP.  I took a day or 2 to come back.  Complaining of pain in his lower chest upper abdomen now.  Thinks it is from all the coughing.  No known sick contacts.  States he is been able to eat but has had a somewhat decreased appetite.  No nausea or vomiting.  No diarrhea.  No dysuria.  Nonproductive cough.  Has not lost sense of taste or smell.    Past Medical History:  Diagnosis Date  . Arthritis    knees  . Diabetes ([place])   . GERD (gastroesophageal reflux disease)    years ago, not currently   . Hyperlipidemia   . Hypertension   . Trigeminal neuralgia     Patient Active Problem List   Diagnosis Date Noted  . Physical exam 02/24/2019  . Morbid obesity ([place]) 09/05/2018  . Trigeminal neuralgia   . Diabetes mellitus type II, controlled ([place]) 02/23/2014  . Hypercholesterolemia 02/23/2014  . Essential hypertension, benign 02/23/2014  . Erectile dysfunction due to diseases classified elsewhere 02/23/2014    Past Surgical History:  Procedure Laterality Date  . club foot surgery     age 44 months  . KNEE SURGERY     x 2 in high school        Family History  Problem Relation Age of Onset  . Diabetes Mother   . Diabetes Father   . Lymphoma Father   . Colon cancer Neg Hx   . Colon polyps Neg Hx   . Esophageal cancer Neg Hx   . Rectal cancer Neg Hx   . Stomach cancer Neg Hx     Social History   Tobacco Use  . Smoking status: Never Smoker  . Smokeless tobacco: Never Used  Substance Use Topics  . Alcohol use: Yes    Comment: occasionally   . Drug use: Never    Home  Medications Prior to Admission medications   Medication Sig Start Date End Date Taking? Authorizing Provider  aspirin 81 MG tablet Take 81 mg by mouth daily.    [provider]  carbamazepine (TEGRETOL) 200 MG tablet TAKE 1 TABLET BY MOUTH TWICE A DAY 12/31/18   Midge Minium, MD  FREESTYLE LITE test strip USE TO CHECK BLOOD SUGAR 3 TIMES PER DAY DX CODE E11.65 02/21/17   Elayne Snare, MD  Insulin Pen Needle (NOVOFINE) 32G X 6 MM MISC Use 1 per day to inject Victoza 06/18/17   Elayne Snare, MD  Insulin Pen Needle (NOVOFINE) 32G X 6 MM MISC USE 1 PER DAY TO INJECT VICTOZA 07/02/18   Elayne Snare, MD  lisinopril (ZESTRIL) 20 MG tablet TAKE 1 TABLET BY MOUTH EVERY DAY 01/27/19   Elayne Snare, MD  rosuvastatin (CRESTOR) 10 MG tablet TAKE 1 TABLET (10 MG TOTAL) BY MOUTH DAILY. TAKE 1 TABLET BY MOUTH ONCE DAILY. 05/20/19   Elayne Snare, MD  SYNJARDY 12.11-998 MG TABS TAKE 2 TABLETS BY MOUTH DAILY. TAKE 2 TABLETS BY MOUTH ONCE DAILY. 04/23/19   Elayne Snare, MD  tadalafil (CIALIS) 20  MG tablet TAKE 1 TABLET EVERY DAY AS NEEDED FOR ERECTILE DYSFUNCTION 07/29/18   Elayne Snare, MD  VICTOZA 18 MG/3ML SOPN INJECT 0.3 MLS (1.8 MG TOTAL) DAILY INTO THE SKIN. INJECT ONCE DAILY AT THE SAME TIME 10/31/18   Elayne Snare, MD    Allergies    Patient has no known allergies.  Review of Systems   Review of Systems  Constitutional: Positive for appetite change, fatigue and fever.  HENT: Negative for congestion.   Respiratory: Positive for cough and shortness of breath.   Cardiovascular: Positive for chest pain.  Gastrointestinal: Positive for abdominal distention.  Genitourinary: Negative for flank pain.  Musculoskeletal: Negative for back pain.  Skin: Negative for rash.  Neurological: Negative for weakness.  Psychiatric/Behavioral: Negative for confusion.    Physical Exam Updated Vital Signs BP (!) 150/92 (BP Location: Right Arm)   Pulse 80   Temp 98.6 F (37 C)   Resp 18   SpO2 95%   Physical  Exam Vitals and nursing note reviewed.  HENT:     Head: Normocephalic.  Eyes:     Pupils: Pupils are equal, round, and reactive to light.  Pulmonary:     Breath sounds: No wheezing, rhonchi or rales.  Abdominal:     Comments: Tenderness on left anterior lower chest/upper abdomen.  No rash.  Musculoskeletal:     Cervical back: Neck supple.     Right lower leg: No edema.     Left lower leg: No edema.  Skin:    General: Skin is warm.     Capillary Refill: Capillary refill takes less than 2 seconds.  Neurological:     Mental Status: He is alert and oriented to person, place, and time. Mental status is at baseline.     ED Results / Procedures / Treatments   Labs (all labs ordered are listed, but only abnormal results are displayed) Labs Reviewed  SARS CORONAVIRUS 2 AG (30 MIN TAT)    EKG None  Radiology No results found.  Procedures Procedures (including critical care time)  Medications Ordered in ED Medications - No data to display  ED Course  I have reviewed the triage vital signs and the nursing notes.  Pertinent labs & imaging results that were available during my care of the patient were reviewed by me and considered in my medical decision making (see chart for details).    MDM Rules/Calculators/A&P     CHA2DS2/VAS Stroke Risk Points      N/A >= 2 Points: High Risk  1 - 1.99 Points: Medium Risk  0 Points: Low Risk    A final score could not be computed because of missing components.: Last  Change: N/A     This score determines the patient's risk of having a stroke if the  patient has atrial fibrillation.      This score is not applicable to this patient. Components are not  calculated.                   Patient with cough fevers.  X-ray reassuring.  Covid antigen test negative.  Will check PCR.  With continued symptoms however feels patient benefit from a prescription for antibiotics to can fill in a few days if still have symptoms and Covid test is  negative.  Discharge home. Final Clinical Impression(s) / ED Diagnoses Final diagnoses:  None    Rx / DC Orders ED Discharge Orders    None       Davonna Belling,  MD 07/05/19 [phone]

## 2019-07-05 NOTE — Discharge Instructions (Addendum)
Take the azithromycin if in a few days your Covid test is negative and you are still having symptoms.  Take cough medicine as needed.  Follow-up with your doctor as needed.

## 2019-07-06 LAB — NOVEL CORONAVIRUS, NAA (HOSP ORDER, SEND-OUT TO REF LAB; TAT 18-24 HRS): SARS-CoV-2, NAA: NOT DETECTED

## 2019-07-07 ENCOUNTER — Ambulatory Visit (INDEPENDENT_AMBULATORY_CARE_PROVIDER_SITE_OTHER): Payer: 59 | Admitting: Family Medicine

## 2019-07-07 ENCOUNTER — Other Ambulatory Visit: Payer: Self-pay

## 2019-07-07 ENCOUNTER — Encounter: Payer: Self-pay | Admitting: Family Medicine

## 2019-07-07 DIAGNOSIS — J069 Acute upper respiratory infection, unspecified: Secondary | ICD-10-CM

## 2019-07-07 NOTE — Progress Notes (Signed)
Virtual Visit via Video   I connected with patient on 07/07/19 at  3:30 PM EST by a video enabled telemedicine application and verified that I am speaking with the correct person using two identifiers.  Location patient: Home Location provider: Acupuncturist, Office Persons participating in the virtual visit: Patient, Provider, [place] (Jess B)  I discussed the limitations of evaluation and management by telemedicine and the availability of in person appointments. The patient expressed understanding and agreed to proceed.  Subjective:   HPI:   URI- pt went to ER on 12/12 w/ subjective fever, chills, and body aches.  + nonproductive cough.  sxs started 1-2 weeks ago.  Reports the body aches were 'terrible' and the chest congestion 'was worse'.  Had both rapid COVID test and PCR- both negative.  Xray 'reassuring'.  Zpack prescribed.  'I feel a lot better'.  Pt feels abx have helped.  Cough has improved.  Body aches 'are a lot better'.  'I feel a lot better'.  No fever.  ROS:   See pertinent positives and negatives per HPI.  Patient Active Problem List   Diagnosis Date Noted  . Physical exam 02/24/2019  . Morbid obesity ([place]) 09/05/2018  . Trigeminal neuralgia   . Diabetes mellitus type II, controlled ([place]) 02/23/2014  . Hypercholesterolemia 02/23/2014  . Essential hypertension, benign 02/23/2014  . Erectile dysfunction due to diseases classified elsewhere 02/23/2014    Social History   Tobacco Use  . Smoking status: Never Smoker  . Smokeless tobacco: Never Used  Substance Use Topics  . Alcohol use: Yes    Comment: occasionally     Current Outpatient Medications:  .  aspirin 81 MG tablet, Take 81 mg by mouth daily., Disp: , Rfl:  .  azithromycin (ZITHROMAX) 250 MG tablet, Take 1 tablet (250 mg total) by mouth daily. Take first 2 tablets together, then 1 every day until finished., Disp: 6 tablet, Rfl: 0 .  carbamazepine (TEGRETOL) 200 MG tablet, TAKE 1 TABLET BY MOUTH  TWICE A DAY, Disp: 180 tablet, Rfl: 1 .  FREESTYLE LITE test strip, USE TO CHECK BLOOD SUGAR 3 TIMES PER DAY DX CODE E11.65, Disp: 100 each, Rfl: 3 .  guaiFENesin-dextromethorphan (ROBITUSSIN DM) 100-10 MG/5ML syrup, Take 5 mLs by mouth 3 (three) times daily as needed for cough., Disp: 118 mL, Rfl: 0 .  Insulin Pen Needle (NOVOFINE) 32G X 6 MM MISC, Use 1 per day to inject Victoza, Disp: 50 each, Rfl: 5 .  Insulin Pen Needle (NOVOFINE) 32G X 6 MM MISC, USE 1 PER DAY TO INJECT VICTOZA, Disp: 100 each, Rfl: 5 .  lisinopril (ZESTRIL) 20 MG tablet, TAKE 1 TABLET BY MOUTH EVERY DAY, Disp: 90 tablet, Rfl: 1 .  rosuvastatin (CRESTOR) 10 MG tablet, TAKE 1 TABLET (10 MG TOTAL) BY MOUTH DAILY. TAKE 1 TABLET BY MOUTH ONCE DAILY., Disp: 90 tablet, Rfl: 1 .  SYNJARDY 12.11-998 MG TABS, TAKE 2 TABLETS BY MOUTH DAILY. TAKE 2 TABLETS BY MOUTH ONCE DAILY., Disp: 60 tablet, Rfl: 3 .  tadalafil (CIALIS) 20 MG tablet, TAKE 1 TABLET EVERY DAY AS NEEDED FOR ERECTILE DYSFUNCTION, Disp: 10 tablet, Rfl: 2 .  VICTOZA 18 MG/3ML SOPN, INJECT 0.3 MLS (1.8 MG TOTAL) DAILY INTO THE SKIN. INJECT ONCE DAILY AT THE SAME TIME, Disp: 9 pen, Rfl: 5  No Known Allergies  Objective:   There were no vitals taken for this visit. AAOx3, NAD NCAT, EOMI No obvious CN deficits Coloring WNL Pt is able to speak  clearly, coherently without shortness of breath or increased work of breathing.  Thought process is linear.  Mood is appropriate.   Assessment and Plan:   URI- pt reports feeling much better since starting his Zpack and OTC Tussin.  Had 2 negative COVID tests.  Pt is back at work.  Reports those around him are healthy.  Reviewed supportive care and red flags that should prompt return.    Annye Asa, MD 07/07/2019

## 2019-07-07 NOTE — Progress Notes (Signed)
I have discussed the procedure for the virtual visit with the patient who has given consent to proceed with assessment and treatment.   Pt unable to obtain vitals.   Gregory Fitzgerald, CMA     

## 2019-08-30 ENCOUNTER — Other Ambulatory Visit: Payer: Self-pay | Admitting: Endocrinology

## 2019-09-01 ENCOUNTER — Other Ambulatory Visit: Payer: Self-pay

## 2019-09-01 ENCOUNTER — Encounter: Payer: Self-pay | Admitting: Family Medicine

## 2019-09-01 ENCOUNTER — Ambulatory Visit (INDEPENDENT_AMBULATORY_CARE_PROVIDER_SITE_OTHER): Payer: 59 | Admitting: Family Medicine

## 2019-09-01 DIAGNOSIS — E7800 Pure hypercholesterolemia, unspecified: Secondary | ICD-10-CM | POA: Diagnosis not present

## 2019-09-01 DIAGNOSIS — M79604 Pain in right leg: Secondary | ICD-10-CM

## 2019-09-01 DIAGNOSIS — I10 Essential (primary) hypertension: Secondary | ICD-10-CM | POA: Diagnosis not present

## 2019-09-01 DIAGNOSIS — M79605 Pain in left leg: Secondary | ICD-10-CM

## 2019-09-01 NOTE — Progress Notes (Signed)
I have discussed the procedure for the virtual visit with the patient who has given consent to proceed with assessment and treatment.   Pt unable to obtain vitals.   Estefanie Cornforth L Blyss Lugar, CMA     

## 2019-09-01 NOTE — Progress Notes (Signed)
Virtual Visit via Video   I connected with patient on 09/01/19 at  2:30 PM EST by a video enabled telemedicine application and verified that I am speaking with the correct person using two identifiers.  Location patient: Home Location provider: Acupuncturist, Office Persons participating in the virtual visit: Patient, Provider, [place] (Jess B)  I discussed the limitations of evaluation and management by telemedicine and the availability of in person appointments. The patient expressed understanding and agreed to proceed.  Subjective:   HPI:  Hyperlipidemia- chronic problem, on Crestor 10mg  daily.  Had to stop Crestor due to leg pain.  Pt reports that some of the pain has improved since stopping medication but AM stiffness persists.  Leg stiffness improves as day goes on.  'it's like my whole leg is stiff'.  sxs are bilateral.  HTN- chronic problem, on Lisinopril 20mg  daily.  No CP, SOB, HAs, visual changes, edema.   Obesity- ongoing issue for pt.  No regular exercise but does have to walk a mile to get from parking lot to work.  ROS:   See pertinent positives and negatives per HPI.  Patient Active Problem List   Diagnosis Date Noted  . Physical exam 02/24/2019  . Morbid obesity ([place]) 09/05/2018  . Trigeminal neuralgia   . Diabetes mellitus type II, controlled ([place]) 02/23/2014  . Hypercholesterolemia 02/23/2014  . Essential hypertension, benign 02/23/2014  . Erectile dysfunction due to diseases classified elsewhere 02/23/2014    Social History   Tobacco Use  . Smoking status: Never Smoker  . Smokeless tobacco: Never Used  Substance Use Topics  . Alcohol use: Yes    Comment: occasionally     Current Outpatient Medications:  .  aspirin 81 MG tablet, Take 81 mg by mouth daily., Disp: , Rfl:  .  BD PEN NEEDLE NANO U/F 32G X 4 MM MISC, USE 1 PER DAY TO INJECT VICTOZA, Disp: 100 each, Rfl: 4 .  carbamazepine (TEGRETOL) 200 MG tablet, TAKE 1 TABLET BY MOUTH TWICE A DAY,  Disp: 180 tablet, Rfl: 1 .  FREESTYLE LITE test strip, USE TO CHECK BLOOD SUGAR 3 TIMES PER DAY DX CODE E11.65, Disp: 100 each, Rfl: 3 .  Insulin Pen Needle (NOVOFINE) 32G X 6 MM MISC, Use 1 per day to inject Victoza, Disp: 50 each, Rfl: 5 .  Insulin Pen Needle (NOVOFINE) 32G X 6 MM MISC, USE 1 PER DAY TO INJECT VICTOZA, Disp: 100 each, Rfl: 5 .  lisinopril (ZESTRIL) 20 MG tablet, TAKE 1 TABLET BY MOUTH EVERY DAY, Disp: 90 tablet, Rfl: 1 .  SYNJARDY 12.11-998 MG TABS, TAKE 2 TABLETS BY MOUTH DAILY. TAKE 2 TABLETS BY MOUTH ONCE DAILY., Disp: 60 tablet, Rfl: 3 .  tadalafil (CIALIS) 20 MG tablet, TAKE 1 TABLET EVERY DAY AS NEEDED FOR ERECTILE DYSFUNCTION, Disp: 10 tablet, Rfl: 2 .  VICTOZA 18 MG/3ML SOPN, INJECT 0.3 MLS (1.8 MG TOTAL) DAILY INTO THE SKIN. INJECT ONCE DAILY AT THE SAME TIME, Disp: 9 pen, Rfl: 5 .  rosuvastatin (CRESTOR) 10 MG tablet, TAKE 1 TABLET (10 MG TOTAL) BY MOUTH DAILY. TAKE 1 TABLET BY MOUTH ONCE DAILY. (Patient not taking: Reported on 09/01/2019), Disp: 90 tablet, Rfl: 1  No Known Allergies  Objective:   There were no vitals taken for this visit.  AAOx3, NAD Obese NCAT, EOMI No obvious CN deficits Coloring WNL Pt is able to speak clearly, coherently without shortness of breath or increased work of breathing.  Thought process is linear.  Mood  is appropriate.   Assessment and Plan:   HTN- chronic problem.  Not able to check BP today.  Currently asymptomatic.  Check labs.  Will follow.  Hyperlipidemia- chronic problem.  Stopped statin in hopes this would improve his leg pain/stiffness.  It did not.  Check labs and determine if other intervention is needed  Obesity- pt is walking a mile into work and a mile back to his car Monday-Friday.  Encouraged him to add regular exercise to his daily schedule while attempting to follow his low carb, diabetes diet.  Will follow.  Leg Pain- new.  Pt reports bilateral leg stiffness.  Worse in the AM and then improves as day goes on.   This is concerning for possible rheumatologic process.  Check labs and determine next steps.   Annye Asa, MD 09/01/2019

## 2019-09-08 ENCOUNTER — Other Ambulatory Visit (INDEPENDENT_AMBULATORY_CARE_PROVIDER_SITE_OTHER): Payer: 59

## 2019-09-08 ENCOUNTER — Telehealth: Payer: Self-pay

## 2019-09-08 DIAGNOSIS — I10 Essential (primary) hypertension: Secondary | ICD-10-CM

## 2019-09-08 DIAGNOSIS — M79604 Pain in right leg: Secondary | ICD-10-CM

## 2019-09-08 DIAGNOSIS — E7800 Pure hypercholesterolemia, unspecified: Secondary | ICD-10-CM | POA: Diagnosis not present

## 2019-09-08 DIAGNOSIS — M79605 Pain in left leg: Secondary | ICD-10-CM

## 2019-09-08 LAB — CBC WITH DIFFERENTIAL/PLATELET
Basophils Absolute: 0 10*3/uL (ref 0.0–0.1)
Basophils Relative: 0.6 % (ref 0.0–3.0)
Eosinophils Absolute: 0.2 10*3/uL (ref 0.0–0.7)
Eosinophils Relative: 3.8 % (ref 0.0–5.0)
HCT: 46.7 % (ref 39.0–52.0)
Hemoglobin: 15.8 g/dL (ref 13.0–17.0)
Lymphocytes Relative: 24 % (ref 12.0–46.0)
Lymphs Abs: 1.2 10*3/uL (ref 0.7–4.0)
MCHC: 33.8 g/dL (ref 30.0–36.0)
MCV: 98.6 fl (ref 78.0–100.0)
Monocytes Absolute: 0.5 10*3/uL (ref 0.1–1.0)
Monocytes Relative: 9.2 % (ref 3.0–12.0)
Neutro Abs: 3.1 10*3/uL (ref 1.4–7.7)
Neutrophils Relative %: 62.4 % (ref 43.0–77.0)
Platelets: 187 10*3/uL (ref 150.0–400.0)
RBC: 4.73 Mil/uL (ref 4.22–5.81)
RDW: 13.1 % (ref 11.5–15.5)
WBC: 4.9 10*3/uL (ref 4.0–10.5)

## 2019-09-08 LAB — BASIC METABOLIC PANEL
BUN: 20 mg/dL (ref 6–23)
CO2: 23 mEq/L (ref 19–32)
Calcium: 9.4 mg/dL (ref 8.4–10.5)
Chloride: 104 mEq/L (ref 96–112)
Creatinine, Ser: 0.92 mg/dL (ref 0.40–1.50)
GFR: 104.73 mL/min (ref >60.00)
Glucose, Bld: 298 mg/dL — ABNORMAL HIGH (ref 70–99)
Potassium: 4.3 mEq/L (ref 3.5–5.1)
Sodium: 136 mEq/L (ref 135–145)

## 2019-09-08 LAB — HEPATIC FUNCTION PANEL
ALT: 16 U/L (ref 0–53)
AST: 15 U/L (ref 0–37)
Albumin: 3.9 g/dL (ref 3.5–5.2)
Alkaline Phosphatase: 71 U/L (ref 39–117)
Bilirubin, Direct: 0.1 mg/dL (ref 0.0–0.3)
Total Bilirubin: 0.5 mg/dL (ref 0.2–1.2)
Total Protein: 6.8 g/dL (ref 6.0–8.3)

## 2019-09-08 LAB — LIPID PANEL
Cholesterol: 201 mg/dL — ABNORMAL HIGH (ref 0–200)
HDL: 52 mg/dL (ref >39.00)
LDL Cholesterol: 115 mg/dL — ABNORMAL HIGH (ref 0–99)
NonHDL: 148.93
Total CHOL/HDL Ratio: 4
Triglycerides: 171 mg/dL — ABNORMAL HIGH (ref 0.0–149.0)
VLDL: 34.2 mg/dL (ref 0.0–40.0)

## 2019-09-08 LAB — SEDIMENTATION RATE: Sed Rate: 35 mm/hr — ABNORMAL HIGH (ref 0–20)

## 2019-09-08 LAB — TSH: TSH: 1.27 u[IU]/mL (ref 0.35–4.50)

## 2019-09-08 LAB — CK: Total CK: 120 U/L (ref 7–232)

## 2019-09-08 NOTE — Telephone Encounter (Signed)
Called patient to schedule 6 month CPE. Could not leave message because it says that it is unavailable.

## 2019-09-09 ENCOUNTER — Other Ambulatory Visit: Payer: Self-pay | Admitting: General Practice

## 2019-09-09 DIAGNOSIS — R700 Elevated erythrocyte sedimentation rate: Secondary | ICD-10-CM

## 2019-09-09 LAB — ANA: Anti Nuclear Antibody (ANA): NEGATIVE

## 2019-09-09 MED ORDER — EZETIMIBE 10 MG PO TABS: 10.0000 mg | ORAL_TABLET | Freq: Every day | ORAL | 1 refills | Status: DC

## 2019-09-10 ENCOUNTER — Other Ambulatory Visit: Payer: Self-pay | Admitting: General Practice

## 2019-09-10 ENCOUNTER — Encounter: Payer: Self-pay | Admitting: General Practice

## 2019-09-25 ENCOUNTER — Encounter: Payer: 59 | Admitting: Endocrinology

## 2019-09-25 ENCOUNTER — Other Ambulatory Visit: Payer: Self-pay

## 2019-09-25 ENCOUNTER — Encounter: Payer: Self-pay | Admitting: Endocrinology

## 2019-09-25 NOTE — Progress Notes (Signed)
This encounter was created in error - please disregard.

## 2020-01-08 ENCOUNTER — Other Ambulatory Visit: Payer: Self-pay | Admitting: Endocrinology

## 2020-01-23 ENCOUNTER — Other Ambulatory Visit: Payer: Self-pay | Admitting: Endocrinology

## 2020-01-27 ENCOUNTER — Encounter: Payer: Self-pay | Admitting: Endocrinology

## 2020-01-27 ENCOUNTER — Other Ambulatory Visit: Payer: Self-pay

## 2020-01-27 ENCOUNTER — Ambulatory Visit (INDEPENDENT_AMBULATORY_CARE_PROVIDER_SITE_OTHER): Payer: 59 | Admitting: Endocrinology

## 2020-01-27 VITALS — BP 126/86 | HR 75 | Ht 73.0 in | Wt 333.0 lb

## 2020-01-27 DIAGNOSIS — E669 Obesity, unspecified: Secondary | ICD-10-CM | POA: Diagnosis not present

## 2020-01-27 DIAGNOSIS — E1165 Type 2 diabetes mellitus with hyperglycemia: Secondary | ICD-10-CM | POA: Diagnosis not present

## 2020-01-27 DIAGNOSIS — E1169 Type 2 diabetes mellitus with other specified complication: Secondary | ICD-10-CM | POA: Diagnosis not present

## 2020-01-27 DIAGNOSIS — E7800 Pure hypercholesterolemia, unspecified: Secondary | ICD-10-CM

## 2020-01-27 MED ORDER — VICTOZA 18 MG/3ML ~~LOC~~ SOPN: 1.8000 mg | PEN_INJECTOR | Freq: Every day | SUBCUTANEOUS | 0 refills | Status: DC

## 2020-01-27 MED ORDER — FREESTYLE LITE TEST VI STRP: ORAL_STRIP | 3 refills | Status: DC

## 2020-01-27 MED ORDER — FLUVASTATIN SODIUM ER 80 MG PO TB24: 80.0000 mg | ORAL_TABLET | Freq: Every day | ORAL | 1 refills | Status: DC

## 2020-01-27 NOTE — Progress Notes (Signed)
Patient ID: Gregory Fitzgerald, male   DOB: [birth year], 54 y.o.   MRN: [id number]     Reason for Appointment: Endocrinology follow-up  History of Present Illness:          Diagnosis: Type 2 diabetes mellitus, date of diagnosis: 2006        Past history: At diagnosis he was not having any symptoms and glucose was discovered incidentally when he was found to be hypertensive He had been on metformin for several years and at some point was also taking Amaryl His blood sugars had been significantly high since about 2013 with A1c around 10% On his initial consultation he was taking 1.8 mg Victoza along with metformin ER 1500 mg daily Baseline A1c was 9.7% Because of his obesity he was started on Invokana 100 mg for better control; Amaryl 2 mg was added because of fasting hyperglycemia A1c had come down to 6.6 in October 2015  Recent history:   Non-insulin hypoglycemic drugs prescribed: Synjardy XR 12.11/998 mg , Victoza 1.8 mg daily  His blood sugars have been excellent previously and usually consistent  A1c is now 8.2 compared to 6.1 last year   Current blood sugar patterns and management:  He has not been seen in follow-up since 10/20 and again is irregular despite reminders  He says he is checking his blood sugars but he has not had a new prescription for test strips since 2018  He has not taken Victoza for several months because he ran out of prescription and did not ask for a new prescription  Also this weekend he ran out of [place]  He says he has not been able to exercise much because of knee pain as well as not being able to go to the gym  He also has gained 10 pounds  He is not watching his diet consistently and probably snacking more because of not using Victoza as well as staying home more in the pandemic  Usually takes his Victoza regularly in the morning and does not want to change to a weekly injection   Side effects from medications have been:  None  GLUCOSE MONITORING:   By recall recent range 100-200  Self-care:  .  His meals are usually  meat and vegetables. He will have low carbohydrate snacks          Dietician visit: Most recent: 2008 .   Wt Readings from Last 3 Encounters:  01/27/20 (!) 333 lb (151 kg)  05/20/19 (!) 323 lb (146.5 kg)  05/16/19 (!) 326 lb (147.9 kg)      Lab Results  Component Value Date   HGBA1C 8.2 (A) 01/28/2020   HGBA1C 6.1 05/16/2019   HGBA1C 6.8 (H) 02/24/2019   Lab Results  Component Value Date   MICROALBUR 61.1 (H) 07/29/2018   LDLCALC 115 (H) 09/08/2019   CREATININE 0.92 09/08/2019       OTHER active problems are discussed in review of systems               Allergies as of 01/27/2020   No Known Allergies     Medication List       Accurate as of January 27, 2020 11:59 PM. If you have any questions, ask your nurse or doctor.        aspirin 81 MG tablet Take 81 mg by mouth daily.   carbamazepine 200 MG tablet Commonly known as: TEGRETOL TAKE 1 TABLET BY MOUTH TWICE A DAY   ezetimibe 10  MG tablet Commonly known as: Zetia Take 1 tablet (10 mg total) by mouth daily.   fluvastatin XL 80 MG 24 hr tablet Commonly known as: Lescol XL Take 1 tablet (80 mg total) by mouth daily. Started by: Gregory Snare, Gregory Fitzgerald   FREESTYLE LITE test strip Generic drug: glucose blood Use as instructed bid What changed: See the new instructions. Changed by: Gregory Snare, Gregory Fitzgerald   Insulin Pen Needle 32G X 6 MM Misc Commonly known as: NovoFine Use 1 per day to inject Victoza   Insulin Pen Needle 32G X 6 MM Misc Commonly known as: NovoFine USE 1 PER DAY TO INJECT VICTOZA   BD Pen Needle Nano U/F 32G X 4 MM Misc Generic drug: Insulin Pen Needle USE 1 PER DAY TO INJECT VICTOZA   lisinopril 20 MG tablet Commonly known as: ZESTRIL TAKE 1 TABLET BY MOUTH EVERY DAY   rosuvastatin 10 MG tablet Commonly known as: CRESTOR TAKE 1 TABLET (10 MG TOTAL) BY MOUTH DAILY. TAKE 1 TABLET BY MOUTH ONCE  DAILY.   Synjardy 12.11-998 MG Tabs Generic drug: Empagliflozin-metFORMIN HCl 2 tablets daily.  Need appointment for further refills   tadalafil 20 MG tablet Commonly known as: Cialis TAKE 1 TABLET EVERY DAY AS NEEDED FOR ERECTILE DYSFUNCTION   Victoza 18 MG/3ML Sopn Generic drug: liraglutide Inject 0.3 mLs (1.8 mg total) into the skin daily. Inject once daily at the same time       Allergies: No Known Allergies  Past Medical History:  Diagnosis Date   Arthritis    knees   Diabetes (HCC)    GERD (gastroesophageal reflux disease)    years ago, not currently    Hyperlipidemia    Hypertension    Trigeminal neuralgia     Past Surgical History:  Procedure Laterality Date   club foot surgery     age 67 months   KNEE SURGERY     x 2 in high school     Family History  Problem Relation Age of Onset   Diabetes Mother    Diabetes Father    Lymphoma Father    Colon cancer Neg Hx    Colon polyps Neg Hx    Esophageal cancer Neg Hx    Rectal cancer Neg Hx    Stomach cancer Neg Hx     Social History:  reports that he has never smoked. He has never used smokeless tobacco. He reports current alcohol use. He reports that he does not use drugs.    Review of Systems        Lipids: treated with Lipitor 20 mg previously but this was stopped empirically because of leg stiffness;   He was taking 10 mg of Crestor without side effects on his last visit but he reported leg stiffness and discomfort to his PCP and is only on Zetia now LDL was not controlled on pravastatin previously      Lab Results  Component Value Date   CHOL 201 (H) 09/08/2019   CHOL 173 05/16/2019   CHOL 204 (H) 02/24/2019   Lab Results  Component Value Date   HDL 52.00 09/08/2019   HDL 54.00 05/16/2019   HDL 56.10 02/24/2019   Lab Results  Component Value Date   LDLCALC 115 (H) 09/08/2019   LDLCALC 100 (H) 05/16/2019   LDLCALC 116 (H) 02/24/2019   Lab Results  Component Value  Date   TRIG 171.0 (H) 09/08/2019   TRIG 97.0 05/16/2019   TRIG 161.0 (H) 02/24/2019  Lab Results  Component Value Date   CHOLHDL 4 09/08/2019   CHOLHDL 3 05/16/2019   CHOLHDL 4 02/24/2019   No results found for: LDLDIRECT      The blood pressure has been controlled with 20 mg lisinopril Has previous history of microalbuminuria    BP Readings from Last 3 Encounters:  01/27/20 126/86  07/05/19 (!) 150/92  05/20/19 622/63      Complications: Erectile dysfunction. Treated with Cialis 20mg  as needed    Diabetic foot exam in 04/2019 showed normal monofilament sensation, Decreased pulses in feet, calluses Rt distal foot He has been followed for his calluses elsewhere  Physical Examination:  BP 126/86 (BP Location: Left Arm, Patient Position: Sitting)    Pulse 75    Ht 6\' 1"  (1.854 m)    Wt (!) 333 lb (151 kg)    SpO2 94%    BMI 43.93 kg/m       ASSESSMENT:  Diabetes type 2, with BMI 44, non-insulin-dependent  See history of present illness for discussion of current diabetes management, blood sugar patterns and problems identified  A1c is much higher at 8.2  This is likely to be from his not taking Victoza for several months As before he is very inconsistent with his follow-up and has not followed up for his diabetes or requested refills Also now running out of the [place] Also without Victoza he has had weight gain He has not exercised as much as before  Again is comfortable doing his Victoza on a daily basis in the morning and does not want to change to a weekly regimen  Morbid obesity associated with diabetes and hypertension: He has difficulty losing weight and only once was able to bring his weight down below 300 with significant dietary changes He is interested in bariatric surgery   HYPERTENSION:  Well controlled   LIPIDS: LDL is last over 100 but apparently stopped his Crestor complaining of leg stiffness even though he was tolerating it well last  year Needs a statin drug along with Zetia   PLAN:   We will send new test strips as he likely has expired ones at home  Needs to bring his monitor for download on each visit  To check blood sugars alternating fasting and after meals regularly  Exercise as much as tolerated  Restart Victoza but need to start with 0.6 and then build up progressively to 1.8 dose every day  New prescription for Iva Boop has been sent  He will need to make regular appointments  Follow-up in 6 weeks with fructosamine  Given patient education information on bariatric surgery and will refer him to the surgical group  Trial of LESCOL XL 80 mg, which he can try once a week for couple of weeks and then increase to at least 3 doses a week  Continue Zetia     Patient Instructions  Start VICTOZA injection once daily at the same time of the day.   Dial the dose to 0.6 mg on the pen for the first 3 days       Gregory Fitzgerald 01/28/2020, 8:16 AM   Note: This office note was prepared with Dragon voice recognition system technology. Any transcriptional errors that result from this process are unintentional.

## 2020-01-27 NOTE — Patient Instructions (Addendum)
Start VICTOZA injection once daily at the same time of the day.   Dial the dose to 0.6 mg on the pen for the first 3 days

## 2020-01-28 LAB — POCT GLYCOSYLATED HEMOGLOBIN (HGB A1C): Hemoglobin A1C: 8.2 % — AB (ref 4.0–5.6)

## 2020-02-19 ENCOUNTER — Other Ambulatory Visit: Payer: Self-pay | Admitting: Endocrinology

## 2020-03-08 ENCOUNTER — Other Ambulatory Visit (INDEPENDENT_AMBULATORY_CARE_PROVIDER_SITE_OTHER): Payer: 59

## 2020-03-08 DIAGNOSIS — E7800 Pure hypercholesterolemia, unspecified: Secondary | ICD-10-CM

## 2020-03-08 DIAGNOSIS — E1165 Type 2 diabetes mellitus with hyperglycemia: Secondary | ICD-10-CM

## 2020-03-08 LAB — LIPID PANEL
Cholesterol: 166 mg/dL (ref 0–200)
HDL: 49.8 mg/dL (ref >39.00)
LDL Cholesterol: 92 mg/dL (ref 0–99)
NonHDL: 116.14
Total CHOL/HDL Ratio: 3
Triglycerides: 123 mg/dL (ref 0.0–149.0)
VLDL: 24.6 mg/dL (ref 0.0–40.0)

## 2020-03-09 ENCOUNTER — Ambulatory Visit: Payer: 59 | Admitting: Endocrinology

## 2020-03-09 LAB — FRUCTOSAMINE: Fructosamine: 403 umol/L — ABNORMAL HIGH (ref 0–285)

## 2020-03-15 IMAGING — DX DG CHEST 1V PORT
1 series · 1 of 1 positions shown · non-contrast
Comparison: None.

CLINICAL DATA: Cough and fever and shortness of breath.

EXAM:
PORTABLE CHEST 1 VIEW

[chest ap]
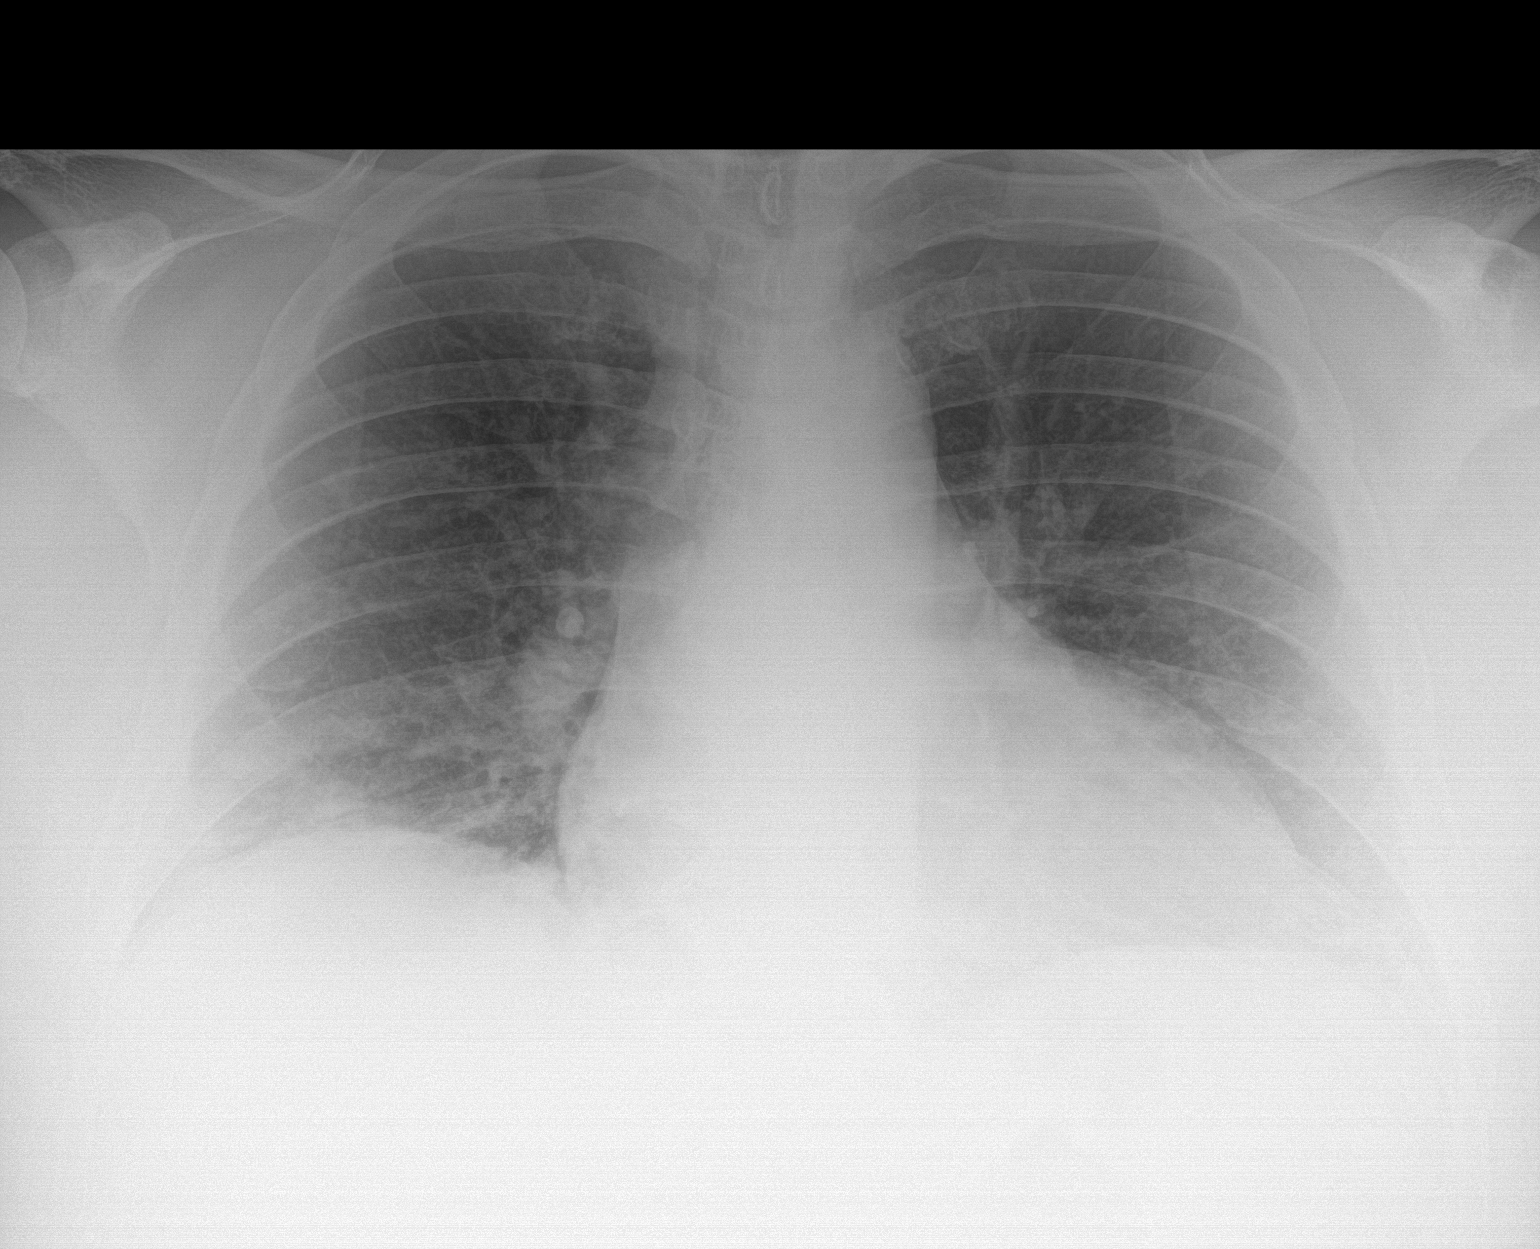

[1 of 1 positions shown; findings below may reference images not displayed]

FINDINGS: The heart size and mediastinal contours are within normal limits.
Both lungs are clear except for minimal atelectasis at the right
lung base. The visualized skeletal structures are unremarkable.
IMPRESSION: Minimal atelectasis at the right lung base. Otherwise, normal exam.

## 2020-03-17 ENCOUNTER — Other Ambulatory Visit: Payer: Self-pay | Admitting: Endocrinology

## 2020-04-11 ENCOUNTER — Other Ambulatory Visit: Payer: Self-pay | Admitting: Endocrinology

## 2020-06-18 ENCOUNTER — Other Ambulatory Visit: Payer: Self-pay | Admitting: Endocrinology

## 2020-06-28 ENCOUNTER — Other Ambulatory Visit: Payer: Self-pay

## 2020-06-28 ENCOUNTER — Ambulatory Visit: Payer: 59 | Admitting: Endocrinology

## 2020-06-28 ENCOUNTER — Encounter: Payer: Self-pay | Admitting: Endocrinology

## 2020-06-28 VITALS — BP 136/85 | HR 93 | Ht 73.0 in | Wt 331.8 lb

## 2020-06-28 DIAGNOSIS — Z23 Encounter for immunization: Secondary | ICD-10-CM

## 2020-06-28 DIAGNOSIS — E7800 Pure hypercholesterolemia, unspecified: Secondary | ICD-10-CM | POA: Diagnosis not present

## 2020-06-28 DIAGNOSIS — I10 Essential (primary) hypertension: Secondary | ICD-10-CM

## 2020-06-28 DIAGNOSIS — E1165 Type 2 diabetes mellitus with hyperglycemia: Secondary | ICD-10-CM | POA: Diagnosis not present

## 2020-06-28 MED ORDER — XIGDUO XR 5-1000 MG PO TB24: 2.0000 | ORAL_TABLET | Freq: Every day | ORAL | 1 refills | Status: DC

## 2020-06-28 MED ORDER — OZEMPIC (0.25 OR 0.5 MG/DOSE) 2 MG/1.5ML ~~LOC~~ SOPN: 0.5000 mg | PEN_INJECTOR | SUBCUTANEOUS | 2 refills | Status: DC

## 2020-06-28 NOTE — Patient Instructions (Signed)
Start OZEMPIC injections by dialing 0.25 mg on the pen as shown once weekly on the same day of the week.   You may inject in the sides of the stomach, outer thigh or arm as indicated in the brochure given. If you have any difficulties using the pen see the video at CompPlans.[email or web address]  You will feel fullness of the stomach with starting the medication and should try to keep the portions at meals small.  You may experience nausea in the first few days which usually gets better over time    After 2 weeks increase the dose to 0.5 mg weekly  If you have any questions or persistent side effects please call the office   You may also talk to a nurse educator with [employer] at [phone] Useful website: [email or web address]

## 2020-06-28 NOTE — Progress Notes (Signed)
Patient ID: Gregory Fitzgerald, male   DOB: [birth year], 54 y.o.   MRN: [id number]     Reason for Appointment: Endocrinology follow-up  History of Present Illness:          Diagnosis: Type 2 diabetes mellitus, date of diagnosis: 2006        Past history: At diagnosis he was not having any symptoms and glucose was discovered incidentally when he was found to be hypertensive He had been on metformin for several years and at some point was also taking Amaryl His blood sugars had been significantly high since about 2013 with A1c around 10% On his initial consultation he was taking 1.8 mg Victoza along with metformin ER 1500 mg daily Baseline A1c was 9.7% Because of his obesity he was started on Invokana 100 mg for better control; Amaryl 2 mg was added because of fasting hyperglycemia A1c had come down to 6.6 in October 2015  Recent history:   Non-insulin hypoglycemic drugs prescribed: Synjardy XR 12.11/998 mg , Victoza 1.8 mg daily  His blood sugars have been excellent previously and usually consistent  A1c is last 8.2 compared to 6.1 last year Fructosamine in August was 403   Current blood sugar patterns and management:  He did not come back for his scheduled appointment in August when he had been out of his medication  However even with refilling his medications his fructosamine indicated poor control  He is very regular with his monitoring and he has only sporadic readings in August and September  These were ranging from 141-195 FASTING  Nonfasting readings ranging from 114-241  He ran out of his medications about a week ago and since then his blood sugars have been in the low 200 range  Again he does not take his medications regularly or will refill them when needed  He generally appears to be regular with taking his Victoza for various reasons and he thinks he can do better with a weekly shot  He is also concerned about the cost of Synjardy  Diet has been  variable but his weight is about the same  He says he has not been able to exercise because of not being able to go to the gym  Side effects from medications have been: None  GLUCOSE MONITORING:  Blood sugars as above with freestyle monitor   Self-care:  .  His meals are usually  meat and vegetables. He will have low carbohydrate snacks          Dietician visit: Most recent: 2008 .   Wt Readings from Last 3 Encounters:  06/28/20 (!) 331 lb 12.8 oz (150.5 kg)  01/27/20 (!) 333 lb (151 kg)  05/20/19 (!) 323 lb (146.5 kg)      Lab Results  Component Value Date   HGBA1C 8.2 (A) 01/28/2020   HGBA1C 6.1 05/16/2019   HGBA1C 6.8 (H) 02/24/2019   Lab Results  Component Value Date   MICROALBUR 61.1 (H) 07/29/2018   LDLCALC 92 03/08/2020   CREATININE 0.92 09/08/2019       OTHER active problems are discussed in review of systems               Allergies as of 06/28/2020   No Known Allergies     Medication List       Accurate as of June 28, 2020  3:06 PM. If you have any questions, ask your nurse or doctor.        STOP taking these  medications   rosuvastatin 10 MG tablet Commonly known as: CRESTOR Stopped by: Elayne Snare, MD   Synjardy 12.11-998 MG Tabs Generic drug: Empagliflozin-metFORMIN HCl Stopped by: Elayne Snare, MD   Victoza 18 MG/3ML Sopn Generic drug: liraglutide Stopped by: Elayne Snare, MD     TAKE these medications   aspirin 81 MG tablet Take 81 mg by mouth daily.   carbamazepine 200 MG tablet Commonly known as: TEGRETOL TAKE 1 TABLET BY MOUTH TWICE A DAY   ezetimibe 10 MG tablet Commonly known as: Zetia Take 1 tablet (10 mg total) by mouth daily.   fluvastatin XL 80 MG 24 hr tablet Commonly known as: Lescol XL Take 1 tablet (80 mg total) by mouth daily.   FREESTYLE LITE test strip Generic drug: glucose blood Use as instructed bid   Insulin Pen Needle 32G X 6 MM Misc Commonly known as: NovoFine Use 1 per day to inject Victoza    Insulin Pen Needle 32G X 6 MM Misc Commonly known as: NovoFine USE 1 PER DAY TO INJECT VICTOZA   BD Pen Needle Nano U/F 32G X 4 MM Misc Generic drug: Insulin Pen Needle USE 1 PER DAY TO INJECT VICTOZA   lisinopril 20 MG tablet Commonly known as: ZESTRIL TAKE 1 TABLET BY MOUTH EVERY DAY   Ozempic (0.25 or 0.5 MG/DOSE) 2 MG/1.5ML Sopn Generic drug: Semaglutide(0.25 or 0.5MG /DOS) Inject 0.5 mg into the skin once a week. Use as directed Started by: Elayne Snare, MD   tadalafil 20 MG tablet Commonly known as: Cialis TAKE 1 TABLET EVERY DAY AS NEEDED FOR ERECTILE DYSFUNCTION   Xigduo XR 11-998 MG Tb24 Generic drug: Dapagliflozin-metFORMIN HCl ER Take 2 tablets by mouth daily. Started by: Elayne Snare, MD       Allergies: No Known Allergies  Past Medical History:  Diagnosis Date  . Arthritis    knees  . Diabetes ([place])   . GERD (gastroesophageal reflux disease)    years ago, not currently   . Hyperlipidemia   . Hypertension   . Trigeminal neuralgia     Past Surgical History:  Procedure Laterality Date  . 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 KNEE SURGERY     x 2 in high school     Family History  Problem Relation Age of Onset  . Diabetes Mother   . Diabetes Father   . Lymphoma Father   . Colon cancer Neg Hx   . Colon polyps Neg Hx   . Esophageal cancer Neg Hx   . Rectal cancer Neg Hx   . Stomach cancer Neg Hx     Social History:  reports that he has never smoked. He has never used smokeless tobacco. He reports current alcohol use. He reports that he does not use drugs.    Review of Systems        Lipids: treated with Lipitor 20 mg previously but this was stopped empirically because of leg stiffness;   He is taking Lescol with his Zetia now, may have had some side effects of leg stiffness from Crestor before  LDL was not controlled on pravastatin previously      Lab Results  Component Value Date   CHOL 166 03/08/2020   CHOL 201 (H) 09/08/2019   CHOL  173 05/16/2019   Lab Results  Component Value Date   HDL 49.80 03/08/2020   HDL 52.00 09/08/2019   HDL 54.00 05/16/2019   Lab Results  Component Value Date  [place] 92 03/08/2020   LDLCALC 115 (H) 09/08/2019   LDLCALC 100 (H) 05/16/2019   Lab Results  Component Value Date   TRIG 123.0 03/08/2020   TRIG 171.0 (H) 09/08/2019   TRIG 97.0 05/16/2019   Lab Results  Component Value Date   CHOLHDL 3 03/08/2020   CHOLHDL 4 09/08/2019   CHOLHDL 3 05/16/2019   No results found for: LDLDIRECT      The blood pressure has been usually been under control with 20 mg lisinopril He thinks he is taking this regularly Has previous history of microalbuminuria    BP Readings from Last 3 Encounters:  06/28/20 136/85  01/27/20 126/86  07/05/19 (!) 614/43      Complications: Erectile dysfunction. Treated with Cialis 20mg  as needed    Diabetic foot exam in 12/21 showed normal monofilament sensation, Decreased pulses in feet, calluses Rt distal foot He has been followed for his calluses elsewhere  Physical Examination:  BP 136/85   Pulse 93   Ht 6\' 1"  (1.854 m)   Wt (!) 331 lb 12.8 oz (150.5 kg)   SpO2 95%   BMI 43.78 kg/m     Diabetic Foot Exam - Simple   Simple Foot Form Diabetic Foot exam was performed with the following findings: Yes   Visual Inspection No deformities, no ulcerations, no other skin breakdown bilaterally: Yes See comments: Yes Sensation Testing Intact to touch and monofilament testing bilaterally: Yes Pulse Check Posterior Tibialis and Dorsalis pulse intact bilaterally: Yes See comments: Yes Comments Decreased posterior tibialis pulses Calluses on left foot laterally around midfoot       ASSESSMENT:  Diabetes type 2, with BMI 44, non-insulin-dependent  See history of present illness for discussion of current diabetes management, blood sugar patterns and problems identified  A1c is last 8.2  He appears to be still very inconsistent with  his medications Not clear if this is related to cost issues or whether he is forgetting to take the Victoza on a daily basis Blood sugar monitoring is inconsistent Also has not exercised which has helped before Likely does better on the diet when he is on Victoza regularly also  Although he has not gained any further weight he is still about what his best levels have been .  Not pursue bariatric surgery for his morbid obesity as instructed No recent labs available for assessment but fructosamine was still high when he followed up in August   HYPERTENSION: Not well controlled, may be partly from lack of exercise as also not taking his Synjardy recently Needs follow-up for microalbumin also  LIPIDS: LDL is last improved with taking Lescol and Zetia together regularly Will recheck on the next visit again   PLAN:   Discussed need for better control and consistency with medications  He is needing to monitor his blood sugar regularly including after dinner  Instead of Victoza he will try Ozempic weekly which will be easier to comply with, discussed in detail how this works, timing, how to use the device, titration of the dose and possible side effects  He can move up to the 0.5 mg dose on the third week if no nausea  Also may have better coverage for Xigduo compared to [place] and will switch with the co-pay card  Discussed blood sugar targets  He will try to watch his diet better as instructed  More regular follow-up  Influenza vaccine given   Patient Instructions  Start OZEMPIC injections by dialing 0.25 mg on the pen  as shown once weekly on the same day of the week.   You may inject in the sides of the stomach, outer thigh or arm as indicated in the brochure given. If you have any difficulties using the pen see the video at CompPlans.[email or web address]  You will feel fullness of the stomach with starting the medication and should try to keep the portions at meals small.  You may  experience nausea in the first few days which usually gets better over time    After 2 weeks increase the dose to 0.5 mg weekly  If you have any questions or persistent side effects please call the office   You may also talk to a nurse educator with [employer] at [phone] Useful website: [email or web address]        Elayne Snare 06/28/2020, 3:06 PM   Note: This office note was prepared with Dragon voice recognition system technology. Any transcriptional errors that result from this process are unintentional.

## 2020-07-06 ENCOUNTER — Other Ambulatory Visit: Payer: Self-pay | Admitting: Endocrinology

## 2020-08-05 ENCOUNTER — Other Ambulatory Visit: Payer: 59

## 2020-08-05 DIAGNOSIS — Z20822 Contact with and (suspected) exposure to covid-19: Secondary | ICD-10-CM

## 2020-08-06 ENCOUNTER — Other Ambulatory Visit (INDEPENDENT_AMBULATORY_CARE_PROVIDER_SITE_OTHER): Payer: 59

## 2020-08-06 DIAGNOSIS — E7800 Pure hypercholesterolemia, unspecified: Secondary | ICD-10-CM | POA: Diagnosis not present

## 2020-08-06 DIAGNOSIS — E1165 Type 2 diabetes mellitus with hyperglycemia: Secondary | ICD-10-CM

## 2020-08-06 LAB — LIPID PANEL
Cholesterol: 187 mg/dL (ref 0–200)
HDL: 47.3 mg/dL (ref >39.00)
LDL Cholesterol: 114 mg/dL — ABNORMAL HIGH (ref 0–99)
NonHDL: 139.35
Total CHOL/HDL Ratio: 4
Triglycerides: 129 mg/dL (ref 0.0–149.0)
VLDL: 25.8 mg/dL (ref 0.0–40.0)

## 2020-08-06 LAB — MICROALBUMIN / CREATININE URINE RATIO
Creatinine,U: 92.1 mg/dL
Microalb Creat Ratio: 81.8 mg/g — ABNORMAL HIGH (ref 0.0–30.0)
Microalb, Ur: 75.4 mg/dL — ABNORMAL HIGH (ref 0.0–1.9)

## 2020-08-06 LAB — COMPREHENSIVE METABOLIC PANEL
ALT: 10 U/L (ref 0–53)
AST: 14 U/L (ref 0–37)
Albumin: 4.2 g/dL (ref 3.5–5.2)
Alkaline Phosphatase: 58 U/L (ref 39–117)
BUN: 16 mg/dL (ref 6–23)
CO2: 25 mEq/L (ref 19–32)
Calcium: 9.3 mg/dL (ref 8.4–10.5)
Chloride: 104 mEq/L (ref 96–112)
Creatinine, Ser: 1 mg/dL (ref 0.40–1.50)
GFR: 86.58 mL/min (ref >60.00)
Glucose, Bld: 167 mg/dL — ABNORMAL HIGH (ref 70–99)
Potassium: 3.9 mEq/L (ref 3.5–5.1)
Sodium: 138 mEq/L (ref 135–145)
Total Bilirubin: 0.8 mg/dL (ref 0.2–1.2)
Total Protein: 7.2 g/dL (ref 6.0–8.3)

## 2020-08-06 LAB — HEMOGLOBIN A1C: Hgb A1c MFr Bld: 8 % — ABNORMAL HIGH (ref 4.6–6.5)

## 2020-08-07 LAB — NOVEL CORONAVIRUS, NAA: SARS-CoV-2, NAA: NOT DETECTED

## 2020-08-07 LAB — SARS-COV-2, NAA 2 DAY TAT

## 2020-08-11 NOTE — Progress Notes (Signed)
Patient ID: Gregory Fitzgerald, male   DOB: 24-Feb-1966, 55 y.o.   MRN: [id number]     Reason for Appointment: Endocrinology follow-up  History of Present Illness:          Diagnosis: Type 2 diabetes mellitus, date of diagnosis: 2006        Past history: At diagnosis he was not having any symptoms and glucose was discovered incidentally when he was found to be hypertensive He had been on metformin for several years and at some point was also taking Amaryl His blood sugars had been significantly high since about 2013 with A1c around 10% On his initial consultation he was taking 1.8 mg Victoza along with metformin ER 1500 mg daily Baseline A1c was 9.7% Because of his obesity he was started on Invokana 100 mg for better control; Amaryl 2 mg was added because of fasting hyperglycemia A1c had come down to 6.6 in October 2015  Recent history:   Non-insulin hypoglycemic drugs prescribed: Xigduo 11/998 mg , 2 tablets daily, Ozempic 0.5 mg weekly  A1c is 8%, was 8.2 in 7/21 Has been as low as 6.1   Current blood sugar patterns and management:  Since he was occasionally forgetting his Victoza and he was preferring a weekly injection he is now on Ozempic  He has moved up to the 0.5 mg dose without any side effects  His blood sugar monitoring is about once a day but blood sugars are much lower than expected for his A1c and lab glucose  Recent blood sugar range 78-104 with some morning and some evening readings before meals  However he is using EXPIRED test strips from 2019; lab glucose was 167 late afternoon  He says he thinks he can do better with diet and cutting back on carbohydrate  Has not had any significant weight loss  Usually does better with his control when he is able to lose significant amount of weight  Also because of issues with the pandemic he has not gone to the gym lately  He was switched from [place] to [place] because of higher co-pay with [employer]  He is  taking this regularly   Side effects from medications have been: None  GLUCOSE MONITORING:  Blood sugars as above with freestyle monitor   Self-care:  .  His meals are usually  meat and vegetables. He will have low carbohydrate snacks          Dietician visit: Most recent: 2008 .   Wt Readings from Last 3 Encounters:  08/12/20 (!) 330 lb 3.2 oz (149.8 kg)  06/28/20 (!) 331 lb 12.8 oz (150.5 kg)  01/27/20 (!) 333 lb (151 kg)      Lab Results  Component Value Date   HGBA1C 8.0 (H) 08/06/2020   HGBA1C 8.2 (A) 01/28/2020   HGBA1C 6.1 05/16/2019   Lab Results  Component Value Date   MICROALBUR 75.4 (H) 08/06/2020   LDLCALC 114 (H) 08/06/2020   CREATININE 1.00 08/06/2020       OTHER active problems are discussed in review of systems               Allergies as of 08/12/2020   No Known Allergies     Medication List       Accurate as of August 12, 2020  4:42 PM. If you have any questions, ask your nurse or doctor.        aspirin 81 MG tablet Take 81 mg by mouth daily.  carbamazepine 200 MG tablet Commonly known as: TEGRETOL TAKE 1 TABLET BY MOUTH TWICE A DAY   ezetimibe 10 MG tablet Commonly known as: Zetia Take 1 tablet (10 mg total) by mouth daily.   fluvastatin XL 80 MG 24 hr tablet Commonly known as: Lescol XL Take 1 tablet (80 mg total) by mouth daily.   FREESTYLE LITE test strip Generic drug: glucose blood Use as instructed bid   Insulin Pen Needle 32G X 6 MM Misc Commonly known as: NovoFine Use 1 per day to inject Victoza   Insulin Pen Needle 32G X 6 MM Misc Commonly known as: NovoFine USE 1 PER DAY TO INJECT VICTOZA   BD Pen Needle Nano U/F 32G X 4 MM Misc Generic drug: Insulin Pen Needle USE 1 PER DAY TO INJECT VICTOZA   lisinopril 20 MG tablet Commonly known as: ZESTRIL TAKE 1 TABLET BY MOUTH EVERY DAY   Ozempic (0.25 or 0.5 MG/DOSE) 2 MG/1.5ML Sopn Generic drug: Semaglutide(0.25 or 0.5MG /DOS) Inject 0.5 mg into the skin once a  week. Use as directed   tadalafil 20 MG tablet Commonly known as: Cialis TAKE 1 TABLET EVERY DAY AS NEEDED FOR ERECTILE DYSFUNCTION   Xigduo XR 11-998 MG Tb24 Generic drug: Dapagliflozin-metFORMIN HCl ER Take 2 tablets by mouth daily.       Allergies: No Known Allergies  Past Medical History:  Diagnosis Date  . Arthritis    knees  . Diabetes ([place])   . GERD (gastroesophageal reflux disease)    years ago, not currently   . Hyperlipidemia   . Hypertension   . Trigeminal neuralgia     Past Surgical History:  Procedure Laterality Date  . club foot surgery     age 18 months  . KNEE SURGERY     x 2 in high school     Family History  Problem Relation Age of Onset  . Diabetes Mother   . Diabetes Father   . Lymphoma Father   . Colon cancer Neg Hx   . Colon polyps Neg Hx   . Esophageal cancer Neg Hx   . Rectal cancer Neg Hx   . Stomach cancer Neg Hx     Social History:  reports that he has never smoked. He has never used smokeless tobacco. He reports current alcohol use. He reports that he does not use drugs.    Review of Systems        Lipids: treated with Lipitor 20 mg previously but this was stopped empirically because of leg stiffness;   He is taking Lescol, may have had some side effects of leg stiffness from Crestor before  LDL was improved in 8/21 but likely has run out of refills of Zetia prescribed about a year ago and LDL is higher again      Lab Results  Component Value Date   CHOL 187 08/06/2020   CHOL 166 03/08/2020   CHOL 201 (H) 09/08/2019   Lab Results  Component Value Date   HDL 47.30 08/06/2020   HDL 49.80 03/08/2020   HDL 52.00 09/08/2019   Lab Results  Component Value Date   LDLCALC 114 (H) 08/06/2020   LDLCALC 92 03/08/2020   LDLCALC 115 (H) 09/08/2019   Lab Results  Component Value Date   TRIG 129.0 08/06/2020   TRIG 123.0 03/08/2020   TRIG 171.0 (H) 09/08/2019   Lab Results  Component Value Date   CHOLHDL 4 08/06/2020    CHOLHDL 3 03/08/2020   CHOLHDL 4 09/08/2019  No results found for: LDLDIRECT   HYPERTENSION: Treated with 20 mg lisinopril  Also has microalbuminuria    BP Readings from Last 3 Encounters:  08/12/20 132/84  06/28/20 136/85  01/27/20 010/93      Complications: Erectile dysfunction. Treated with Cialis 20mg  as needed    Diabetic foot exam in 12/21 showed normal monofilament sensation, Decreased pulses in feet, calluses Rt distal foot He has been followed for his calluses elsewhere  Physical Examination:  BP 132/84   Pulse 94   Ht 6\' 1"  (1.854 m)   Wt (!) 330 lb 3.2 oz (149.8 kg)   SpO2 96%   BMI 43.56 kg/m   No ankle edema present   ASSESSMENT:  Diabetes type 2, with BMI 44, non-insulin-dependent  See history of present illness for discussion of current diabetes management, blood sugar patterns and problems identified  A1c is still high at 8%  With Xigduo and Ozempic his blood sugars recently are still not controlled and lab glucose recently 167 nonfasting He does do better when he makes more efforts to lose weight Especially recently has not exercised which usually helps his control   HYPERTENSION: Diastolic reading mildly increased Also has microalbuminuria  LIPIDS: LDL is still relatively higher at 114, has been prescribed Zetia by PCP but apparently has not had any refills lately   PLAN:   Ozempic 0.5 mg will be increased to 2 injections weekly and when finished he will go to the 1 mg pen  New test trips will be prescribed since he is using expired test strips  Check readings after meals more consistently  If his [place] blood sugars are still relatively high may consider Amaryl, otherwise may also consider Prandin  Increase exercise and he can consider getting a treadmill for home use  Restart Zetia, new prescription will be sent  Consider increasing lisinopril to 40 mg for control of blood pressure and high microalbumin, to recheck on the next  visit   There are no Patient Instructions on file for this visit.  Elayne Snare 08/12/2020, 4:42 PM   Note: This office note was prepared with Dragon voice recognition system technology. Any transcriptional errors that result from this process are unintentional.

## 2020-08-12 ENCOUNTER — Encounter: Payer: Self-pay | Admitting: Endocrinology

## 2020-08-12 ENCOUNTER — Other Ambulatory Visit: Payer: Self-pay

## 2020-08-12 ENCOUNTER — Ambulatory Visit: Payer: 59 | Admitting: Endocrinology

## 2020-08-12 VITALS — BP 132/84 | HR 94 | Ht 73.0 in | Wt 330.2 lb

## 2020-08-12 DIAGNOSIS — E1129 Type 2 diabetes mellitus with other diabetic kidney complication: Secondary | ICD-10-CM

## 2020-08-12 DIAGNOSIS — R809 Proteinuria, unspecified: Secondary | ICD-10-CM | POA: Diagnosis not present

## 2020-08-12 DIAGNOSIS — E7800 Pure hypercholesterolemia, unspecified: Secondary | ICD-10-CM | POA: Diagnosis not present

## 2020-08-12 DIAGNOSIS — E1165 Type 2 diabetes mellitus with hyperglycemia: Secondary | ICD-10-CM | POA: Diagnosis not present

## 2020-08-12 MED ORDER — EZETIMIBE 10 MG PO TABS: 10.0000 mg | ORAL_TABLET | Freq: Every day | ORAL | 1 refills | Status: DC

## 2020-08-12 MED ORDER — OZEMPIC (1 MG/DOSE) 2 MG/1.5ML ~~LOC~~ SOPN: 1.0000 mg | PEN_INJECTOR | SUBCUTANEOUS | 1 refills | Status: DC

## 2020-08-12 MED ORDER — FREESTYLE LITE TEST VI STRP
ORAL_STRIP | 3 refills | Status: DC
Start: 2020-08-12 — End: 2021-03-14

## 2020-08-12 NOTE — Patient Instructions (Addendum)
Ozempic 2 shots of 0.5mg  weekly then go to 1mg  weekly  Check blood sugars on waking up 2-3 days a week  Also check blood sugars about 2 hours after meals and do this after different meals by rotation  Recommended blood sugar levels on waking up are 90-130 and about 2 hours after meal is 130-160  Please bring your blood sugar monitor to each visit, thank you  Watch diet

## 2020-08-27 ENCOUNTER — Other Ambulatory Visit: Payer: Self-pay | Admitting: Endocrinology

## 2020-09-21 ENCOUNTER — Other Ambulatory Visit: Payer: Self-pay | Admitting: Endocrinology

## 2020-10-21 ENCOUNTER — Other Ambulatory Visit: Payer: Self-pay | Admitting: Endocrinology

## 2020-11-10 ENCOUNTER — Ambulatory Visit: Payer: 59 | Admitting: Endocrinology

## 2020-11-11 ENCOUNTER — Ambulatory Visit: Payer: 59 | Admitting: Endocrinology

## 2020-11-28 ENCOUNTER — Other Ambulatory Visit: Payer: Self-pay | Admitting: Endocrinology

## 2020-11-30 ENCOUNTER — Other Ambulatory Visit (INDEPENDENT_AMBULATORY_CARE_PROVIDER_SITE_OTHER): Payer: 59

## 2020-11-30 DIAGNOSIS — E1129 Type 2 diabetes mellitus with other diabetic kidney complication: Secondary | ICD-10-CM

## 2020-11-30 DIAGNOSIS — E1165 Type 2 diabetes mellitus with hyperglycemia: Secondary | ICD-10-CM

## 2020-11-30 DIAGNOSIS — E7800 Pure hypercholesterolemia, unspecified: Secondary | ICD-10-CM | POA: Diagnosis not present

## 2020-11-30 DIAGNOSIS — R809 Proteinuria, unspecified: Secondary | ICD-10-CM

## 2020-11-30 LAB — COMPREHENSIVE METABOLIC PANEL
ALT: 11 U/L (ref 0–53)
AST: 16 U/L (ref 0–37)
Albumin: 4.3 g/dL (ref 3.5–5.2)
Alkaline Phosphatase: 61 U/L (ref 39–117)
BUN: 23 mg/dL (ref 6–23)
CO2: 24 mEq/L (ref 19–32)
Calcium: 10.3 mg/dL (ref 8.4–10.5)
Chloride: 103 mEq/L (ref 96–112)
Creatinine, Ser: 1.09 mg/dL (ref 0.40–1.50)
GFR: 77.9 mL/min (ref >60.00)
Glucose, Bld: 106 mg/dL — ABNORMAL HIGH (ref 70–99)
Potassium: 4.3 mEq/L (ref 3.5–5.1)
Sodium: 137 mEq/L (ref 135–145)
Total Bilirubin: 0.9 mg/dL (ref 0.2–1.2)
Total Protein: 7.5 g/dL (ref 6.0–8.3)

## 2020-11-30 LAB — LDL CHOLESTEROL, DIRECT: Direct LDL: 107 mg/dL

## 2020-11-30 LAB — MICROALBUMIN / CREATININE URINE RATIO
Creatinine,U: 106 mg/dL
Microalb Creat Ratio: 79.2 mg/g — ABNORMAL HIGH (ref 0.0–30.0)
Microalb, Ur: 83.9 mg/dL — ABNORMAL HIGH (ref 0.0–1.9)

## 2020-11-30 LAB — HEMOGLOBIN A1C: Hgb A1c MFr Bld: 5.2 % (ref 4.6–6.5)

## 2020-12-10 ENCOUNTER — Ambulatory Visit: Payer: 59 | Admitting: Endocrinology

## 2020-12-15 ENCOUNTER — Encounter: Payer: Self-pay | Admitting: Endocrinology

## 2020-12-15 ENCOUNTER — Other Ambulatory Visit: Payer: Self-pay

## 2020-12-15 ENCOUNTER — Ambulatory Visit: Payer: 59 | Admitting: Endocrinology

## 2020-12-15 VITALS — BP 120/82 | HR 76 | Ht 73.0 in | Wt 301.8 lb

## 2020-12-15 DIAGNOSIS — E7800 Pure hypercholesterolemia, unspecified: Secondary | ICD-10-CM | POA: Diagnosis not present

## 2020-12-15 DIAGNOSIS — E669 Obesity, unspecified: Secondary | ICD-10-CM

## 2020-12-15 DIAGNOSIS — I10 Essential (primary) hypertension: Secondary | ICD-10-CM | POA: Diagnosis not present

## 2020-12-15 DIAGNOSIS — E1169 Type 2 diabetes mellitus with other specified complication: Secondary | ICD-10-CM

## 2020-12-15 DIAGNOSIS — R809 Proteinuria, unspecified: Secondary | ICD-10-CM

## 2020-12-15 DIAGNOSIS — E1129 Type 2 diabetes mellitus with other diabetic kidney complication: Secondary | ICD-10-CM | POA: Diagnosis not present

## 2020-12-15 MED ORDER — FLUVASTATIN SODIUM ER 80 MG PO TB24: 80.0000 mg | ORAL_TABLET | Freq: Every day | ORAL | 5 refills | Status: DC

## 2020-12-15 NOTE — Progress Notes (Signed)
Patient ID: Gregory Fitzgerald, male   DOB: 1965-09-16, 55 y.o.   MRN: [id number]     Reason for Appointment: Endocrinology follow-up  History of Present Illness:          Diagnosis: Type 2 diabetes mellitus, date of diagnosis: 2006        Past history: At diagnosis he was not having any symptoms and glucose was discovered incidentally when he was found to be hypertensive He had been on metformin for several years and at some point was also taking Amaryl His blood sugars had been significantly high since about 2013 with A1c around 10% On his initial consultation he was taking 1.8 mg Victoza along with metformin ER 1500 mg daily Baseline A1c was 9.7% Because of his obesity he was started on Invokana 100 mg for better control; Amaryl 2 mg was added because of fasting hyperglycemia A1c had come down to 6.6 in October 2015  Recent history:   Non-insulin hypoglycemic drugs prescribed: Xigduo 11/998 mg , 2 tablets daily, Ozempic 1 mg weekly  A1c is 5.2 compared to 8% He was last seen in 1/22   Current blood sugar patterns and management:  Since he has started going 5 days a week to the gym with about an hour of both aerobic and resistance exercises he has lost about 30 pounds  Blood sugars are considerably better although previously was using expired test strips  His highest reading recently is only 154  Overall blood sugars averaging only 95  He thinks he is also trying to moderate his portions and cutting back on carbohydrate  Also may be benefiting from going up to 1 mg on his Ozempic in January  He is taking his Xigduo regularly and no change in renal function  Lab glucose 106 fasting  He works 3rd shift Dinner usually around 4 pm which is his main meal  Side effects from medications have been: None  GLUCOSE MONITORING:  Blood sugars from download   PRE-MEAL  overnight  midday  afternoon  evening Overall  Glucose range:     77-154  Mean/median:  96  103  87   94  95    Self-care:  .  His meals are usually  meat and vegetables. He will have low carbohydrate snacks          Dietician visit: Most recent: 2008 .   Wt Readings from Last 3 Encounters:  12/15/20 (!) 301 lb 12.8 oz (136.9 kg)  08/12/20 (!) 330 lb 3.2 oz (149.8 kg)  06/28/20 (!) 331 lb 12.8 oz (150.5 kg)      Lab Results  Component Value Date   HGBA1C 5.2 11/30/2020   HGBA1C 8.0 (H) 08/06/2020   HGBA1C 8.2 (A) 01/28/2020   Lab Results  Component Value Date   MICROALBUR 83.9 (H) 11/30/2020   LDLCALC 114 (H) 08/06/2020   CREATININE 1.09 11/30/2020       OTHER active problems are discussed in review of systems               Allergies as of 12/15/2020   No Known Allergies     Medication List       Accurate as of Dec 15, 2020  9:07 PM. If you have any questions, ask your nurse or doctor.        aspirin 81 MG tablet Take 81 mg by mouth daily.   ezetimibe 10 MG tablet Commonly known as: Zetia Take 1 tablet (10 mg total)  by mouth daily.   fluvastatin XL 80 MG 24 hr tablet Commonly known as: Lescol XL Take 1 tablet (80 mg total) by mouth daily.   FREESTYLE LITE test strip Generic drug: glucose blood Use as instructed bid   Insulin Pen Needle 32G X 6 MM Misc Commonly known as: NovoFine Use 1 per day to inject Victoza   Insulin Pen Needle 32G X 6 MM Misc Commonly known as: NovoFine USE 1 PER DAY TO INJECT VICTOZA   BD Pen Needle Nano U/F 32G X 4 MM Misc Generic drug: Insulin Pen Needle USE 1 PER DAY TO INJECT VICTOZA   lisinopril 20 MG tablet Commonly known as: ZESTRIL TAKE 1 TABLET BY MOUTH EVERY DAY   Ozempic (1 MG/DOSE) 4 MG/3ML Sopn Generic drug: Semaglutide (1 MG/DOSE) INJECT 1 MG INTO THE SKIN ONCE A WEEK.   tadalafil 20 MG tablet Commonly known as: Cialis TAKE 1 TABLET EVERY DAY AS NEEDED FOR ERECTILE DYSFUNCTION   Xigduo XR 11-998 MG Tb24 Generic drug: Dapagliflozin-metFORMIN HCl ER TAKE 2 TABLETS BY MOUTH EVERY DAY        Allergies: No Known Allergies  Past Medical History:  Diagnosis Date  . Arthritis    knees  . Diabetes ([place])   . GERD (gastroesophageal reflux disease)    years ago, not currently   . Hyperlipidemia   . Hypertension   . Trigeminal neuralgia     Past Surgical History:  Procedure Laterality Date  . club foot surgery     age 15 months  . KNEE SURGERY     x 2 in high school     Family History  Problem Relation Age of Onset  . Diabetes Mother   . Diabetes Father   . Lymphoma Father   . Colon cancer Neg Hx   . Colon polyps Neg Hx   . Esophageal cancer Neg Hx   . Rectal cancer Neg Hx   . Stomach cancer Neg Hx     Social History:  reports that he has never smoked. He has never used smokeless tobacco. He reports current alcohol use. He reports that he does not use drugs.    Review of Systems        Lipids: treated with Lipitor 20 mg previously but this was stopped empirically because of leg stiffness;   Not clear if he is taking his Lescol, may have had some side effects of leg stiffness from Crestor before Zetia was prescribed on his last visit but he may have stopped his Lescol by mistake and LDL is still over 100      Lab Results  Component Value Date   CHOL 187 08/06/2020   CHOL 166 03/08/2020   CHOL 201 (H) 09/08/2019   Lab Results  Component Value Date   HDL 47.30 08/06/2020   HDL 49.80 03/08/2020   HDL 52.00 09/08/2019   Lab Results  Component Value Date   LDLCALC 114 (H) 08/06/2020   LDLCALC 92 03/08/2020   LDLCALC 115 (H) 09/08/2019   Lab Results  Component Value Date   TRIG 129.0 08/06/2020   TRIG 123.0 03/08/2020   TRIG 171.0 (H) 09/08/2019   Lab Results  Component Value Date   CHOLHDL 4 08/06/2020   CHOLHDL 3 03/08/2020   CHOLHDL 4 09/08/2019   Lab Results  Component Value Date   LDLDIRECT 107.0 11/30/2020     HYPERTENSION: Treated with 20 mg lisinopril  Also has microalbuminuria    BP Readings from Last 3 Encounters:  12/15/20 120/82  08/12/20 132/84  06/28/20 741/28      Complications: Erectile dysfunction. Treated with Cialis 20mg  as needed    Diabetic foot exam in 12/21 showed normal monofilament sensation, Decreased pulses in feet, calluses Rt distal foot He has been followed for his calluses elsewhere  Physical Examination:  BP 120/82 (Cuff Size: Large)   Pulse 76   Ht 6\' 1"  (1.854 m)   Wt (!) 301 lb 12.8 oz (136.9 kg)   SpO2 95%   BMI 39.82 kg/m   No ankle edema present Inspection of feet on the plantar surfaces looks normal   ASSESSMENT:  Diabetes type 2, non-insulin-dependent  See history of present illness for discussion of current diabetes management, blood sugar patterns and problems identified  A1c is markedly improved at 5.2  With Xigduo and now 1.0 mg Ozempic his blood sugars since his last visit have progressively improved He has considerably benefited from starting an exercise program at the gym Also likely has done better with his diet and is more motivated Blood sugars are fairly close to normal at home although difficult to know which readings are postprandial since he is working night shifts   HYPERTENSION: On lisinopril 20 mg Diastolic reading high normal and will continue to follow Needs periodic microalbumin monitoring, consider increasing lisinopril to 40 mg  LIPIDS: LDL is still above 100 and needs to get back on his Lescol since he is only appearing to be taking Zetia  PLAN:   He will continue on his same regimen for diabetes including 1 mg Ozempic weekly  Continue to try and check his blood sugars at various times but more after meals  Discussed importance of further weight loss and he will continue to exercise regularly as above and watch portions and carbohydrates  Restart Lescol, new prescription given  Recheck lipids on the next visit  Consider increasing lisinopril to 40 mg for control of blood pressure and high microalbumin   There are no  Patient Instructions on file for this visit.  Elayne Snare 12/15/2020, 9:07 PM   Note: This office note was prepared with Dragon voice recognition system technology. Any transcriptional errors that result from this process are unintentional.

## 2021-01-02 ENCOUNTER — Other Ambulatory Visit: Payer: Self-pay | Admitting: Endocrinology

## 2021-01-18 ENCOUNTER — Other Ambulatory Visit: Payer: Self-pay

## 2021-01-18 ENCOUNTER — Encounter: Payer: Self-pay | Admitting: Family Medicine

## 2021-01-18 ENCOUNTER — Ambulatory Visit: Payer: 59 | Admitting: Family Medicine

## 2021-01-18 VITALS — BP 128/80 | HR 82 | Temp 98.7°F | Resp 17 | Ht 73.0 in | Wt 290.0 lb

## 2021-01-18 DIAGNOSIS — B350 Tinea barbae and tinea capitis: Secondary | ICD-10-CM | POA: Diagnosis not present

## 2021-01-18 MED ORDER — CLOTRIMAZOLE-BETAMETHASONE 1-0.05 % EX CREA: 1.0000 "application " | TOPICAL_CREAM | Freq: Two times a day (BID) | CUTANEOUS | 0 refills | Status: DC

## 2021-01-18 NOTE — Progress Notes (Signed)
   Subjective:    Patient ID: Gregory Fitzgerald, male    DOB: 05-23-1966, 55 y.o.   MRN: [id number]  HPI Rash- pt was using beard dye when he developed what looked like a chemical burn.  This occurred last week.  He had itching and burning.  Shaved his beard.  He had sxs along his L jaw line.  Still has some mild itching and burning.  He has been applying CeraVe w/o relief.   Review of Systems For ROS see HPI   This visit occurred during the SARS-CoV-2 public health emergency.  Safety protocols were in place, including screening questions prior to the visit, additional usage of staff PPE, and extensive cleaning of exam room while observing appropriate contact time as indicated for disinfecting solutions.      Objective:   Physical Exam Vitals reviewed.  Constitutional:      General: He is not in acute distress.    Appearance: Normal appearance. He is not ill-appearing.  HENT:     Head: Normocephalic and atraumatic.  Lymphadenopathy:     Cervical: No cervical adenopathy.  Skin:    General: Skin is warm and dry.     Findings: Rash (hypopigmented rash consistent w/ tinea along L lower jaw line and a few scattered areas on R neck) present.  Neurological:     Mental Status: He is alert.          Assessment & Plan:   Tinea- new.  Reassured pt that this is not a chemical burn due to his beard dye but rather tinea.  Will start Lotrisone cream since the area is so focal/localized.  Reviewed supportive care and red flags that should prompt return.  Pt expressed understanding and is in agreement w/ plan.

## 2021-01-18 NOTE — Patient Instructions (Signed)
Schedule your complete physical at your convenience APPLY the Lotrisone cream twice daily to the areas of concern Call with any questions or concerns- particularly if not improving Hang in there!!!

## 2021-01-19 ENCOUNTER — Encounter: Payer: Self-pay | Admitting: *Deleted

## 2021-01-31 ENCOUNTER — Other Ambulatory Visit: Payer: Self-pay | Admitting: Family Medicine

## 2021-02-06 ENCOUNTER — Encounter (HOSPITAL_BASED_OUTPATIENT_CLINIC_OR_DEPARTMENT_OTHER): Payer: Self-pay | Admitting: Emergency Medicine

## 2021-02-06 ENCOUNTER — Other Ambulatory Visit: Payer: Self-pay

## 2021-02-06 ENCOUNTER — Emergency Department (HOSPITAL_BASED_OUTPATIENT_CLINIC_OR_DEPARTMENT_OTHER)
Admission: EM | Admit: 2021-02-06 | Discharge: 2021-02-06 | Disposition: A | Discharge status: Home / Self Care | Payer: 59 | Attending: Emergency Medicine | Admitting: Emergency Medicine

## 2021-02-06 ENCOUNTER — Other Ambulatory Visit: Payer: Self-pay | Admitting: Family Medicine

## 2021-02-06 DIAGNOSIS — Z7984 Long term (current) use of oral hypoglycemic drugs: Secondary | ICD-10-CM | POA: Insufficient documentation

## 2021-02-06 DIAGNOSIS — E1169 Type 2 diabetes mellitus with other specified complication: Secondary | ICD-10-CM | POA: Insufficient documentation

## 2021-02-06 DIAGNOSIS — E785 Hyperlipidemia, unspecified: Secondary | ICD-10-CM | POA: Diagnosis not present

## 2021-02-06 DIAGNOSIS — Z794 Long term (current) use of insulin: Secondary | ICD-10-CM | POA: Diagnosis not present

## 2021-02-06 DIAGNOSIS — Z7982 Long term (current) use of aspirin: Secondary | ICD-10-CM | POA: Insufficient documentation

## 2021-02-06 DIAGNOSIS — Z79899 Other long term (current) drug therapy: Secondary | ICD-10-CM | POA: Insufficient documentation

## 2021-02-06 DIAGNOSIS — R519 Headache, unspecified: Secondary | ICD-10-CM | POA: Insufficient documentation

## 2021-02-06 DIAGNOSIS — I10 Essential (primary) hypertension: Secondary | ICD-10-CM | POA: Diagnosis not present

## 2021-02-06 MED ORDER — CARBAMAZEPINE 200 MG PO TABS: ORAL_TABLET | ORAL | 0 refills | Status: DC

## 2021-02-06 NOTE — Discharge Instructions (Addendum)
I have represcribed your carbamazepine.  Please take as prescribed.  Please follow-up with your primary care provider within the week.

## 2021-02-06 NOTE — ED Triage Notes (Signed)
Pt arrives pov with c/o L side ear, face and jaw pain. Pt endorses dx with trigeminal neuralgia, and pain returned this weekend.

## 2021-02-06 NOTE — ED Provider Notes (Signed)
[facility] EMERGENCY DEPARTMENT Provider Note   CSN: [id number] Arrival date & time: 02/06/21  1405     History No chief complaint on file.   Gregory Fitzgerald is a 55 y.o. male.  HPI Patient is a 55 year old male presented today with complaints of left-sided facial pain.  He states that his burning episodic and states that he was diagnosed with trigeminal neuralgia several years ago started on carbamazepine but stopped taking it approximately 8 months ago.  He states that he stopped taking it simply because he did not like taking medication.  He was not told to stop taking this medication.  He states that the pain seems lasting only seconds at a time he states he has not had any headaches nausea slurred speech confusion or facial numbness or weakness.  He states that it hurts more with touch.  States that he is completely pain-free when the episodes do not causing him pain.  He states he is currently pain-free but came to the ER because his symptoms are severe.  He states that he is following up with his primary care provider this week however could not wait given the severity of the pain.  Denies any chest pain shortness of breath lightheadedness dizziness.  No other associate symptoms.    Past Medical History:  Diagnosis Date   Arthritis    knees   Diabetes ([place])    GERD (gastroesophageal reflux disease)    years ago, not currently    Hyperlipidemia    Hypertension    Trigeminal neuralgia     Patient Active Problem List   Diagnosis Date Noted   Physical exam 02/24/2019   Morbid obesity ([place]) 09/05/2018   Trigeminal neuralgia    Diabetes mellitus type II, controlled ([place]) 02/23/2014   Hypercholesterolemia 02/23/2014   Essential hypertension, benign 02/23/2014   Erectile dysfunction due to diseases classified elsewhere 02/23/2014    Past Surgical History:  Procedure Laterality Date   club foot surgery     age 102 months   KNEE SURGERY     x 2 in high school         Family History  Problem Relation Age of Onset   Diabetes Mother    Diabetes Father    Lymphoma Father    Colon cancer Neg Hx    Colon polyps Neg Hx    Esophageal cancer Neg Hx    Rectal cancer Neg Hx    Stomach cancer Neg Hx     Social History   Tobacco Use   Smoking status: Never   Smokeless tobacco: Never  Substance Use Topics   Alcohol use: Yes    Comment: occasionally    Drug use: Never    Home Medications Prior to Admission medications   Medication Sig Start Date End Date Taking? Authorizing Provider  carbamazepine (TEGRETOL) 200 MG tablet 200mg  twice daily 02/06/21  Yes Caylan Schifano, Ryder S, PA  aspirin 81 MG tablet Take 81 mg by mouth daily.    [provider]  BD PEN NEEDLE NANO U/F 32G X 4 MM MISC USE 1 PER DAY TO INJECT VICTOZA 09/01/19   Elayne Snare, MD  clotrimazole-betamethasone (LOTRISONE) cream Apply 1 application topically 2 (two) times daily. 01/18/21   Midge Minium, MD  ezetimibe (ZETIA) 10 MG tablet Take 1 tablet (10 mg total) by mouth daily. 08/12/20   Elayne Snare, MD  fluvastatin XL (LESCOL XL) 80 MG 24 hr tablet Take 1 tablet (80 mg total) by mouth  daily. 12/15/20   Elayne Snare, MD  glucose blood (FREESTYLE LITE) test strip Use as instructed bid 08/12/20   Elayne Snare, MD  Insulin Pen Needle (NOVOFINE) 32G X 6 MM MISC Use 1 per day to inject Victoza 06/18/17   Elayne Snare, MD  Insulin Pen Needle (NOVOFINE) 32G X 6 MM MISC USE 1 PER DAY TO INJECT VICTOZA 07/02/18   Elayne Snare, MD  lisinopril (ZESTRIL) 20 MG tablet TAKE 1 TABLET BY MOUTH EVERY DAY 09/21/20   Elayne Snare, MD  OZEMPIC, 1 MG/DOSE, 4 MG/3ML SOPN INJECT 1 MG INTO THE SKIN ONCE A WEEK. 11/29/20   Elayne Snare, MD  tadalafil (CIALIS) 20 MG tablet TAKE 1 TABLET EVERY DAY AS NEEDED FOR ERECTILE DYSFUNCTION 07/29/18   Elayne Snare, MD  XIGDUO XR 11-998 MG TB24 TAKE 2 TABLETS BY MOUTH EVERY DAY 01/02/21   Elayne Snare, MD    Allergies    Patient has no known allergies.  Review of Systems    Review of Systems  Constitutional:  Negative for fever.  HENT:  Negative for congestion.   Respiratory:  Negative for shortness of breath.   Cardiovascular:  Negative for chest pain.  Gastrointestinal:  Negative for abdominal distention.  Neurological:  Negative for dizziness and headaches.       Left face pain   Physical Exam Updated Vital Signs BP 134/90 (BP Location: Left Arm)   Pulse 77   Temp 98.1 F (36.7 C) (Oral)   Resp 18   Ht 6\' 1"  (1.854 m)   Wt 129.3 kg   SpO2 94%   BMI 37.60 kg/m   Physical Exam Vitals and nursing note reviewed.  Constitutional:      General: He is not in acute distress.    Appearance: Normal appearance. He is not ill-appearing.  HENT:     Head: Normocephalic and atraumatic.  Eyes:     General: No scleral icterus.       Right eye: No discharge.        Left eye: No discharge.     Conjunctiva/sclera: Conjunctivae normal.  Pulmonary:     Effort: Pulmonary effort is normal.     Breath sounds: No stridor.  Skin:    General: Skin is warm and dry.  Neurological:     Mental Status: He is alert and oriented to person, place, and time. Mental status is at baseline.     Comments: Alert and oriented to self, place, time and event.   Speech is fluent, clear without dysarthria or dysphasia.   Strength 5/5 in upper/lower extremities   Sensation intact in upper/lower extremities   Normal gait.  Normal finger-to-nose and feet tapping.  CN I not tested  CN II grossly intact visual fields bilaterally. Did not visualize posterior eye.  CN III, IV, VI PERRLA and EOMs intact bilaterally  CN V Intact sensation to sharp and light touch to the face  CN VII facial movements symmetric  CN VIII not tested  CN IX, X no uvula deviation, symmetric rise of soft palate  CN XI 5/5 SCM and trapezius strength bilaterally  CN XII Midline tongue protrusion, symmetric L/R movements      ED Results / Procedures / Treatments   Labs (all labs ordered are listed,  but only abnormal results are displayed) Labs Reviewed - No data to display  EKG None  Radiology No results found.  Procedures Procedures   Medications Ordered in ED Medications - No data to display  ED Course  I have reviewed the triage vital signs and the nursing notes.  Pertinent labs & imaging results that were available during my care of the patient were reviewed by me and considered in my medical decision making (see chart for details).    MDM Rules/Calculators/A&P                          Pt w hx of trigeminal neuralgia  Complaining of symptoms of left facial pain.  Neurologically intact.   States pain since this weekend.   No other symptoms.   Offered additional workup to pt who declined.   Well appearing. VS WNLs.   Final Clinical Impression(s) / ED Diagnoses Final diagnoses:  Facial pain    Rx / DC Orders ED Discharge Orders          Ordered    carbamazepine (TEGRETOL) 200 MG tablet        02/06/21 1546             Tedd Sias, [place] 02/06/21 [place], [place], DO 02/06/21 1849

## 2021-02-08 ENCOUNTER — Ambulatory Visit: Payer: 59 | Admitting: Family Medicine

## 2021-02-08 ENCOUNTER — Other Ambulatory Visit: Payer: Self-pay

## 2021-02-08 ENCOUNTER — Encounter: Payer: Self-pay | Admitting: Family Medicine

## 2021-02-08 VITALS — BP 117/80 | HR 87 | Temp 96.8°F | Resp 18 | Ht 73.0 in | Wt 289.2 lb

## 2021-02-08 DIAGNOSIS — G500 Trigeminal neuralgia: Secondary | ICD-10-CM | POA: Diagnosis not present

## 2021-02-08 MED ORDER — CARBAMAZEPINE 200 MG PO TABS: 200.0000 mg | ORAL_TABLET | Freq: Two times a day (BID) | ORAL | 3 refills | Status: DC

## 2021-02-08 MED ORDER — METHOCARBAMOL 500 MG PO TABS: 500.0000 mg | ORAL_TABLET | Freq: Three times a day (TID) | ORAL | 1 refills | Status: DC | PRN

## 2021-02-08 NOTE — Progress Notes (Signed)
   Subjective:    Patient ID: Gregory Fitzgerald, male    DOB: [birth year], 55 y.o.   MRN: [id number]  HPI L facial pain- pt went to UC on 7/16 and was told he had a dental infxn.  He was prescribed PCN and instructed to f/u w/ dentist.  Gregory Fitzgerald to ER on 7/17 and was dx'd w/ recurrence of trigeminal neuralgia.  Was restarted on Tegretol which he had previously been on but stopped on his own.  Pt reports pain feels similar to previous when he was dx'd w/ trigeminal neuralgia.  Pt reports pain is 'unbearable' and 'bends me over'.  No improvement w/ Excedrin, Ibuprofen, Aleve.  Some improvement w/ muscle relaxer.  Pain starts as a tingle in L lower face and then travels back to ear.  Described as 'lightning pain'  Unable to touch face or apply any sort of pressure when he has the pain.  Pt reports on Sunday pain would last for ~1 hr.  Last night did not have any episodes but had facial tingling.    Reviewed UC note, ER note.   Review of Systems For ROS see HPI   This visit occurred during the SARS-CoV-2 public health emergency.  Safety protocols were in place, including screening questions prior to the visit, additional usage of staff PPE, and extensive cleaning of exam room while observing appropriate contact time as indicated for disinfecting solutions.      Objective:   Physical Exam Vitals reviewed.  Constitutional:      General: He is not in acute distress.    Appearance: Normal appearance. He is not ill-appearing.  HENT:     Head: Normocephalic and atraumatic.     Left Ear: Tympanic membrane, ear canal and external ear normal.  Eyes:     Extraocular Movements: Extraocular movements intact.     Conjunctiva/sclera: Conjunctivae normal.     Pupils: Pupils are equal, round, and reactive to light.  Musculoskeletal:        General: No tenderness (no TTP over L jaw or lower face).     Cervical back: Normal range of motion and neck supple.  Lymphadenopathy:     Cervical: No cervical adenopathy.   Neurological:     Mental Status: He is alert.          Assessment & Plan:

## 2021-02-08 NOTE — Assessment & Plan Note (Signed)
Deteriorated.  Pt was dx'd in 2020 and sxs had resolved while on Tegretol.  He stopped this medication w/o medical instruction and had been doing well until this weekend.  UC misdiagnosed a dental infxn and started PCN.  ER recognized his sxs as trigeminal neuralgia recurrence and restarted Tegretol.  I extended the prescription today and refilled the Robaxin as pt states this helps when he has pain bc he clenches his jaw.  Will refer to neurology for ongoing care or re-assessment.  Pt expressed understanding and is in agreement w/ plan.

## 2021-02-08 NOTE — Patient Instructions (Signed)
Follow up as needed or as scheduled CONTINUE the Carbamazepine (Tegretol) twice daily to treat the nerve pain USE the Methocarbamol (Robaxin) as needed for muscle spasm/tightness We'll call you with your Neurology appt Finish the Penicillin as directed- although I don't think this is helping things as I don't see any evidence of infection Call with any questions or concerns Hang in there!

## 2021-02-26 ENCOUNTER — Other Ambulatory Visit: Payer: Self-pay | Admitting: Endocrinology

## 2021-03-06 ENCOUNTER — Other Ambulatory Visit: Payer: Self-pay | Admitting: Endocrinology

## 2021-03-13 ENCOUNTER — Other Ambulatory Visit: Payer: Self-pay | Admitting: Endocrinology

## 2021-03-13 DIAGNOSIS — E1165 Type 2 diabetes mellitus with hyperglycemia: Secondary | ICD-10-CM

## 2021-03-14 ENCOUNTER — Other Ambulatory Visit: Payer: Self-pay

## 2021-03-14 DIAGNOSIS — E1165 Type 2 diabetes mellitus with hyperglycemia: Secondary | ICD-10-CM

## 2021-03-14 MED ORDER — FREESTYLE LITE TEST VI STRP: ORAL_STRIP | 3 refills | Status: DC

## 2021-03-15 ENCOUNTER — Other Ambulatory Visit: Payer: Self-pay | Admitting: Endocrinology

## 2021-03-15 DIAGNOSIS — E1165 Type 2 diabetes mellitus with hyperglycemia: Secondary | ICD-10-CM

## 2021-03-15 MED ORDER — FREESTYLE LITE TEST VI STRP: ORAL_STRIP | 3 refills | Status: DC

## 2021-03-24 ENCOUNTER — Other Ambulatory Visit: Payer: Self-pay | Admitting: Endocrinology

## 2021-03-31 ENCOUNTER — Other Ambulatory Visit: Payer: Self-pay | Admitting: Family Medicine

## 2021-03-31 NOTE — Telephone Encounter (Signed)
LFD 01/18/21 #45g with no refills LOV 02/08/21 NOV none

## 2021-04-15 ENCOUNTER — Telehealth: Payer: Self-pay | Admitting: Endocrinology

## 2021-04-15 DIAGNOSIS — E669 Obesity, unspecified: Secondary | ICD-10-CM

## 2021-04-15 DIAGNOSIS — E1169 Type 2 diabetes mellitus with other specified complication: Secondary | ICD-10-CM

## 2021-04-15 NOTE — Telephone Encounter (Signed)
Per pharmacy (FREESTYLE LITE) not covered by INS. Please prescribe blood glucose monitor and strips and the pharmacy can choose what's covered by insurance and go from there. [facility] on [address] Pts contact [phone]

## 2021-04-18 MED ORDER — BLOOD GLUCOSE MONITOR KIT: PACK | 0 refills | Status: AC

## 2021-04-18 NOTE — Telephone Encounter (Signed)
Rx sent in to pharmacy. 

## 2021-04-20 ENCOUNTER — Other Ambulatory Visit: Payer: Self-pay

## 2021-04-20 ENCOUNTER — Other Ambulatory Visit (INDEPENDENT_AMBULATORY_CARE_PROVIDER_SITE_OTHER): Payer: 59

## 2021-04-20 DIAGNOSIS — E1169 Type 2 diabetes mellitus with other specified complication: Secondary | ICD-10-CM

## 2021-04-20 DIAGNOSIS — E7800 Pure hypercholesterolemia, unspecified: Secondary | ICD-10-CM | POA: Diagnosis not present

## 2021-04-20 DIAGNOSIS — E1129 Type 2 diabetes mellitus with other diabetic kidney complication: Secondary | ICD-10-CM | POA: Diagnosis not present

## 2021-04-20 DIAGNOSIS — R809 Proteinuria, unspecified: Secondary | ICD-10-CM

## 2021-04-20 DIAGNOSIS — E669 Obesity, unspecified: Secondary | ICD-10-CM | POA: Diagnosis not present

## 2021-04-20 LAB — LIPID PANEL
Cholesterol: 194 mg/dL (ref 0–200)
HDL: 55.5 mg/dL (ref >39.00)
LDL Cholesterol: 118 mg/dL — ABNORMAL HIGH (ref 0–99)
NonHDL: 138.81
Total CHOL/HDL Ratio: 4
Triglycerides: 106 mg/dL (ref 0.0–149.0)
VLDL: 21.2 mg/dL (ref 0.0–40.0)

## 2021-04-20 LAB — COMPREHENSIVE METABOLIC PANEL
ALT: 16 U/L (ref 0–53)
AST: 19 U/L (ref 0–37)
Albumin: 3.9 g/dL (ref 3.5–5.2)
Alkaline Phosphatase: 64 U/L (ref 39–117)
BUN: 20 mg/dL (ref 6–23)
CO2: 23 mEq/L (ref 19–32)
Calcium: 9.2 mg/dL (ref 8.4–10.5)
Chloride: 105 mEq/L (ref 96–112)
Creatinine, Ser: 0.91 mg/dL (ref 0.40–1.50)
GFR: 96.47 mL/min (ref >60.00)
Glucose, Bld: 84 mg/dL (ref 70–99)
Potassium: 4.1 mEq/L (ref 3.5–5.1)
Sodium: 137 mEq/L (ref 135–145)
Total Bilirubin: 0.5 mg/dL (ref 0.2–1.2)
Total Protein: 6.9 g/dL (ref 6.0–8.3)

## 2021-04-20 LAB — MICROALBUMIN / CREATININE URINE RATIO
Creatinine,U: 91.1 mg/dL
Microalb Creat Ratio: 117.9 mg/g — ABNORMAL HIGH (ref 0.0–30.0)
Microalb, Ur: 107.5 mg/dL — ABNORMAL HIGH (ref 0.0–1.9)

## 2021-04-20 LAB — HEMOGLOBIN A1C: Hgb A1c MFr Bld: 4.9 % (ref 4.6–6.5)

## 2021-04-21 ENCOUNTER — Ambulatory Visit: Payer: 59 | Admitting: Endocrinology

## 2021-04-22 ENCOUNTER — Ambulatory Visit: Payer: 59 | Admitting: Endocrinology

## 2021-04-22 ENCOUNTER — Other Ambulatory Visit: Payer: Self-pay

## 2021-04-22 VITALS — BP 122/82 | HR 70 | Ht 73.0 in | Wt 280.0 lb

## 2021-04-22 DIAGNOSIS — E669 Obesity, unspecified: Secondary | ICD-10-CM

## 2021-04-22 DIAGNOSIS — E1129 Type 2 diabetes mellitus with other diabetic kidney complication: Secondary | ICD-10-CM | POA: Diagnosis not present

## 2021-04-22 DIAGNOSIS — Z23 Encounter for immunization: Secondary | ICD-10-CM

## 2021-04-22 DIAGNOSIS — E1169 Type 2 diabetes mellitus with other specified complication: Secondary | ICD-10-CM

## 2021-04-22 DIAGNOSIS — R809 Proteinuria, unspecified: Secondary | ICD-10-CM

## 2021-04-22 DIAGNOSIS — E7800 Pure hypercholesterolemia, unspecified: Secondary | ICD-10-CM

## 2021-04-22 MED ORDER — ACCU-CHEK AVIVA PLUS W/DEVICE KIT: PACK | 0 refills | Status: AC

## 2021-04-22 MED ORDER — ACCU-CHEK AVIVA PLUS VI STRP: ORAL_STRIP | 3 refills | Status: DC

## 2021-04-22 MED ORDER — ACCU-CHEK SOFTCLIX LANCETS MISC: 3 refills | Status: DC

## 2021-04-22 NOTE — Progress Notes (Signed)
Patient ID: Gregory Fitzgerald, male   DOB: 08/23/1965, 55 y.o.   MRN: [id number]     Reason for Appointment: Endocrinology follow-up  History of Present Illness:          Diagnosis: Type 2 diabetes mellitus, date of diagnosis: 2006        Past history: At diagnosis he was not having any symptoms and glucose was discovered incidentally when he was found to be hypertensive He had been on metformin for several years and at some point was also taking Amaryl His blood sugars had been significantly high since about 2013 with A1c around 10% On his initial consultation he was taking 1.8 mg Victoza along with metformin ER 1500 mg daily Baseline A1c was 9.7% Because of his obesity he was started on Invokana 100 mg for better control; Amaryl 2 mg was added because of fasting hyperglycemia A1c had come down to 6.6 in October 2015  Recent history:   Non-insulin hypoglycemic drugs prescribed: Xigduo 11/998 mg , 2 tablets daily, Ozempic 1 mg weekly  A1c is lower than expected at 4.9, was 5.2   Current blood sugar patterns and management: Since his last visit he has maintained excellent control  He is watching his portions and calories and exercising at least 4 days a week now at the gym  .  He does both aerobic and resistance exercises  He has lost another 20 pounds, previously lost about 30 pounds Blood sugars are averaging below 100 although not checking after meals much  No side effects with Xigduo or Ozempic  Renal function also stable  He works 3rd shift Dinner usually around 4 pm which is his main meal  Side effects from medications have been: None  GLUCOSE MONITORING:   Blood sugars from download RANGE 86-114, median 96 Blood sugars being done mostly mornings or afternoons, none after 4 PM  Previously  PRE-MEAL  overnight  midday  afternoon  evening Overall  Glucose range:     77-154  Mean/median:  96  103  87  94  95    Self-care:  .  His meals are usually  meat  and vegetables. He will have low carbohydrate snacks          Dietician visit: Most recent: 2008 .   Wt Readings from Last 3 Encounters:  04/22/21 280 lb (127 kg)  02/08/21 289 lb 3.2 oz (131.2 kg)  02/06/21 285 lb (129.3 kg)      Lab Results  Component Value Date   HGBA1C 4.9 04/20/2021   HGBA1C 5.2 11/30/2020   HGBA1C 8.0 (H) 08/06/2020   Lab Results  Component Value Date   MICROALBUR 107.5 (H) 04/20/2021   LDLCALC 118 (H) 04/20/2021   CREATININE 0.91 04/20/2021       OTHER active problems are discussed in review of systems               Allergies as of 04/22/2021   No Known Allergies      Medication List        Accurate as of April 22, 2021 10:18 AM. If you have any questions, ask your nurse or doctor.          aspirin 81 MG tablet Take 81 mg by mouth daily.   blood glucose meter kit and supplies Kit Dispense based on patient and insurance preference. Use up to two times daily as directed.   carbamazepine 200 MG tablet Commonly known as: TEGretol Take 1 tablet (  200 mg total) by mouth 2 (two) times daily.   clotrimazole-betamethasone cream Commonly known as: LOTRISONE APPLY TO AFFECTED AREA TWICE A DAY   ezetimibe 10 MG tablet Commonly known as: ZETIA TAKE 1 TABLET BY MOUTH EVERY DAY   fluvastatin XL 80 MG 24 hr tablet Commonly known as: Lescol XL Take 1 tablet (80 mg total) by mouth daily.   FREESTYLE LITE test strip Generic drug: glucose blood USE AS INSTRUCTED TWICE A DAY   Insulin Pen Needle 32G X 6 MM Misc Commonly known as: NovoFine Use 1 per day to inject Victoza   Insulin Pen Needle 32G X 6 MM Misc Commonly known as: NovoFine USE 1 PER DAY TO INJECT VICTOZA   BD Pen Needle Nano U/F 32G X 4 MM Misc Generic drug: Insulin Pen Needle USE 1 PER DAY TO INJECT VICTOZA   lisinopril 20 MG tablet Commonly known as: ZESTRIL TAKE 1 TABLET BY MOUTH EVERY DAY   methocarbamol 500 MG tablet Commonly known as: Robaxin Take 1 tablet  (500 mg total) by mouth every 8 (eight) hours as needed for muscle spasms.   Ozempic (1 MG/DOSE) 4 MG/3ML Sopn Generic drug: Semaglutide (1 MG/DOSE) INJECT 1 MG INTO THE SKIN ONCE A WEEK.   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 He has never used smokeless tobacco. He reports current alcohol use. He reports that he does not use drugs.    Review of Systems        Lipids: treated with Lipitor 20 mg previously but this was stopped because of leg stiffness; also may have had some side effects of leg stiffness from Crestor    He has no leg stiffness or aching with Lescol, taking 80 mg generic  Zetia was prescribed to help with his control but he states his pharmacy has been out of stock of this LDL is still over 100      Lab Results  Component Value Date   CHOL 194 04/20/2021   CHOL 187 08/06/2020   CHOL 166 03/08/2020   Lab Results  Component Value Date   HDL 55.50 04/20/2021   HDL 47.30 08/06/2020   HDL 49.80 03/08/2020   Lab Results  Component Value Date   LDLCALC 118 (H) 04/20/2021   LDLCALC 114 (H) 08/06/2020   [place] 92  03/08/2020   Lab Results  Component  Value Date   TRIG 106.0 04/20/2021   TRIG 129.0 08/06/2020   TRIG 123.0 03/08/2020   Lab Results  Component Value Date   CHOLHDL 4 04/20/2021   CHOLHDL 4 08/06/2020   CHOLHDL 3 03/08/2020   Lab Results  Component Value Date   LDLDIRECT 107.0 11/30/2020     HYPERTENSION: Treated with 20 mg lisinopril  Also has microalbuminuria since at least 2018 and this appears to be increased now    BP Readings from Last 3 Encounters:  04/22/21 122/82  02/08/21 117/80  02/06/21 080/22      Complications: Erectile dysfunction. Treated with Cialis 31m as needed    Diabetic foot exam in 12/21 showed normal monofilament sensation, Decreased pulses in feet, calluses Rt distal foot He has been followed for his calluses elsewhere  Physical Examination:  BP 122/82   Pulse 70   Ht 6' 1"  (1.854 m)   Wt 280 lb (127 kg)   SpO2 95%   BMI 36.94 kg/m   Feet are normal to inspection, has some callus formation on the right plantar surface No edema    ASSESSMENT:  Diabetes type 2, non-insulin-dependent  See history of present illness for discussion of current diabetes management, blood sugar patterns and problems identified  A1c is markedly improved at 4.9, previously 5.2  With Xigduo and weekly 1.0 mg Ozempic his blood sugars excellent and  He has lost a total of 50 pounds since getting back on a good diet and exercise regimen and increasing Ozempic  Blood sugars are fairly  normal at home although difficult to know which readings are postprandial and is monitoring infrequently   HYPERTENSION: On lisinopril 20 mg Although blood pressure appears controlled his microalbumin is increased  LIPIDS: LDL is still above 100 and needs to get his Zetia from the pharmacy  Currently doing well with Lescol and no side effects compared to Lipitor and Crestor  Renal function stable and normal  PLAN:  He will stay on Xigduo and 1 mg Ozempic  weekly Reminded him to try and check his blood sugars at various times, more 2 hours after meals LISINOPRIL will be increased to 20 mg twice daily and next prescription will be for 40 mg once a day if he has no lightheadedness or low blood pressure He will follow-up with his PCP for blood pressure measurement also Continue following microalbumin Influenza vaccine given  Follow-up in 4 months   There are no Patient Instructions on file for this visit.  AElayne Snare9/30/2022, 10:18 AM   Note: This office note was prepared with Dragon voice recognition system technology. Any transcriptional errors that result from this process are unintentional.

## 2021-04-22 NOTE — Patient Instructions (Signed)
Take Lisinopril twice daily

## 2021-05-08 ENCOUNTER — Other Ambulatory Visit: Payer: Self-pay | Admitting: Endocrinology

## 2021-05-08 ENCOUNTER — Other Ambulatory Visit: Payer: Self-pay | Admitting: Family Medicine

## 2021-05-26 ENCOUNTER — Ambulatory Visit: Payer: 59 | Admitting: Neurology

## 2021-05-26 ENCOUNTER — Encounter: Payer: Self-pay | Admitting: Neurology

## 2021-05-26 VITALS — BP 99/65 | HR 70 | Ht 72.0 in | Wt 286.0 lb

## 2021-05-26 DIAGNOSIS — R519 Headache, unspecified: Secondary | ICD-10-CM | POA: Insufficient documentation

## 2021-05-26 MED ORDER — SUMATRIPTAN SUCCINATE 100 MG PO TABS: 100.0000 mg | ORAL_TABLET | ORAL | 11 refills | Status: DC | PRN

## 2021-05-26 NOTE — Progress Notes (Signed)
Chief Complaint  Patient presents with   New Patient (Initial Visit)    New rm, alone, referral for trigeminal neuralgia, reports no pain today       ASSESSMENT AND PLAN  Gregory Fitzgerald is a 55 y.o. male   Left facial pain  Atypical for left trigeminal neuralgia  MRI of the brain with without contrast to rule out left trigeminal structural abnormality  Responding well to Tegretol 200 mg twice a day, will continue  May try Imitrex as needed if he has a pain flareup  Return to clinic in 2 months    DIAGNOSTIC DATA (LABS, IMAGING, TESTING) - I reviewed patient records, labs, notes, testing and imaging myself where available. Laboratory evaluation in 2022, A1c 4.9, normal CMP, creatinine 0.9, LDL118,  MEDICAL HISTORY:  Gregory Fitzgerald is a 55 year old male, seen in request by his primary care physician Dr. For Annye Asa elevation of left facial pain, initial evaluation was on May 26, 2021.   I reviewed and summarized the referring note. PMHX DM since 2008 HTN HLD Left club foot surgery.  He had 2 episodes of left facial pain, initially in 2021, lasting for few weeks, responded well to Tegretol, then he stopped taking that.    The second episode was from April to August 2022, longer lasting, more severe, he described left lower jaw pain, radiating towards left ear, then spreading to left temporal region, constant sharp deep achy pain, occasionally throbbing, 9 out of 10,  He presented to the emergency room on February 06, 2021, per record, the description of the pain was the pain only lasting seconds at a time no nausea, left facial pain on touch,  He was given prescription of Tegretol 200 mg twice a day, since he started medicine regularly, he has been pain-free,  He denies visual change, denies hearing loss, denies previous history of headache  PHYSICAL EXAM:   Vitals:   05/26/21 1001  BP: 99/65  Pulse: 70  Weight: 286 lb (129.7 kg)  Height: 6' (1.829 m)    Not recorded     Body mass index is 38.79 kg/m.  PHYSICAL EXAMNIATION:  Gen: NAD, conversant, well nourised, well groomed                     Cardiovascular: Regular rate rhythm, no peripheral edema, warm, nontender. Eyes: Conjunctivae clear without exudates or hemorrhage Neck: Supple, no carotid bruits. Pulmonary: Clear to auscultation bilaterally   NEUROLOGICAL EXAM:  MENTAL STATUS: Speech:    Speech is normal; fluent and spontaneous with normal comprehension.  Cognition:     Orientation to time, place and person     Normal recent and remote memory     Normal Attention span and concentration     Normal Language, naming, repeating,spontaneous speech     Fund of knowledge   CRANIAL NERVES: CN II: Visual fields are full to confrontation. Pupils are round equal and briskly reactive to light. CN III, IV, VI: extraocular movement are normal. No ptosis. CN V: Facial sensation is intact to light touch, bilateral corneal reflexes were normal and present CN VII: Face is symmetric with normal eye closure  CN VIII: Hearing is normal to causal conversation. CN IX, X: Phonation is normal. CN XI: Head turning and shoulder shrug are intact  MOTOR: There is no pronator drift of out-stretched arms. Muscle bulk and tone are normal. Muscle strength is normal.  REFLEXES: Reflexes are 2+ and symmetric at the biceps, triceps,  knees, and ankles. Plantar responses are flexor.  SENSORY: Intact to light touch, pinprick and vibratory sensation are intact in fingers and toes.  COORDINATION: There is no trunk or limb dysmetria noted.  GAIT/STANCE: Posture is normal. Gait is steady with normal steps, base, arm swing, and turning. Heel and toe walking are normal. Tandem gait is normal.  Romberg is absent.  REVIEW OF SYSTEMS:  Full 14 system review of systems performed and notable only for as above All other review of systems were negative.   ALLERGIES: No Known Allergies  HOME  MEDICATIONS: Current Outpatient Medications  Medication Sig Dispense Refill   Accu-Chek Softclix Lancets lancets Use to check blood sugar twice a day 100 each 3   aspirin 81 MG tablet Take 81 mg by mouth daily.     BD PEN NEEDLE NANO U/F 32G X 4 MM MISC USE 1 PER DAY TO INJECT VICTOZA 100 each 4   blood glucose meter kit and supplies KIT Dispense based on patient and insurance preference. Use up to two times daily as directed. 1 each 0   Blood Glucose Monitoring Suppl (ACCU-CHEK AVIVA PLUS) w/Device KIT Use to check blood sugar twice a daily. 1 kit 0   carbamazepine (TEGRETOL) 200 MG tablet TAKE 1 TABLET BY MOUTH TWICE A DAY 180 tablet 1   clotrimazole-betamethasone (LOTRISONE) cream APPLY TO AFFECTED AREA TWICE A DAY 45 g 0   ezetimibe (ZETIA) 10 MG tablet TAKE 1 TABLET BY MOUTH EVERY DAY 90 tablet 1   fluvastatin XL (LESCOL XL) 80 MG 24 hr tablet TAKE 1 TABLET BY MOUTH EVERY DAY 90 tablet 1   glucose blood (ACCU-CHEK AVIVA PLUS) test strip Use to check blood sugar twice a day 100 each 3   Insulin Pen Needle (NOVOFINE) 32G X 6 MM MISC Use 1 per day to inject Victoza 50 each 5   Insulin Pen Needle (NOVOFINE) 32G X 6 MM MISC USE 1 PER DAY TO INJECT VICTOZA 100 each 5   lisinopril (ZESTRIL) 20 MG tablet TAKE 1 TABLET BY MOUTH EVERY DAY 90 tablet 1   methocarbamol (ROBAXIN) 500 MG tablet TAKE 1 TABLET BY MOUTH EVERY 8 HOURS AS NEEDED FOR MUSCLE SPASMS. 45 tablet 1   OZEMPIC, 1 MG/DOSE, 4 MG/3ML SOPN INJECT 1 MG INTO THE SKIN ONCE A WEEK. 3 mL 3   tadalafil (CIALIS) 20 MG tablet TAKE 1 TABLET EVERY DAY AS NEEDED FOR ERECTILE DYSFUNCTION 10 tablet 2   XIGDUO XR 11-998 MG TB24 TAKE 2 TABLETS BY MOUTH EVERY DAY 60 tablet 1   No current facility-administered medications for this visit.    PAST MEDICAL HISTORY: Past Medical History:  Diagnosis Date   Arthritis    knees   Diabetes ([place])    GERD (gastroesophageal reflux disease)    years ago, not currently    Hyperlipidemia    Hypertension     Trigeminal neuralgia     PAST SURGICAL HISTORY: Past Surgical History:  Procedure Laterality Date   club foot surgery     age 66 months   KNEE SURGERY     x 2 in high school     FAMILY HISTORY: Family History  Problem Relation Age of Onset   Diabetes Mother    Diabetes Father    Lymphoma Father    Colon cancer Neg Hx    Colon polyps Neg Hx    Esophageal cancer Neg Hx    Rectal cancer Neg Hx    Stomach cancer Neg Hx  SOCIAL HISTORY: Social History   Socioeconomic History   Marital status: Married    Spouse name: Not on file   Number of children: Not on file   Years of education: Not on file   Highest education level: Not on file  Occupational History   Not on file  Tobacco Use   Smoking status: Never   Smokeless tobacco: Never  Substance and Sexual Activity   Alcohol use: Yes    Comment: occasionally    Drug use: Never   Sexual activity: Not on file  Other Topics Concern   Not on file  Social History Narrative   Not on file   Social Determinants of Health   Financial Resource Strain: Not on file  Food Insecurity: Not on file  Transportation Needs: Not on file  Physical Activity: Not on file  Stress: Not on file  Social Connections: Not on file  Intimate Partner Violence: Not on file      Marcial Pacas, M.D. Ph.D.  [facility] [address], [place], [place] [address] Ph: [phone] Fax: [phone]  CC:  Midge Minium, MD [address] [place],  [place] [address]  Midge Minium, MD

## 2021-06-01 ENCOUNTER — Telehealth: Payer: Self-pay | Admitting: Neurology

## 2021-06-01 NOTE — Telephone Encounter (Signed)
LVM for pt to call back to schedule  [employer] auth: NPR via [employer]

## 2021-07-11 ENCOUNTER — Other Ambulatory Visit: Payer: Self-pay | Admitting: Endocrinology

## 2021-08-02 ENCOUNTER — Ambulatory Visit: Payer: 59 | Admitting: Neurology

## 2021-08-02 ENCOUNTER — Encounter: Payer: Self-pay | Admitting: Neurology

## 2021-08-14 ENCOUNTER — Other Ambulatory Visit: Payer: Self-pay | Admitting: Endocrinology

## 2021-08-26 ENCOUNTER — Ambulatory Visit: Payer: 59 | Admitting: Endocrinology

## 2021-08-29 ENCOUNTER — Other Ambulatory Visit (INDEPENDENT_AMBULATORY_CARE_PROVIDER_SITE_OTHER): Payer: 59

## 2021-08-29 DIAGNOSIS — E669 Obesity, unspecified: Secondary | ICD-10-CM

## 2021-08-29 DIAGNOSIS — E1169 Type 2 diabetes mellitus with other specified complication: Secondary | ICD-10-CM | POA: Diagnosis not present

## 2021-08-29 DIAGNOSIS — E7800 Pure hypercholesterolemia, unspecified: Secondary | ICD-10-CM | POA: Diagnosis not present

## 2021-08-29 LAB — COMPREHENSIVE METABOLIC PANEL
ALT: 9 U/L (ref 0–53)
AST: 14 U/L (ref 0–37)
Albumin: 4.1 g/dL (ref 3.5–5.2)
Alkaline Phosphatase: 53 U/L (ref 39–117)
BUN: 21 mg/dL (ref 6–23)
CO2: 23 mEq/L (ref 19–32)
Calcium: 9 mg/dL (ref 8.4–10.5)
Chloride: 107 mEq/L (ref 96–112)
Creatinine, Ser: 0.88 mg/dL (ref 0.40–1.50)
GFR: 98.18 mL/min (ref >60.00)
Glucose, Bld: 100 mg/dL — ABNORMAL HIGH (ref 70–99)
Potassium: 3.8 mEq/L (ref 3.5–5.1)
Sodium: 139 mEq/L (ref 135–145)
Total Bilirubin: 0.5 mg/dL (ref 0.2–1.2)
Total Protein: 6.4 g/dL (ref 6.0–8.3)

## 2021-08-29 LAB — LIPID PANEL
Cholesterol: 219 mg/dL — ABNORMAL HIGH (ref 0–200)
HDL: 52.6 mg/dL (ref >39.00)
LDL Cholesterol: 145 mg/dL — ABNORMAL HIGH (ref 0–99)
NonHDL: 166.08
Total CHOL/HDL Ratio: 4
Triglycerides: 105 mg/dL (ref 0.0–149.0)
VLDL: 21 mg/dL (ref 0.0–40.0)

## 2021-08-29 LAB — MICROALBUMIN / CREATININE URINE RATIO
Creatinine,U: 96.7 mg/dL
Microalb Creat Ratio: 181 mg/g — ABNORMAL HIGH (ref 0.0–30.0)
Microalb, Ur: 175 mg/dL — ABNORMAL HIGH (ref 0.0–1.9)

## 2021-08-29 LAB — HEMOGLOBIN A1C: Hgb A1c MFr Bld: 4.9 % (ref 4.6–6.5)

## 2021-08-30 ENCOUNTER — Telehealth: Payer: Self-pay

## 2021-08-30 ENCOUNTER — Other Ambulatory Visit (HOSPITAL_COMMUNITY): Payer: Self-pay

## 2021-08-30 NOTE — Telephone Encounter (Signed)
Patient Advocate Encounter   Received notification from [facility] that prior authorization for Invokamet XR 150-1000mg  tabs is required by his/her insurance [employer].   PA submitted on 08/30/21  Key#: [id number]  Status is pending    [facility] will continue to follow:  Patient Advocate Fax: [phone]

## 2021-08-31 ENCOUNTER — Other Ambulatory Visit (HOSPITAL_COMMUNITY): Payer: Self-pay

## 2021-08-31 NOTE — Telephone Encounter (Signed)
Received notification from [facility]) regarding a prior authorization for INVOKAMET XR 150/1000. Authorization has been APPROVED from 2.7.23 to 2.7.24.   Per test claim, copay for 30 days supply is $28    Authorization # PA Case ID: [id number]

## 2021-09-06 ENCOUNTER — Encounter: Payer: Self-pay | Admitting: Endocrinology

## 2021-09-06 ENCOUNTER — Ambulatory Visit: Payer: 59 | Admitting: Endocrinology

## 2021-09-06 ENCOUNTER — Other Ambulatory Visit: Payer: Self-pay

## 2021-09-06 VITALS — BP 138/90 | HR 63 | Ht 72.0 in | Wt 285.4 lb

## 2021-09-06 DIAGNOSIS — E7800 Pure hypercholesterolemia, unspecified: Secondary | ICD-10-CM | POA: Diagnosis not present

## 2021-09-06 DIAGNOSIS — I10 Essential (primary) hypertension: Secondary | ICD-10-CM | POA: Diagnosis not present

## 2021-09-06 DIAGNOSIS — R809 Proteinuria, unspecified: Secondary | ICD-10-CM

## 2021-09-06 DIAGNOSIS — E1129 Type 2 diabetes mellitus with other diabetic kidney complication: Secondary | ICD-10-CM | POA: Diagnosis not present

## 2021-09-06 DIAGNOSIS — E1169 Type 2 diabetes mellitus with other specified complication: Secondary | ICD-10-CM

## 2021-09-06 DIAGNOSIS — E669 Obesity, unspecified: Secondary | ICD-10-CM

## 2021-09-06 MED ORDER — EZETIMIBE 10 MG PO TABS: 10.0000 mg | ORAL_TABLET | Freq: Every day | ORAL | 1 refills | Status: DC

## 2021-09-06 MED ORDER — LISINOPRIL 40 MG PO TABS: 40.0000 mg | ORAL_TABLET | Freq: Every day | ORAL | 3 refills | Status: DC

## 2021-09-06 NOTE — Progress Notes (Signed)
Patient ID: Gregory Fitzgerald, male   DOB: 1966-03-22, 56 y.o.   MRN: [id number]     Reason for Appointment: Endocrinology follow-up  History of Present Illness:          Diagnosis: Type 2 diabetes mellitus, date of diagnosis: 2006        Past history: At diagnosis he was not having any symptoms and glucose was discovered incidentally when he was found to be hypertensive He had been on metformin for several years and at some point was also taking Amaryl His blood sugars had been significantly high since about 2013 with A1c around 10% On his initial consultation he was taking 1.8 mg Victoza along with metformin ER 1500 mg daily Baseline A1c was 9.7% Because of his obesity he was started on Invokana 100 mg for better control; Amaryl 2 mg was added because of fasting hyperglycemia A1c had come down to 6.6 in October 2015  Recent history:   Non-insulin hypoglycemic drugs prescribed: Xigduo 11/998 mg, 2 tablets daily, Ozempic 1 mg weekly  A1c is again lower than expected at 4.9    Current blood sugar patterns and management: He appears to be checking his blood sugars intermittently and only has readings in the last week or so Most of his blood sugars appear to be in the morning when he comes back from work Blood sugars are averaging below 100 again not checking after meals and only a couple of readings around midday No side effects with Xigduo or Ozempic  Renal function again stable He is still trying to do his exercise routine or walking Has been able to maintain his weight loss  He works 3rd shift Dinner usually around 4 pm which is his main meal  Side effects from medications have been: None  GLUCOSE MONITORING:    Blood sugars from download: RANGE 82-96, average 93, previously 96 Blood sugars being done mostly mornings, none after 4 PM   Self-care:  .  His meals are usually  meat and vegetables. He will have low carbohydrate snacks          Dietician visit:  Most recent: 2008 .   Wt Readings from Last 3 Encounters:  09/06/21 285 lb 6.4 oz (129.5 kg)  05/26/21 286 lb (129.7 kg)  04/22/21 280 lb (127 kg)      Lab Results  Component Value Date   HGBA1C 4.9 08/29/2021   HGBA1C 4.9 04/20/2021   HGBA1C 5.2 11/30/2020   Lab Results  Component Value Date   MICROALBUR 175.0 (H) 08/29/2021   LDLCALC 145 (H) 08/29/2021   CREATININE 0.88 08/29/2021       OTHER active problems are discussed in review of systems               Allergies as of 09/06/2021   No Known Allergies      Medication List        Accurate as of September 06, 2021 10:25 AM. If you have any questions, ask your nurse or doctor.          Accu-Chek Aviva Plus test strip Generic drug: glucose blood Use to check blood sugar twice a day   Accu-Chek Aviva Plus w/Device Kit Use to check blood sugar twice a daily.   Accu-Chek Softclix Lancets lancets Use to check blood sugar twice a day   aspirin 81 MG tablet Take 81 mg by mouth daily.   blood glucose meter kit and supplies Kit Dispense based on patient and  insurance preference. Use up to two times daily as directed.   carbamazepine 200 MG tablet Commonly known as: TEGRETOL TAKE 1 TABLET BY MOUTH TWICE A DAY   clotrimazole-betamethasone cream Commonly known as: LOTRISONE APPLY TO AFFECTED AREA TWICE A DAY   ezetimibe 10 MG tablet Commonly known as: ZETIA TAKE 1 TABLET BY MOUTH EVERY DAY   fluvastatin XL 80 MG 24 hr tablet Commonly known as: LESCOL XL TAKE 1 TABLET BY MOUTH EVERY DAY   Insulin Pen Needle 32G X 6 MM Misc Commonly known as: NovoFine Use 1 per day to inject Victoza   Insulin Pen Needle 32G X 6 MM Misc Commonly known as: NovoFine USE 1 PER DAY TO INJECT VICTOZA   BD Pen Needle Nano U/F 32G X 4 MM Misc Generic drug: Insulin Pen Needle USE 1 PER DAY TO INJECT VICTOZA   lisinopril 20 MG tablet Commonly known as: ZESTRIL TAKE 1 TABLET BY MOUTH EVERY DAY   methocarbamol 500 MG  tablet Commonly known as: ROBAXIN TAKE 1 TABLET BY MOUTH EVERY 8 HOURS AS NEEDED FOR MUSCLE SPASMS.   Ozempic (1 MG/DOSE) 4 MG/3ML Sopn Generic drug: Semaglutide (1 MG/DOSE) INJECT 1 MG INTO THE SKIN ONCE A WEEK.   SUMAtriptan 100 MG tablet Commonly known as: Imitrex Take 1 tablet (100 mg total) by mouth every 2 (two) hours as needed for migraine. May repeat in 2 hours if headache persists or recurs.   tadalafil 20 MG tablet Commonly known as: Cialis TAKE 1 TABLET EVERY DAY AS NEEDED FOR ERECTILE DYSFUNCTION   Xigduo XR 11-998 MG Tb24 Generic drug: Dapagliflozin-metFORMIN HCl ER TAKE 2 TABLETS BY MOUTH EVERY DAY        Allergies: No Known Allergies  Past Medical History:  Diagnosis Date   Arthritis    knees   Diabetes ([place])    GERD (gastroesophageal reflux disease)    years ago, not currently    Hyperlipidemia    Hypertension    Trigeminal neuralgia     Past Surgical History:  Procedure Laterality Date   club foot surgery     age 61 months   KNEE SURGERY     x 2 in high school     Family History  Problem Relation Age of Onset   Diabetes Mother    Diabetes Father    Lymphoma Father    Colon cancer Neg Hx    Colon polyps Neg Hx    Esophageal cancer Neg Hx    Rectal cancer Neg Hx    Stomach cancer Neg Hx     Social History:  reports that he has never smoked. He has never used smokeless tobacco. He reports current alcohol use. He reports that he does not use drugs.    Review of Systems        Lipids: treated with Lipitor 20 mg previously but this was stopped because of leg stiffness; also may have had some side effects of leg stiffness from Crestor    He has no leg stiffness or aching with Lescol, taking 80 mg generic  Zetia was prescribed to help with his control but he for some reason has not filled the prescription but he thinks he is taking Lescol regularly  LDL is still over above target      Lab Results  Component Value Date   CHOL 219 (H)  08/29/2021   CHOL 194 04/20/2021   CHOL 187 08/06/2020   Lab Results  Component Value Date   HDL 52.60  08/29/2021   HDL 55.50 04/20/2021   HDL 47.30 08/06/2020   Lab Results  Component Value Date   LDLCALC 145 (H) 08/29/2021   LDLCALC 118 (H) 04/20/2021   LDLCALC 114 (H) 08/06/2020   Lab Results  Component Value Date   TRIG 105.0 08/29/2021   TRIG 106.0 04/20/2021   TRIG 129.0 08/06/2020   Lab Results  Component Value Date   CHOLHDL 4 08/29/2021   CHOLHDL 4 04/20/2021   CHOLHDL 4 08/06/2020   Lab Results  Component Value Date   LDLDIRECT 107.0 11/30/2020     HYPERTENSION: Treated with 20 mg lisinopril He was supposed to take 40 mg daily starting on his last visit in September but he forgot Not monitoring at home  Also has microalbuminuria since at least 2018 and this appears to be further increased now    BP Readings from Last 3 Encounters:  09/06/21 138/90  05/26/21 99/65  04/22/21 117/35      Complications: Erectile dysfunction. Treated with Cialis $RemoveBef'20mg'RnvulldPMH$  as needed    Diabetic foot exam in 12/21 showed normal monofilament sensation, Decreased pulses in feet, calluses Rt distal foot He has been followed for his calluses elsewhere  Physical Examination:  BP 138/90    Pulse 63    Ht 6' (1.829 m)    Wt 285 lb 6.4 oz (129.5 kg)    SpO2 95%    BMI 38.71 kg/m       ASSESSMENT:  Diabetes type 2, non-insulin-dependent  See history of present illness for discussion of current diabetes management, blood sugar patterns and problems identified  A1c is consistently improved at 4.9, previously 5.2  With Xigduo and weekly 1.0 mg Ozempic his blood sugars excellent He has maintained his weight loss Continues to watch his diet and exercise although exercise level may not be as much as before  Blood sugars are fairly  normal at home although mostly checking after he comes back from work and not after meals, main meal at 4 PM  Reminded him to check blood sugars  around 6 PM   HYPERTENSION: On lisinopril 20 mg Today blood pressure appears higher and his microalbumin is increased further  LIPIDS: LDL is significantly higher without taking Zetia Sent new prescription to the pharmacy  Currently doing well with Lescol without any muscle aches or stiffness    PLAN:  He will stay on Xigduo and 1 mg Ozempic weekly, currently since blood sugars are well controlled he does not need to switch back to Invokamet which is also authorized currently Reminded him to try and check his blood sugars at various times, more 2 hours after meals LISINOPRIL will be increased to 40 mg twice daily and 40 mg prescription has been sent  He will follow-up with his PCP for blood pressure measurement in about a month or so Continue following microalbumin Regular exercise at least 30 minutes 4 times a week   Follow-up in 4 months   There are no Patient Instructions on file for this visit.  Elayne Snare 09/06/2021, 10:25 AM   Note: This office note was prepared with Dragon voice recognition system technology. Any transcriptional errors that result from this process are unintentional.

## 2021-09-06 NOTE — Patient Instructions (Signed)
Send report of eye exam   Check blood sugars on waking up 1-2 days a week  Also check blood sugars about 2 hours after meals and do this after different meals by rotation  Recommended blood sugar levels on waking up are 90-120 and about 2 hours after meal is 130-160  Please bring your blood sugar monitor to each visit, thank you  Lisinopril 40mg  daily

## 2021-09-14 ENCOUNTER — Other Ambulatory Visit: Payer: Self-pay | Admitting: Endocrinology

## 2021-11-11 ENCOUNTER — Other Ambulatory Visit: Payer: Self-pay | Admitting: Physical Medicine and Rehabilitation

## 2021-11-11 DIAGNOSIS — M5416 Radiculopathy, lumbar region: Secondary | ICD-10-CM

## 2021-11-13 ENCOUNTER — Other Ambulatory Visit: Payer: Self-pay | Admitting: Family Medicine

## 2021-11-15 ENCOUNTER — Other Ambulatory Visit: Payer: Self-pay | Admitting: Physical Medicine and Rehabilitation

## 2021-11-15 ENCOUNTER — Ambulatory Visit
Admission: RE | Admit: 2021-11-15 | Discharge: 2021-11-15 | Disposition: A | Discharge status: Home / Self Care | Payer: 59 | Source: Ambulatory Visit | Attending: Physical Medicine and Rehabilitation | Admitting: Physical Medicine and Rehabilitation

## 2021-11-15 ENCOUNTER — Ambulatory Visit
Admission: RE | Admit: 2021-11-15 | Discharge: 2021-11-15 | Disposition: A | Discharge status: Home / Self Care | Payer: Self-pay | Source: Ambulatory Visit | Attending: Physical Medicine and Rehabilitation | Admitting: Physical Medicine and Rehabilitation

## 2021-11-15 DIAGNOSIS — M5416 Radiculopathy, lumbar region: Secondary | ICD-10-CM

## 2021-11-15 MED ORDER — METHYLPREDNISOLONE ACETATE 40 MG/ML INJ SUSP (RADIOLOG
120.0000 mg | Freq: Once | INTRAMUSCULAR | Status: AC
  Administered 2021-11-15: 120 mg via EPIDURAL

## 2021-11-15 MED ORDER — IOPAMIDOL (ISOVUE-M 200) INJECTION 41%
1.0000 mL | Freq: Once | INTRAMUSCULAR | Status: AC
  Administered 2021-11-15: 1 mL via EPIDURAL

## 2021-11-15 NOTE — Discharge Instructions (Signed)

## 2021-11-18 ENCOUNTER — Other Ambulatory Visit: Payer: Self-pay | Admitting: Endocrinology

## 2021-11-21 ENCOUNTER — Other Ambulatory Visit: Payer: Self-pay | Admitting: Physical Medicine and Rehabilitation

## 2021-11-21 DIAGNOSIS — M5416 Radiculopathy, lumbar region: Secondary | ICD-10-CM

## 2021-11-24 ENCOUNTER — Other Ambulatory Visit: Payer: Self-pay | Admitting: Endocrinology

## 2021-11-30 ENCOUNTER — Other Ambulatory Visit: Payer: Self-pay | Admitting: Physical Medicine and Rehabilitation

## 2021-11-30 ENCOUNTER — Ambulatory Visit
Admission: RE | Admit: 2021-11-30 | Discharge: 2021-11-30 | Disposition: A | Discharge status: Home / Self Care | Payer: 59 | Source: Ambulatory Visit | Attending: Physical Medicine and Rehabilitation | Admitting: Physical Medicine and Rehabilitation

## 2021-11-30 DIAGNOSIS — M5416 Radiculopathy, lumbar region: Secondary | ICD-10-CM

## 2021-11-30 MED ORDER — IOPAMIDOL (ISOVUE-M 200) INJECTION 41%
1.0000 mL | Freq: Once | INTRAMUSCULAR | Status: AC
  Administered 2021-11-30: 1 mL via EPIDURAL

## 2021-11-30 MED ORDER — METHYLPREDNISOLONE ACETATE 40 MG/ML INJ SUSP (RADIOLOG
80.0000 mg | Freq: Once | INTRAMUSCULAR | Status: AC
  Administered 2021-11-30: 80 mg via EPIDURAL

## 2021-11-30 NOTE — Discharge Instructions (Signed)

## 2022-01-02 ENCOUNTER — Other Ambulatory Visit (INDEPENDENT_AMBULATORY_CARE_PROVIDER_SITE_OTHER): Payer: 59

## 2022-01-02 DIAGNOSIS — E1169 Type 2 diabetes mellitus with other specified complication: Secondary | ICD-10-CM

## 2022-01-02 DIAGNOSIS — E669 Obesity, unspecified: Secondary | ICD-10-CM | POA: Diagnosis not present

## 2022-01-02 DIAGNOSIS — E7800 Pure hypercholesterolemia, unspecified: Secondary | ICD-10-CM

## 2022-01-02 LAB — COMPREHENSIVE METABOLIC PANEL
ALT: 10 U/L (ref 0–53)
AST: 15 U/L (ref 0–37)
Albumin: 3.8 g/dL (ref 3.5–5.2)
Alkaline Phosphatase: 63 U/L (ref 39–117)
BUN: 13 mg/dL (ref 6–23)
CO2: 27 mEq/L (ref 19–32)
Calcium: 9.5 mg/dL (ref 8.4–10.5)
Chloride: 106 mEq/L (ref 96–112)
Creatinine, Ser: 0.86 mg/dL (ref 0.40–1.50)
GFR: 98.62 mL/min (ref >60.00)
Glucose, Bld: 115 mg/dL — ABNORMAL HIGH (ref 70–99)
Potassium: 4.2 mEq/L (ref 3.5–5.1)
Sodium: 140 mEq/L (ref 135–145)
Total Bilirubin: 0.5 mg/dL (ref 0.2–1.2)
Total Protein: 6.6 g/dL (ref 6.0–8.3)

## 2022-01-02 LAB — LIPID PANEL
Cholesterol: 174 mg/dL (ref 0–200)
HDL: 61.9 mg/dL (ref >39.00)
LDL Cholesterol: 92 mg/dL (ref 0–99)
NonHDL: 112.28
Total CHOL/HDL Ratio: 3
Triglycerides: 100 mg/dL (ref 0.0–149.0)
VLDL: 20 mg/dL (ref 0.0–40.0)

## 2022-01-02 LAB — HEMOGLOBIN A1C: Hgb A1c MFr Bld: 5.7 % (ref 4.6–6.5)

## 2022-01-04 ENCOUNTER — Ambulatory Visit: Payer: 59 | Admitting: Endocrinology

## 2022-01-04 ENCOUNTER — Encounter: Payer: Self-pay | Admitting: Endocrinology

## 2022-01-04 VITALS — BP 122/84 | HR 85 | Ht 72.0 in | Wt 297.8 lb

## 2022-01-04 DIAGNOSIS — I10 Essential (primary) hypertension: Secondary | ICD-10-CM | POA: Diagnosis not present

## 2022-01-04 DIAGNOSIS — E669 Obesity, unspecified: Secondary | ICD-10-CM | POA: Diagnosis not present

## 2022-01-04 DIAGNOSIS — E1129 Type 2 diabetes mellitus with other diabetic kidney complication: Secondary | ICD-10-CM | POA: Diagnosis not present

## 2022-01-04 DIAGNOSIS — R809 Proteinuria, unspecified: Secondary | ICD-10-CM

## 2022-01-04 DIAGNOSIS — E1169 Type 2 diabetes mellitus with other specified complication: Secondary | ICD-10-CM | POA: Diagnosis not present

## 2022-01-04 LAB — MICROALBUMIN / CREATININE URINE RATIO
Creatinine,U: 77.8 mg/dL
Microalb Creat Ratio: 214 mg/g — ABNORMAL HIGH (ref 0.0–30.0)
Microalb, Ur: 166.5 mg/dL — ABNORMAL HIGH (ref 0.0–1.9)

## 2022-01-04 MED ORDER — KERENDIA 10 MG PO TABS: ORAL_TABLET | ORAL | 2 refills | Status: DC

## 2022-01-04 NOTE — Progress Notes (Signed)
Patient ID: Gregory Fitzgerald, male   DOB: 03-07-1966, 56 y.o.   MRN: [id number]     Reason for Appointment: Endocrinology follow-up  History of Present Illness:          Diagnosis: Type 2 diabetes mellitus, date of diagnosis: 2006        Past history: At diagnosis he was not having any symptoms and glucose was discovered incidentally when he was found to be hypertensive He had been on metformin for several years and at some point was also taking Amaryl His blood sugars had been significantly high since about 2013 with A1c around 10% On his initial consultation he was taking 1.8 mg Victoza along with metformin ER 1500 mg daily Baseline A1c was 9.7% Because of his obesity he was started on Invokana 100 mg for better control; Amaryl 2 mg was added because of fasting hyperglycemia A1c had come down to 6.6 in October 2015  Recent history:   Non-insulin hypoglycemic drugs prescribed: Xigduo 11/998 mg, 2 tablets daily, Ozempic 1 mg weekly  A1c is again lower than expected at 5.7    Current blood sugar patterns and management: He is forgetting to check his blood sugars and has checked only a couple of readings recently  Lab glucose 115 nonfasting No side effects with Xigduo or Ozempic 1 mg Renal function is stable He is just recovering from a lot of problems with sciatica and was quite inactive causing weight gain Only recently is starting to walk, recently walking about 2 miles  He works 3rd shift Dinner usually around 4 pm which is his main meal  Side effects from medications have been: None  GLUCOSE MONITORING:   Blood sugar yesterday 105, today 100 fasting Blood sugars PREVIOUSLY from download: RANGE 82-96, average 93, previously 96 Blood sugars being done mostly mornings, none after 4 PM   Self-care:  .  His meals are usually  meat and vegetables. He will have low carbohydrate snacks          Dietician visit: Most recent: 2008 .   Wt Readings from Last 3  Encounters:  01/04/22 297 lb 12.8 oz (135.1 kg)  09/06/21 285 lb 6.4 oz (129.5 kg)  05/26/21 286 lb (129.7 kg)      Lab Results  Component Value Date   HGBA1C 5.7 01/02/2022   HGBA1C 4.9 08/29/2021   HGBA1C 4.9 04/20/2021   Lab Results  Component Value Date   MICROALBUR 166.5 (H) 01/04/2022   LDLCALC 92 01/02/2022   CREATININE 0.86 01/02/2022       OTHER active problems are discussed in review of systems               Allergies as of 01/04/2022   No Known Allergies      Medication List        Accurate as of January 04, 2022  5:03 PM. If you have any questions, ask your nurse or doctor.          Accu-Chek Aviva Plus test strip Generic drug: glucose blood Use to check blood sugar twice a day   Accu-Chek Aviva Plus w/Device Kit Use to check blood sugar twice a daily.   Accu-Chek Softclix Lancets lancets Use to check blood sugar twice a day   aspirin 81 MG tablet Take 81 mg by mouth daily.   blood glucose meter kit and supplies Kit Dispense based on patient and insurance preference. Use up to two times daily as directed.   carbamazepine  200 MG tablet Commonly known as: TEGRETOL TAKE 1 TABLET BY MOUTH TWICE A DAY   clotrimazole-betamethasone cream Commonly known as: LOTRISONE APPLY TO AFFECTED AREA TWICE A DAY   ezetimibe 10 MG tablet Commonly known as: ZETIA Take 1 tablet (10 mg total) by mouth daily.   fluvastatin XL 80 MG 24 hr tablet Commonly known as: LESCOL XL TAKE 1 TABLET BY MOUTH EVERY DAY   Insulin Pen Needle 32G X 6 MM Misc Commonly known as: NovoFine Use 1 per day to inject Victoza   Insulin Pen Needle 32G X 6 MM Misc Commonly known as: NovoFine USE 1 PER DAY TO INJECT VICTOZA   BD Pen Needle Nano U/F 32G X 4 MM Misc Generic drug: Insulin Pen Needle USE 1 PER DAY TO INJECT VICTOZA   lisinopril 40 MG tablet Commonly known as: ZESTRIL Take 1 tablet (40 mg total) by mouth daily.   methocarbamol 500 MG tablet Commonly known as:  ROBAXIN TAKE 1 TABLET BY MOUTH EVERY 8 HOURS AS NEEDED FOR MUSCLE SPASMS.   Mobic 15 MG tablet Generic drug: meloxicam Mobic 15 mg tablet  Take 1 tablet every day by oral route as needed for 30 days.  take with meals   Ozempic (1 MG/DOSE) 4 MG/3ML Sopn Generic drug: Semaglutide (1 MG/DOSE) INJECT 1MG INTO THE SKIN ONCE A WEEK   SUMAtriptan 100 MG tablet Commonly known as: Imitrex Take 1 tablet (100 mg total) by mouth every 2 (two) hours as needed for migraine. May repeat in 2 hours if headache persists or recurs.   tadalafil 20 MG tablet Commonly known as: Cialis TAKE 1 TABLET EVERY DAY AS NEEDED FOR ERECTILE DYSFUNCTION   Xigduo XR 11-998 MG Tb24 Generic drug: Dapagliflozin-metFORMIN HCl ER TAKE 2 TABLETS BY MOUTH EVERY DAY        Allergies: No Known Allergies  Past Medical History:  Diagnosis Date   Arthritis    knees   Diabetes ([place])    GERD (gastroesophageal reflux disease)    years ago, not currently    Hyperlipidemia    Hypertension    Trigeminal neuralgia     Past Surgical History:  Procedure Laterality Date   club foot surgery     age 2 months   KNEE SURGERY     x 2 in high school     Family History  Problem Relation Age of Onset   Diabetes Mother    Diabetes Father    Lymphoma Father    Colon cancer Neg Hx    Colon polyps Neg Hx    Esophageal cancer Neg Hx    Rectal cancer Neg Hx    Stomach cancer Neg Hx     Social History:  reports that he has never smoked. He has never used smokeless tobacco. He reports current alcohol use. He reports that he does not use drugs.    Review of Systems        Lipids: treated with Lipitor 20 mg previously but this was stopped because of leg stiffness; also may have had some side effects of leg stiffness from Crestor    He has no leg stiffness or aching with Lescol, taking 80 mg generic  Zetia was prescribed again on his last visit and LDL is improved      Lab Results  Component Value Date   CHOL 174  01/02/2022   CHOL 219 (H) 08/29/2021   CHOL 194 04/20/2021   Lab Results  Component Value Date   HDL 61.90  01/02/2022   HDL 52.60 08/29/2021   HDL 55.50 04/20/2021   Lab Results  Component Value Date   LDLCALC 92 01/02/2022   LDLCALC 145 (H) 08/29/2021   LDLCALC 118 (H) 04/20/2021   Lab Results  Component Value Date   TRIG 100.0 01/02/2022   TRIG 105.0 08/29/2021   TRIG 106.0 04/20/2021   Lab Results  Component Value Date   CHOLHDL 3 01/02/2022   CHOLHDL 4 08/29/2021   CHOLHDL 4 04/20/2021   Lab Results  Component Value Date   LDLDIRECT 107.0 11/30/2020     HYPERTENSION: Treated with 40 mg lisinopril Dose was increased on the last visit Not monitoring at home  Also has microalbuminuria since at least 2018 and this appears to be further increased now    BP Readings from Last 3 Encounters:  01/04/22 122/84  11/30/21 (!) 143/92  11/15/21 208/13      Complications: Erectile dysfunction. Treated with Cialis 29m as needed    Diabetic foot exam in 6/23 showed normal monofilament sensation, Decreased pulses in feet, calluses Rt distal foot   Physical Examination:  BP 122/84   Pulse 85   Ht 6' (1.829 m)   Wt 297 lb 12.8 oz (135.1 kg)   SpO2 99%   BMI 40.39 kg/m   Diabetic Foot Exam - Simple   Simple Foot Form Diabetic Foot exam was performed with the following findings: Yes   Visual Inspection No deformities, no ulcerations, no other skin breakdown bilaterally: Yes Sensation Testing Intact to touch and monofilament testing bilaterally: Yes Pulse Check Posterior Tibialis and Dorsalis pulse intact bilaterally: Yes Comments Some calluses present        ASSESSMENT:  Diabetes type 2, non-insulin-dependent  See history of present illness for discussion of current diabetes management, blood sugar patterns and problems identified  A1c is consistently near normal at 5.7  With Xigduo and weekly 1.0 mg Ozempic his blood sugars are well controlled  but he has not checked his sugars recently Although he has gained weight because of lack of exercise he is generally watching his diet and glucose control  appears adequate    HYPERTENSION: On lisinopril 40 mg Today blood pressure is relatively better with 40 mg lisinopril  LIPIDS: LDL is back to normal with adding Zetia    PLAN:  He does need to periodically check blood sugars alternating fasting and after meals No change in medications  Recheck microalbumin  Follow-up in 4 months   There are no Patient Instructions on file for this visit.  AElayne Snare6/14/2023, 5:03 PM   Note: This office note was prepared with Dragon voice recognition system technology. Any transcriptional errors that result from this process are unintentional.  Addendum: Urine protein is still high, start [place] Clampif covered, needs follow-up BMP in 3 weeks

## 2022-01-19 ENCOUNTER — Other Ambulatory Visit: Payer: Self-pay

## 2022-01-19 DIAGNOSIS — M5416 Radiculopathy, lumbar region: Secondary | ICD-10-CM

## 2022-01-24 ENCOUNTER — Other Ambulatory Visit: Payer: Self-pay | Admitting: Endocrinology

## 2022-01-26 ENCOUNTER — Ambulatory Visit
Admission: RE | Admit: 2022-01-26 | Discharge: 2022-01-26 | Disposition: A | Discharge status: Home / Self Care | Payer: 59 | Source: Ambulatory Visit

## 2022-01-26 ENCOUNTER — Other Ambulatory Visit: Payer: Self-pay

## 2022-01-26 DIAGNOSIS — M5416 Radiculopathy, lumbar region: Secondary | ICD-10-CM

## 2022-01-26 MED ORDER — METHYLPREDNISOLONE ACETATE 40 MG/ML INJ SUSP (RADIOLOG
80.0000 mg | Freq: Once | INTRAMUSCULAR | Status: AC
  Administered 2022-01-26: 80 mg via EPIDURAL

## 2022-01-26 MED ORDER — IOPAMIDOL (ISOVUE-M 200) INJECTION 41%
1.0000 mL | Freq: Once | INTRAMUSCULAR | Status: AC
  Administered 2022-01-26: 1 mL via EPIDURAL

## 2022-01-26 NOTE — Discharge Instructions (Signed)

## 2022-02-20 ENCOUNTER — Other Ambulatory Visit: Payer: Self-pay | Admitting: Endocrinology

## 2022-03-28 ENCOUNTER — Other Ambulatory Visit: Payer: Self-pay | Admitting: Endocrinology

## 2022-03-30 ENCOUNTER — Other Ambulatory Visit: Payer: Self-pay | Admitting: Endocrinology

## 2022-05-03 ENCOUNTER — Ambulatory Visit: Payer: 59 | Admitting: Endocrinology

## 2022-05-29 ENCOUNTER — Other Ambulatory Visit: Payer: Self-pay | Admitting: Family Medicine

## 2022-06-03 ENCOUNTER — Other Ambulatory Visit: Payer: Self-pay | Admitting: Endocrinology

## 2022-07-02 ENCOUNTER — Other Ambulatory Visit: Payer: Self-pay | Admitting: Endocrinology

## 2022-07-05 ENCOUNTER — Other Ambulatory Visit (INDEPENDENT_AMBULATORY_CARE_PROVIDER_SITE_OTHER): Payer: 59

## 2022-07-05 DIAGNOSIS — E1129 Type 2 diabetes mellitus with other diabetic kidney complication: Secondary | ICD-10-CM | POA: Diagnosis not present

## 2022-07-05 DIAGNOSIS — E1169 Type 2 diabetes mellitus with other specified complication: Secondary | ICD-10-CM | POA: Diagnosis not present

## 2022-07-05 DIAGNOSIS — E669 Obesity, unspecified: Secondary | ICD-10-CM | POA: Diagnosis not present

## 2022-07-05 DIAGNOSIS — R809 Proteinuria, unspecified: Secondary | ICD-10-CM | POA: Diagnosis not present

## 2022-07-05 LAB — BASIC METABOLIC PANEL
BUN: 17 mg/dL (ref 6–23)
CO2: 29 mEq/L (ref 19–32)
Calcium: 9.9 mg/dL (ref 8.4–10.5)
Chloride: 103 mEq/L (ref 96–112)
Creatinine, Ser: 1.18 mg/dL (ref 0.40–1.50)
GFR: 70.03 mL/min (ref >60.00)
Glucose, Bld: 117 mg/dL — ABNORMAL HIGH (ref 70–99)
Potassium: 4.3 mEq/L (ref 3.5–5.1)
Sodium: 141 mEq/L (ref 135–145)

## 2022-07-10 ENCOUNTER — Other Ambulatory Visit: Payer: Self-pay | Admitting: Endocrinology

## 2022-07-10 DIAGNOSIS — E669 Obesity, unspecified: Secondary | ICD-10-CM

## 2022-07-11 ENCOUNTER — Encounter: Payer: Self-pay | Admitting: Endocrinology

## 2022-07-11 ENCOUNTER — Ambulatory Visit: Payer: 59 | Admitting: Endocrinology

## 2022-07-11 VITALS — BP 132/88 | HR 88 | Ht 73.0 in | Wt 329.0 lb

## 2022-07-11 DIAGNOSIS — Z23 Encounter for immunization: Secondary | ICD-10-CM | POA: Diagnosis not present

## 2022-07-11 DIAGNOSIS — R809 Proteinuria, unspecified: Secondary | ICD-10-CM

## 2022-07-11 DIAGNOSIS — E669 Obesity, unspecified: Secondary | ICD-10-CM

## 2022-07-11 DIAGNOSIS — E7800 Pure hypercholesterolemia, unspecified: Secondary | ICD-10-CM | POA: Diagnosis not present

## 2022-07-11 DIAGNOSIS — E1129 Type 2 diabetes mellitus with other diabetic kidney complication: Secondary | ICD-10-CM | POA: Diagnosis not present

## 2022-07-11 DIAGNOSIS — E1169 Type 2 diabetes mellitus with other specified complication: Secondary | ICD-10-CM | POA: Diagnosis not present

## 2022-07-11 LAB — POCT GLYCOSYLATED HEMOGLOBIN (HGB A1C): Hemoglobin A1C: 6.2 % — AB (ref 4.0–5.6)

## 2022-07-11 MED ORDER — DILTIAZEM HCL ER COATED BEADS 120 MG PO CP24
120.0000 mg | ORAL_CAPSULE | Freq: Every day | ORAL | 2 refills | Status: DC
Start: 2022-07-11 — End: 2022-09-10

## 2022-07-11 NOTE — Patient Instructions (Signed)
BP check  

## 2022-07-11 NOTE — Progress Notes (Signed)
Patient ID: Gregory Fitzgerald, male   DOB: [birth year], 56 y.o.   MRN: [id number]     Reason for Appointment: Endocrinology follow-up  History of Present Illness:          Diagnosis: Type 2 diabetes mellitus, date of diagnosis: 2006        Past history: At diagnosis he was not having any symptoms and glucose was discovered incidentally when he was found to be hypertensive He had been on metformin for several years and at some point was also taking Amaryl His blood sugars had been significantly high since about 2013 with A1c around 10% On his initial consultation he was taking 1.8 mg Victoza along with metformin ER 1500 mg daily Baseline A1c was 9.7% Because of his obesity he was started on Invokana 100 mg for better control; Amaryl 2 mg was added because of fasting hyperglycemia A1c had come down to 6.6 in October 2015  Recent history:   Non-insulin hypoglycemic drugs: Xigduo 11/998 mg, 2 tablets daily, Ozempic 1 mg weekly off 2 weeks  A1c is 6.2   Current blood sugar patterns and management: He has not been seen since 6/23 His meter could not be downloaded today and continues to use the freestyle lite meter Also he thinks that his blood sugars are not consistent and he has gained a lot of weight because of inactivity related to his back problems Also he has had difficulty getting consistent refills of Ozempic and has not taken any for 2 weeks Blood sugars recently at home are looking fairly good with highest only 123, checking more readings in the morning then after meals Taking Xigduo consistently recently Has not had a steroid injection since July In the last month or so he has started regular walking  He works 3rd shift  Dinner usually around 4 pm which is his main meal  Side effects from medications have been: None  GLUCOSE MONITORING:   AVERAGE 109 for 30 days Blood sugar range 91-123 with most readings fasting or before lunch or dinner Unable to download  freestyle lite meter         Dietician visit: Most recent: 2008 .   Wt Readings from Last 3 Encounters:  07/11/22 (!) 329 lb (149.2 kg)  01/04/22 297 lb 12.8 oz (135.1 kg)  09/06/21 285 lb 6.4 oz (129.5 kg)      Lab Results  Component Value Date   HGBA1C 6.2 (A) 07/11/2022   HGBA1C 5.7 01/02/2022   HGBA1C 4.9 08/29/2021   Lab Results  Component Value Date   MICROALBUR 166.5 (H) 01/04/2022   LDLCALC 92 01/02/2022   CREATININE 1.18 07/05/2022       OTHER active problems are discussed in review of systems               Allergies as of 07/11/2022   No Known Allergies      Medication List        Accurate as of July 11, 2022  2:44 PM. If you have any questions, ask your nurse or doctor.          Accu-Chek Aviva Plus w/Device Kit Use to check blood sugar twice a daily.   Accu-Chek Softclix Lancets lancets Use to check blood sugar twice a day   aspirin 81 MG tablet Take 81 mg by mouth daily.   blood glucose meter kit and supplies Kit Dispense based on patient and insurance preference. Use up to two times daily as directed.  carbamazepine 200 MG tablet Commonly known as: TEGRETOL TAKE 1 TABLET BY MOUTH TWICE A DAY   clotrimazole-betamethasone cream Commonly known as: LOTRISONE APPLY TO AFFECTED AREA TWICE A DAY   ezetimibe 10 MG tablet Commonly known as: ZETIA TAKE 1 TABLET BY MOUTH EVERY DAY   fluvastatin XL 80 MG 24 hr tablet Commonly known as: LESCOL XL TAKE 1 TABLET BY MOUTH EVERY DAY   FREESTYLE LITE test strip Generic drug: glucose blood USE AS INSTRUCTED TWICE A DAY   Insulin Pen Needle 32G X 6 MM Misc Commonly known as: NovoFine Use 1 per day to inject Victoza   Insulin Pen Needle 32G X 6 MM Misc Commonly known as: NovoFine USE 1 PER DAY TO INJECT VICTOZA   BD Pen Needle Nano U/F 32G X 4 MM Misc Generic drug: Insulin Pen Needle USE 1 PER DAY TO INJECT VICTOZA   Kerendia 10 MG Tabs Generic drug: Finerenone 1 tablet daily    lisinopril 40 MG tablet Commonly known as: ZESTRIL Take 1 tablet (40 mg total) by mouth daily.   methocarbamol 500 MG tablet Commonly known as: ROBAXIN TAKE 1 TABLET BY MOUTH EVERY 8 HOURS AS NEEDED FOR MUSCLE SPASMS.   Mobic 15 MG tablet Generic drug: meloxicam Mobic 15 mg tablet  Take 1 tablet every day by oral route as needed for 30 days.  take with meals   Ozempic (1 MG/DOSE) 4 MG/3ML Sopn Generic drug: Semaglutide (1 MG/DOSE) INJECT 1 MG INTO THE SKIN ONE TIME PER WEEK   SUMAtriptan 100 MG tablet Commonly known as: Imitrex Take 1 tablet (100 mg total) by mouth every 2 (two) hours as needed for migraine. May repeat in 2 hours if headache persists or recurs.   tadalafil 20 MG tablet Commonly known as: Cialis TAKE 1 TABLET EVERY DAY AS NEEDED FOR ERECTILE DYSFUNCTION   Xigduo XR 11-998 MG Tb24 Generic drug: Dapagliflozin Pro-metFORMIN ER TAKE 2 TABLETS BY MOUTH EVERY DAY        Allergies: No Known Allergies  Past Medical History:  Diagnosis Date   Arthritis    knees   Diabetes ([place])    GERD (gastroesophageal reflux disease)    years ago, not currently    Hyperlipidemia    Hypertension    Trigeminal neuralgia     Past Surgical History:  Procedure Laterality Date   club foot surgery     age 71 months   KNEE SURGERY     x 2 in high school     Family History  Problem Relation Age of Onset   Diabetes Mother    Diabetes Father    Lymphoma Father    Colon cancer Neg Hx    Colon polyps Neg Hx    Esophageal cancer Neg Hx    Rectal cancer Neg Hx    Stomach cancer Neg Hx     Social History:  reports that he has never smoked. He has never used smokeless tobacco. He reports current alcohol use. He reports that he does not use drugs.    Review of Systems        Lipids: treated with Lipitor 20 mg previously but this was stopped because of leg stiffness; also may have had some side effects of leg stiffness from Crestor    He has no leg stiffness or  aching with Lescol XL, taking 80 mg   Zetia was prescribed in addition and LDL is improved as of 6/23      Lab Results  Component  Value Date   CHOL 174 01/02/2022   CHOL 219 (H) 08/29/2021   CHOL 194 04/20/2021   Lab Results  Component Value Date   HDL 61.90 01/02/2022   HDL 52.60 08/29/2021   HDL 55.50 04/20/2021   Lab Results  Component Value Date   LDLCALC 92 01/02/2022   LDLCALC 145 (H) 08/29/2021   LDLCALC 118 (H) 04/20/2021   Lab Results  Component Value Date   TRIG 100.0 01/02/2022   TRIG 105.0 08/29/2021   TRIG 106.0 04/20/2021   Lab Results  Component Value Date   CHOLHDL 3 01/02/2022   CHOLHDL 4 08/29/2021   CHOLHDL 4 04/20/2021   Lab Results  Component Value Date   LDLDIRECT 107.0 11/30/2020     HYPERTENSION: Treated with 40 mg lisinopril  Not monitoring at home, rarely measuring at gym, recently 132/70  Also has microalbuminuria since at least 2018 and this appears to be further increased now   Blood pressure checked twice and diastolic is still around 90  BP Readings from Last 3 Encounters:  07/11/22 132/88  01/26/22 133/84  01/04/22 975/88      Complications: Erectile dysfunction. Treated with Cialis 91m as needed    Diabetic foot exam in 6/23 showed normal monofilament sensation, Decreased pulses in feet, calluses Rt distal foot   Physical Examination:  BP 132/88   Pulse 88   Ht _0  (1.854 m)   Wt (!) 329 lb (149.2 kg)   SpO2 92%   BMI 43.41 kg/m   No pedal edema   ASSESSMENT:  Diabetes type 2, non-insulin-dependent  See history of present illness for discussion of current diabetes management, blood sugar patterns and problems identified  A1c is slightly higher at 6.2  Current regimen Xigduo and weekly 1.0 mg Ozempic He has gained a lot of weight which is usually when he is not active and not consistent with diet Also has not taken Ozempic regularly Recent blood sugars look fairly good but not enough readings after  meals  HYPERTENSION: On lisinopril 40 mg Blood pressure control appears inadequate   PLAN:  He does need to periodically check blood sugars after meals also He will [place] if he can get Ozempic more consistently through his mail order program Otherwise continue Xigduo unchanged  Start diltiazem CD 120 mg daily for better blood pressure control and hopefully benefit on microalbuminuria Recheck microalbumin on the next visit Increase frequency and duration of exercise  Follow-up in 4 months   There are no Patient Instructions on file for this visit.  AElayne Snare12/19/2023, 2:44 PM   Note: This office note was prepared with Dragon voice recognition system technology. Any transcriptional errors that result from this process are unintentional.  Flu vaccine given

## 2022-08-04 ENCOUNTER — Other Ambulatory Visit: Payer: Self-pay | Admitting: Endocrinology

## 2022-08-11 ENCOUNTER — Other Ambulatory Visit: Payer: Self-pay | Admitting: Endocrinology

## 2022-09-06 ENCOUNTER — Other Ambulatory Visit: Payer: Self-pay | Admitting: Endocrinology

## 2022-09-09 ENCOUNTER — Other Ambulatory Visit: Payer: Self-pay | Admitting: Endocrinology

## 2022-10-03 ENCOUNTER — Ambulatory Visit: Payer: 59 | Admitting: Endocrinology

## 2022-10-17 ENCOUNTER — Other Ambulatory Visit: Payer: Self-pay | Admitting: Endocrinology

## 2022-11-03 ENCOUNTER — Other Ambulatory Visit (INDEPENDENT_AMBULATORY_CARE_PROVIDER_SITE_OTHER): Payer: 59

## 2022-11-03 DIAGNOSIS — E1169 Type 2 diabetes mellitus with other specified complication: Secondary | ICD-10-CM

## 2022-11-03 DIAGNOSIS — E1129 Type 2 diabetes mellitus with other diabetic kidney complication: Secondary | ICD-10-CM | POA: Diagnosis not present

## 2022-11-03 DIAGNOSIS — R809 Proteinuria, unspecified: Secondary | ICD-10-CM | POA: Diagnosis not present

## 2022-11-03 DIAGNOSIS — E7800 Pure hypercholesterolemia, unspecified: Secondary | ICD-10-CM

## 2022-11-03 DIAGNOSIS — E669 Obesity, unspecified: Secondary | ICD-10-CM | POA: Diagnosis not present

## 2022-11-03 LAB — COMPREHENSIVE METABOLIC PANEL
ALT: 15 U/L (ref 0–53)
AST: 14 U/L (ref 0–37)
Albumin: 4.2 g/dL (ref 3.5–5.2)
Alkaline Phosphatase: 80 U/L (ref 39–117)
BUN: 23 mg/dL (ref 6–23)
CO2: 23 mEq/L (ref 19–32)
Calcium: 9.6 mg/dL (ref 8.4–10.5)
Chloride: 104 mEq/L (ref 96–112)
Creatinine, Ser: 1.2 mg/dL (ref 0.40–1.50)
GFR: 68.48 mL/min (ref >60.00)
Glucose, Bld: 198 mg/dL — ABNORMAL HIGH (ref 70–99)
Potassium: 4.1 mEq/L (ref 3.5–5.1)
Sodium: 137 mEq/L (ref 135–145)
Total Bilirubin: 0.4 mg/dL (ref 0.2–1.2)
Total Protein: 7.4 g/dL (ref 6.0–8.3)

## 2022-11-03 LAB — LIPID PANEL
Cholesterol: 180 mg/dL (ref 0–200)
HDL: 45.7 mg/dL (ref >39.00)
NonHDL: 134.59
Total CHOL/HDL Ratio: 4
Triglycerides: 265 mg/dL — ABNORMAL HIGH (ref 0.0–149.0)
VLDL: 53 mg/dL — ABNORMAL HIGH (ref 0.0–40.0)

## 2022-11-03 LAB — MICROALBUMIN / CREATININE URINE RATIO
Creatinine,U: 81.7 mg/dL
Microalb Creat Ratio: 81.4 mg/g — ABNORMAL HIGH (ref 0.0–30.0)
Microalb, Ur: 66.5 mg/dL — ABNORMAL HIGH (ref 0.0–1.9)

## 2022-11-03 LAB — HEMOGLOBIN A1C: Hgb A1c MFr Bld: 8.8 % — ABNORMAL HIGH (ref 4.6–6.5)

## 2022-11-03 LAB — LDL CHOLESTEROL, DIRECT: Direct LDL: 113 mg/dL

## 2022-11-06 ENCOUNTER — Other Ambulatory Visit: Payer: Self-pay | Admitting: Family Medicine

## 2022-11-06 ENCOUNTER — Other Ambulatory Visit: Payer: Self-pay | Admitting: Endocrinology

## 2022-11-09 ENCOUNTER — Ambulatory Visit: Payer: 59 | Admitting: Endocrinology

## 2022-11-09 ENCOUNTER — Encounter: Payer: Self-pay | Admitting: Endocrinology

## 2022-11-09 VITALS — BP 124/78 | HR 80 | Ht 73.0 in | Wt 344.0 lb

## 2022-11-09 DIAGNOSIS — E7800 Pure hypercholesterolemia, unspecified: Secondary | ICD-10-CM | POA: Diagnosis not present

## 2022-11-09 DIAGNOSIS — E1165 Type 2 diabetes mellitus with hyperglycemia: Secondary | ICD-10-CM

## 2022-11-09 DIAGNOSIS — I10 Essential (primary) hypertension: Secondary | ICD-10-CM | POA: Diagnosis not present

## 2022-11-09 NOTE — Progress Notes (Signed)
Patient ID: Gregory Fitzgerald, male   DOB: 23-Jul-1966, 57 y.o.   MRN: [id number]     Reason for Appointment: Endocrinology follow-up  History of Present Illness:          Diagnosis: Type 2 diabetes mellitus, date of diagnosis: 2006        Past history: At diagnosis he was not having any symptoms and glucose was discovered incidentally when he was found to be hypertensive He had been on metformin for several years and at some point was also taking Amaryl His blood sugars had been significantly high since about 2013 with A1c around 10% On his initial consultation he was taking 1.8 mg Victoza along with metformin ER 1500 mg daily Baseline A1c was 9.7% Because of his obesity he was started on Invokana 100 mg for better control; Amaryl 2 mg was added because of fasting hyperglycemia A1c had come down to 6.6 in October 2015  Recent history:   Non-insulin hypoglycemic drugs: Xigduo 11/998 mg, 2 tablets daily, Ozempic 1 mg weekly   A1c is 8.8 compared to 6.2   Current blood sugar patterns and management: Has had a marked increase in his blood sugars lately He ascribes this to not getting his Ozempic available at the pharmacy although refill history on his medication list indicates he did have a prescription last month He says he has not taken Ozempic for several weeks With this he has not been watching his diet and eating more carbohydrates Also because of not completely recovering from back pain he has not exercise Has gained another 15 pounds  Home blood sugars fasting about 228 and midday 157, 184 Taking Xigduo regularly recently Has had 3 steroid injections in 2023 but not recently  He works 3rd shift  Dinner usually around 4 pm which is his main meal  Side effects from medications have been: None  GLUCOSE MONITORING: With FreeStyle meter, readings as above Previously:  AVERAGE 109 for 30 days Blood sugar range 91-123 with most readings fasting or before lunch or  dinner   Dietician visit: Most recent: 2008 .   Wt Readings from Last 3 Encounters:  11/09/22 (!) 344 lb (156 kg)  07/11/22 (!) 329 lb (149.2 kg)  01/04/22 297 lb 12.8 oz (135.1 kg)      Lab Results  Component Value Date   HGBA1C 8.8 (H) 11/03/2022   HGBA1C 6.2 (A) 07/11/2022   HGBA1C 5.7 01/02/2022   Lab Results  Component Value Date   MICROALBUR 66.5 (H) 11/03/2022   LDLCALC 92 01/02/2022   CREATININE 1.20 11/03/2022       OTHER active problems are discussed in review of systems               Allergies as of 11/09/2022   No Known Allergies      Medication List        Accurate as of November 09, 2022  3:30 PM. If you have any questions, ask your nurse or doctor.          STOP taking these medications    Accu-Chek Softclix Lancets lancets Stopped by: Reather Littler, MD       TAKE these medications    Accu-Chek Aviva Plus w/Device Kit Use to check blood sugar twice a daily.   aspirin 81 MG tablet Take 81 mg by mouth daily.   blood glucose meter kit and supplies Kit Dispense based on patient and insurance preference. Use up to two times daily as directed.  carbamazepine 200 MG tablet Commonly known as: TEGRETOL TAKE 1 TABLET BY MOUTH TWICE A DAY   clotrimazole-betamethasone cream Commonly known as: LOTRISONE APPLY TO AFFECTED AREA TWICE A DAY   diltiazem 120 MG 24 hr capsule Commonly known as: CARDIZEM CD TAKE 1 CAPSULE BY MOUTH EVERY DAY   ezetimibe 10 MG tablet Commonly known as: ZETIA TAKE 1 TABLET BY MOUTH EVERY DAY   fluvastatin XL 80 MG 24 hr tablet Commonly known as: LESCOL XL TAKE 1 TABLET BY MOUTH EVERY DAY   FREESTYLE LITE test strip Generic drug: glucose blood USE AS INSTRUCTED TWICE A DAY   Insulin Pen Needle 32G X 6 MM Misc Commonly known as: NovoFine Use 1 per day to inject Victoza   Insulin Pen Needle 32G X 6 MM Misc Commonly known as: NovoFine USE 1 PER DAY TO INJECT VICTOZA   BD Pen Needle Nano U/F 32G X 4 MM  Misc Generic drug: Insulin Pen Needle USE 1 PER DAY TO INJECT VICTOZA   lisinopril 40 MG tablet Commonly known as: ZESTRIL TAKE 1 TABLET BY MOUTH EVERY DAY   methocarbamol 500 MG tablet Commonly known as: ROBAXIN TAKE 1 TABLET BY MOUTH EVERY 8 HOURS AS NEEDED FOR MUSCLE SPASMS.   Mobic 15 MG tablet Generic drug: meloxicam Mobic 15 mg tablet  Take 1 tablet every day by oral route as needed for 30 days.  take with meals   Ozempic (1 MG/DOSE) 4 MG/3ML Sopn Generic drug: Semaglutide (1 MG/DOSE) INJECT 1 MG INTO THE SKIN ONE TIME PER WEEK   SUMAtriptan 100 MG tablet Commonly known as: Imitrex Take 1 tablet (100 mg total) by mouth every 2 (two) hours as needed for migraine. May repeat in 2 hours if headache persists or recurs.   tadalafil 20 MG tablet Commonly known as: Cialis TAKE 1 TABLET EVERY DAY AS NEEDED FOR ERECTILE DYSFUNCTION   Xigduo XR 11-998 MG Tb24 Generic drug: Dapagliflozin Pro-metFORMIN ER TAKE 2 TABLETS BY MOUTH EVERY DAY        Allergies: No Known Allergies  Past Medical History:  Diagnosis Date   Arthritis    knees   Diabetes    GERD (gastroesophageal reflux disease)    years ago, not currently    Hyperlipidemia    Hypertension    Trigeminal neuralgia     Past Surgical History:  Procedure Laterality Date   club foot surgery     age 17 months   KNEE SURGERY     x 2 in high school     Family History  Problem Relation Age of Onset   Diabetes Mother    Diabetes Father    Lymphoma Father    Colon cancer Neg Hx    Colon polyps Neg Hx    Esophageal cancer Neg Hx    Rectal cancer Neg Hx    Stomach cancer Neg Hx     Social History:  reports that he has never smoked. He has never used smokeless tobacco. He reports current alcohol use. He reports that he does not use drugs.    Review of Systems        Lipids: treated with Lipitor 20 mg previously but this was stopped because of leg stiffness; also may have had some side effects of leg  stiffness from Crestor    He has no leg stiffness or aching with Lescol XL, taking 80 mg   Zetia was prescribed in addition and LDL is higher, recently has not refilled his prescription for  unknown reasons and LDL is significantly higher than before Taking Lescol regularly      Lab Results  Component Value Date   CHOL 180 11/03/2022   CHOL 174 01/02/2022   CHOL 219 (H) 08/29/2021   Lab Results  Component Value Date   HDL 45.70 11/03/2022   HDL 61.90 01/02/2022   HDL 52.60 08/29/2021   Lab Results  Component Value Date   LDLCALC 92 01/02/2022   LDLCALC 145 (H) 08/29/2021   LDLCALC 118 (H) 04/20/2021   Lab Results  Component Value Date   TRIG 265.0 (H) 11/03/2022   TRIG 100.0 01/02/2022   TRIG 105.0 08/29/2021   Lab Results  Component Value Date   CHOLHDL 4 11/03/2022   CHOLHDL 3 01/02/2022   CHOLHDL 4 08/29/2021   Lab Results  Component Value Date   LDLDIRECT 113.0 11/03/2022   LDLDIRECT 107.0 11/30/2020     HYPERTENSION: Treated with 40 mg lisinopril Diltiazem was added on his last visit in December 2023  Also has microalbuminuria since at least 2018 Microalbumin was 214 as of 6/23 and now down to 81  Blood pressure is better compared to his last visit  BP Readings from Last 3 Encounters:  11/09/22 124/78  07/11/22 132/88  01/26/22 133/84      Complications: Erectile dysfunction. Treated with Cialis 20mg  as needed    Diabetic foot exam in 6/23 showed normal monofilament sensation, Decreased pulses in feet, calluses Rt distal foot   Physical Examination:  BP 124/78 (BP Location: Left Arm, Patient Position: Sitting, Cuff Size: Large)   Pulse 80   Ht 6\' 1"  (1.854 m)   Wt (!) 344 lb (156 kg)   SpO2 95%   BMI 45.39 kg/m   No ankle edema Feet look normal on inspection except for some callus formation on the first toes   ASSESSMENT:  Diabetes type 2, non-insulin-dependent  See history of present illness for discussion of current diabetes  management, blood sugar patterns and problems identified  A1c is higher at 8.8  Current regimen Xigduo and weekly 1.0 mg Ozempic He reportedly has not taken his Ozempic regularly because of lack of supply but not clear why he has not picked up his prescriptions lately He has gained further weight and blood sugars are now over 200 fasting Also has not been doing well with diet and exercise and further weight gain Monitoring has been minimal  HYPERTENSION: On lisinopril 40 mg and low-dose diltiazem Blood pressure control appears adequate with the combination  Microalbuminuria: This is persisting and likely higher because of poor diabetic control  Lipids: His LDL is higher from not refilling his Zetia Triglycerides higher as expected from high sugars and weight gain   PLAN:   He does need to make sure he takes his Ozempic regularly and he will check to see if this is available at his pharmacy now Also reminded him that he will need to let us know when he is finished with 1 mg dose and he can then go to 2 mg weekly Likely can do better as before with consistent diet and exercise regimen which he was advised to start  He will check blood sugars regularly including after meals Also given Accu-Chek meter and he will let us know when he needs test trips for this, likely this is better covered by the insurance   Follow-up in 2 months   Patient Instructions  After 4 shots call for 2mg  Rx   Reather Littler 11/09/2022, 3:30 PM  Note: This office note was prepared with Insurance underwriter. Any transcriptional errors that result from this process are unintentional.  Flu vaccine given

## 2022-11-09 NOTE — Patient Instructions (Signed)
After 4 shots call for  Rx

## 2023-01-04 ENCOUNTER — Other Ambulatory Visit (INDEPENDENT_AMBULATORY_CARE_PROVIDER_SITE_OTHER): Payer: 59

## 2023-01-04 DIAGNOSIS — E1165 Type 2 diabetes mellitus with hyperglycemia: Secondary | ICD-10-CM | POA: Diagnosis not present

## 2023-01-04 LAB — BASIC METABOLIC PANEL
BUN: 34 mg/dL — ABNORMAL HIGH (ref 6–23)
CO2: 21 mEq/L (ref 19–32)
Calcium: 9.5 mg/dL (ref 8.4–10.5)
Chloride: 103 mEq/L (ref 96–112)
Creatinine, Ser: 1.24 mg/dL (ref 0.40–1.50)
GFR: 65.76 mL/min (ref >60.00)
Glucose, Bld: 369 mg/dL — ABNORMAL HIGH (ref 70–99)
Potassium: 4.7 mEq/L (ref 3.5–5.1)
Sodium: 135 mEq/L (ref 135–145)

## 2023-01-05 LAB — FRUCTOSAMINE: Fructosamine: 311 umol/L — ABNORMAL HIGH (ref 0–285)

## 2023-01-08 ENCOUNTER — Other Ambulatory Visit: Payer: Self-pay | Admitting: Endocrinology

## 2023-01-10 ENCOUNTER — Encounter: Payer: Self-pay | Admitting: Endocrinology

## 2023-01-10 ENCOUNTER — Other Ambulatory Visit: Payer: Self-pay | Admitting: Endocrinology

## 2023-01-10 ENCOUNTER — Ambulatory Visit: Payer: 59 | Admitting: Endocrinology

## 2023-01-10 VITALS — BP 120/76 | HR 90 | Ht 73.0 in | Wt 336.0 lb

## 2023-01-10 DIAGNOSIS — Z794 Long term (current) use of insulin: Secondary | ICD-10-CM | POA: Diagnosis not present

## 2023-01-10 DIAGNOSIS — Z7985 Long-term (current) use of injectable non-insulin antidiabetic drugs: Secondary | ICD-10-CM | POA: Diagnosis not present

## 2023-01-10 DIAGNOSIS — I10 Essential (primary) hypertension: Secondary | ICD-10-CM

## 2023-01-10 DIAGNOSIS — E1165 Type 2 diabetes mellitus with hyperglycemia: Secondary | ICD-10-CM

## 2023-01-10 DIAGNOSIS — E7800 Pure hypercholesterolemia, unspecified: Secondary | ICD-10-CM

## 2023-01-10 MED ORDER — TRESIBA FLEXTOUCH 100 UNIT/ML ~~LOC~~ SOPN: 20.0000 [IU] | PEN_INJECTOR | Freq: Every day | SUBCUTANEOUS | 1 refills | Status: DC

## 2023-01-10 MED ORDER — INSULIN PEN NEEDLE 32G X 6 MM MISC
5 refills | Status: DC
Start: 2023-01-10 — End: 2024-04-22

## 2023-01-10 MED ORDER — OZEMPIC (2 MG/DOSE) 8 MG/3ML ~~LOC~~ SOPN: PEN_INJECTOR | SUBCUTANEOUS | 1 refills | Status: DC

## 2023-01-10 NOTE — Progress Notes (Unsigned)
Patient ID: Gregory Fitzgerald, male   DOB: 20-Jul-1966, 57 y.o.   MRN: [id number]     Reason for Appointment: Endocrinology follow-up  History of Present Illness:          Diagnosis: Type 2 diabetes mellitus, date of diagnosis: 2006        Past history: At diagnosis he was not having any symptoms and glucose was discovered incidentally when he was found to be hypertensive He had been on metformin for several years and at some point was also taking Amaryl His blood sugars had been significantly high since about 2013 with A1c around 10% On his initial consultation he was taking 1.8 mg Victoza along with metformin ER 1500 mg daily Baseline A1c was 9.7% Because of his obesity he was started on Invokana 100 mg for better control; Amaryl 2 mg was added because of fasting hyperglycemia A1c had come down to 6.6 in October 2015  Recent history:   Non-insulin hypoglycemic drugs: Xigduo 11/998 mg, 2 tablets daily, Ozempic 1 mg weekly   A1c is 8.8 compared to 6.2   Current blood sugar patterns and management: Has had a marked increase in his blood sugars lately He ascribes this to not getting his Ozempic available at the pharmacy although refill history on his medication list indicates he did have a prescription last month He says he has not taken Ozempic for several weeks With this he has not been watching his diet and eating more carbohydrates Also because of not completely recovering from back pain he has not exercise Exer 4/7 Home blood sugars fasting about 228 and midday 157, 184 Taking Xigduo regularly recently Has had 3 steroid injections in 2023 but not recently  He works 3rd shift  Dinner usually around 4 pm which is his main meal  Side effects from medications have been: None  GLUCOSE MONITORING: With FreeStyle meter, readings as above Previously:  AVERAGE 109 for 30 days Blood sugar range 91-123 with most readings fasting or before lunch or dinner   Dietician  visit: Most recent: 2008 .   Wt Readings from Last 3 Encounters:  01/10/23 (!) 336 lb (152.4 kg)  11/09/22 (!) 344 lb (156 kg)  07/11/22 (!) 329 lb (149.2 kg)      Lab Results  Component Value Date   HGBA1C 8.8 (H) 11/03/2022   HGBA1C 6.2 (A) 07/11/2022   HGBA1C 5.7 01/02/2022   Lab Results  Component Value Date   MICROALBUR 66.5 (H) 11/03/2022   LDLCALC 92 01/02/2022   CREATININE 1.24 01/04/2023       OTHER active problems are discussed in review of systems               Allergies as of 01/10/2023   No Known Allergies      Medication List        Accurate as of January 10, 2023 10:46 AM. If you have any questions, ask your nurse or doctor.          Accu-Chek Aviva Plus w/Device Kit Use to check blood sugar twice a daily.   aspirin 81 MG tablet Take 81 mg by mouth daily.   blood glucose meter kit and supplies Kit Dispense based on patient and insurance preference. Use up to two times daily as directed.   carbamazepine 200 MG tablet Commonly known as: TEGRETOL TAKE 1 TABLET BY MOUTH TWICE A DAY   clotrimazole-betamethasone cream Commonly known as: LOTRISONE APPLY TO AFFECTED AREA TWICE A DAY  diltiazem 120 MG 24 hr capsule Commonly known as: CARDIZEM CD TAKE 1 CAPSULE BY MOUTH EVERY DAY   ezetimibe 10 MG tablet Commonly known as: ZETIA TAKE 1 TABLET BY MOUTH EVERY DAY   fluvastatin XL 80 MG 24 hr tablet Commonly known as: LESCOL XL TAKE 1 TABLET BY MOUTH EVERY DAY   FREESTYLE LITE test strip Generic drug: glucose blood USE AS INSTRUCTED TWICE A DAY   Insulin Pen Needle 32G X 6 MM Misc Commonly known as: NovoFine Use 1 per day to inject Victoza   Insulin Pen Needle 32G X 6 MM Misc Commonly known as: NovoFine USE 1 PER DAY TO INJECT VICTOZA   BD Pen Needle Nano U/F 32G X 4 MM Misc Generic drug: Insulin Pen Needle USE 1 PER DAY TO INJECT VICTOZA   lisinopril 40 MG tablet Commonly known as: ZESTRIL TAKE 1 TABLET BY MOUTH EVERY DAY    methocarbamol 500 MG tablet Commonly known as: ROBAXIN TAKE 1 TABLET BY MOUTH EVERY 8 HOURS AS NEEDED FOR MUSCLE SPASMS.   Mobic 15 MG tablet Generic drug: meloxicam Mobic 15 mg tablet  Take 1 tablet every day by oral route as needed for 30 days.  take with meals   Ozempic (1 MG/DOSE) 4 MG/3ML Sopn Generic drug: Semaglutide (1 MG/DOSE) INJECT 1 MG INTO THE SKIN ONE TIME PER WEEK   SUMAtriptan 100 MG tablet Commonly known as: Imitrex Take 1 tablet (100 mg total) by mouth every 2 (two) hours as needed for migraine. May repeat in 2 hours if headache persists or recurs.   tadalafil 20 MG tablet Commonly known as: Cialis TAKE 1 TABLET EVERY DAY AS NEEDED FOR ERECTILE DYSFUNCTION   Xigduo XR 11-998 MG Tb24 Generic drug: Dapagliflozin Pro-metFORMIN ER TAKE 2 TABLETS BY MOUTH EVERY DAY        Allergies: No Known Allergies  Past Medical History:  Diagnosis Date   Arthritis    knees   Diabetes (HCC)    GERD (gastroesophageal reflux disease)    years ago, not currently    Hyperlipidemia    Hypertension    Trigeminal neuralgia     Past Surgical History:  Procedure Laterality Date   club foot surgery     age 43 months   KNEE SURGERY     x 2 in high school     Family History  Problem Relation Age of Onset   Diabetes Mother    Diabetes Father    Lymphoma Father    Colon cancer Neg Hx    Colon polyps Neg Hx    Esophageal cancer Neg Hx    Rectal cancer Neg Hx    Stomach cancer Neg Hx     Social History:  reports that he has never smoked. He has never used smokeless tobacco. He reports current alcohol use. He reports that he does not use drugs.    Review of Systems        Lipids: treated with Lipitor 20 mg previously but this was stopped because of leg stiffness; also may have had some side effects of leg stiffness from Crestor    He has no leg stiffness or aching with Lescol XL, taking 80 mg   Zetia was prescribed in addition and LDL is higher, recently has  not refilled his prescription for unknown reasons and LDL is significantly higher than before Taking Lescol regularly      Lab Results  Component Value Date   CHOL 180 11/03/2022   CHOL 174  01/02/2022   CHOL 219 (H) 08/29/2021   Lab Results  Component Value Date   HDL 45.70 11/03/2022   HDL 61.90 01/02/2022   HDL 52.60 08/29/2021   Lab Results  Component Value Date   LDLCALC 92 01/02/2022   LDLCALC 145 (H) 08/29/2021   LDLCALC 118 (H) 04/20/2021   Lab Results  Component Value Date   TRIG 265.0 (H) 11/03/2022   TRIG 100.0 01/02/2022   TRIG 105.0 08/29/2021   Lab Results  Component Value Date   CHOLHDL 4 11/03/2022   CHOLHDL 3 01/02/2022   CHOLHDL 4 08/29/2021   Lab Results  Component Value Date   LDLDIRECT 113.0 11/03/2022   LDLDIRECT 107.0 11/30/2020     HYPERTENSION: Treated with 40 mg lisinopril Diltiazem was added on his last visit in December 2023  Also has microalbuminuria since at least 2018 Microalbumin was 214 as of 6/23 and now down to 81  Blood pressure is better compared to his last visit  BP Readings from Last 3 Encounters:  01/10/23 120/76  11/09/22 124/78  07/11/22 132/88      Complications: Erectile dysfunction. Treated with Cialis 20mg  as needed    Diabetic foot exam in 6/23 showed normal monofilament sensation, Decreased pulses in feet, calluses Rt distal foot   Physical Examination:  BP 120/76 (BP Location: Left Arm, Patient Position: Sitting, Cuff Size: Large)   Pulse 90   Ht 6\' 1"  (1.854 m)   Wt (!) 336 lb (152.4 kg)   SpO2 95%   BMI 44.33 kg/m   Diabetic Foot Exam - Simple   Simple Foot Form Diabetic Foot exam was performed with the following findings: Yes   Visual Inspection No deformities, no ulcerations, no other skin breakdown bilaterally: Yes See comments: Yes Sensation Testing Intact to touch and monofilament testing bilaterally: Yes Pulse Check See comments: Yes Comments Left pedal pulses not palpated Mild  onychomycosis of first 2 toes Mild callus formation on the first toes       ASSESSMENT:  Diabetes type 2, non-insulin-dependent  See history of present illness for discussion of current diabetes management, blood sugar patterns and problems identified  A1c is higher at 8.8  Current regimen Xigduo and weekly 1.0 mg Ozempic He reportedly has not taken his Ozempic regularly because of lack of supply but not clear why he has not picked up his prescriptions lately He has gained further weight and blood sugars are now over 200 fasting Also has not been doing well with diet and exercise and further weight gain Monitoring has been minimal  HYPERTENSION: On lisinopril 40 mg and low-dose diltiazem Blood pressure control appears adequate with the combination  Microalbuminuria: This is persisting and likely higher because of poor diabetic control  Lipids: His LDL is higher from not refilling his Zetia Triglycerides higher as expected from high sugars and weight gain   PLAN:   Discussed how basal insulin works, timing of injection, dosage, injection sites.  Also discussed titration based on fasting blood sugar every 3 days by 2 units and at target of 90-130 for fasting reading.  Given a flowsheet with instructions on how to keep a record and adjust the doses  Co-pay card and brochure on the insulin given   He does need to make sure he takes his Ozempic regularly and he will check to see if this is available at his pharmacy now Also reminded him that he will need to let us know when he is finished with 1 mg dose  and he can then go to 2 mg weekly Likely can do better as before with consistent diet and exercise regimen which he was advised to start  He will check blood sugars regularly including after meals Also given Accu-Chek meter and he will let us know when he needs test trips for this, likely this is better covered by the insurance   Follow-up in 2 months   There are no Patient  Instructions on file for this visit.  Reather Littler 01/10/2023, 10:46 AM   Note: This office note was prepared with Dragon voice recognition system technology. Any transcriptional errors that result from this process are unintentional.  Flu vaccine given

## 2023-01-11 ENCOUNTER — Other Ambulatory Visit: Payer: Self-pay | Admitting: Endocrinology

## 2023-01-11 ENCOUNTER — Telehealth: Payer: Self-pay

## 2023-01-11 NOTE — Telephone Encounter (Deleted)
Patient states that Levemir was switched due to insurance. He has about a month left of the Toujeo. Would you like me to send. I am transferring up front to be scheduled.

## 2023-01-11 NOTE — Telephone Encounter (Signed)
Per incoming request from pharmacy Evaristo Bury not covered but Lantus is covered.

## 2023-01-11 NOTE — Telephone Encounter (Signed)
Message sent to PA team to see if Nathen May is covered

## 2023-01-12 ENCOUNTER — Other Ambulatory Visit: Payer: Self-pay | Admitting: Endocrinology

## 2023-01-12 MED ORDER — LANTUS SOLOSTAR 100 UNIT/ML ~~LOC~~ SOPN: PEN_INJECTOR | SUBCUTANEOUS | 0 refills | Status: DC

## 2023-01-15 NOTE — Telephone Encounter (Signed)
Patient was advised and will pick up the Lantus

## 2023-01-29 ENCOUNTER — Other Ambulatory Visit (HOSPITAL_COMMUNITY): Payer: Self-pay

## 2023-01-29 NOTE — Telephone Encounter (Signed)
Per test claim: both Toujeo and Lantus are covered. Both have a copay of $9

## 2023-02-01 ENCOUNTER — Other Ambulatory Visit: Payer: Self-pay | Admitting: Endocrinology

## 2023-02-07 ENCOUNTER — Other Ambulatory Visit (INDEPENDENT_AMBULATORY_CARE_PROVIDER_SITE_OTHER): Payer: 59

## 2023-02-07 DIAGNOSIS — E1165 Type 2 diabetes mellitus with hyperglycemia: Secondary | ICD-10-CM | POA: Diagnosis not present

## 2023-02-07 DIAGNOSIS — E7800 Pure hypercholesterolemia, unspecified: Secondary | ICD-10-CM | POA: Diagnosis not present

## 2023-02-07 LAB — COMPREHENSIVE METABOLIC PANEL
ALT: 10 U/L (ref 0–53)
AST: 14 U/L (ref 0–37)
Albumin: 4.2 g/dL (ref 3.5–5.2)
Alkaline Phosphatase: 54 U/L (ref 39–117)
BUN: 20 mg/dL (ref 6–23)
CO2: 25 mEq/L (ref 19–32)
Calcium: 10 mg/dL (ref 8.4–10.5)
Chloride: 108 mEq/L (ref 96–112)
Creatinine, Ser: 1.07 mg/dL (ref 0.40–1.50)
GFR: 78.43 mL/min (ref >60.00)
Glucose, Bld: 116 mg/dL — ABNORMAL HIGH (ref 70–99)
Potassium: 4 mEq/L (ref 3.5–5.1)
Sodium: 141 mEq/L (ref 135–145)
Total Bilirubin: 0.7 mg/dL (ref 0.2–1.2)
Total Protein: 7.1 g/dL (ref 6.0–8.3)

## 2023-02-07 LAB — MICROALBUMIN / CREATININE URINE RATIO
Creatinine,U: 92.8 mg/dL
Microalb Creat Ratio: 94.3 mg/g — ABNORMAL HIGH (ref 0.0–30.0)
Microalb, Ur: 87.5 mg/dL — ABNORMAL HIGH (ref 0.0–1.9)

## 2023-02-07 LAB — LIPID PANEL
Cholesterol: 150 mg/dL (ref 0–200)
HDL: 44.1 mg/dL (ref >39.00)
LDL Cholesterol: 82 mg/dL (ref 0–99)
NonHDL: 106.3
Total CHOL/HDL Ratio: 3
Triglycerides: 124 mg/dL (ref 0.0–149.0)
VLDL: 24.8 mg/dL (ref 0.0–40.0)

## 2023-02-07 LAB — HEMOGLOBIN A1C: Hgb A1c MFr Bld: 7 % — ABNORMAL HIGH (ref 4.6–6.5)

## 2023-02-14 ENCOUNTER — Encounter: Payer: Self-pay | Admitting: Endocrinology

## 2023-02-14 ENCOUNTER — Ambulatory Visit: Payer: 59 | Admitting: Endocrinology

## 2023-02-14 VITALS — BP 130/80 | HR 86 | Ht 73.0 in | Wt 337.4 lb

## 2023-02-14 DIAGNOSIS — I10 Essential (primary) hypertension: Secondary | ICD-10-CM

## 2023-02-14 DIAGNOSIS — E1129 Type 2 diabetes mellitus with other diabetic kidney complication: Secondary | ICD-10-CM | POA: Diagnosis not present

## 2023-02-14 DIAGNOSIS — E1165 Type 2 diabetes mellitus with hyperglycemia: Secondary | ICD-10-CM | POA: Diagnosis not present

## 2023-02-14 DIAGNOSIS — R809 Proteinuria, unspecified: Secondary | ICD-10-CM | POA: Diagnosis not present

## 2023-02-14 DIAGNOSIS — Z7984 Long term (current) use of oral hypoglycemic drugs: Secondary | ICD-10-CM

## 2023-02-14 MED ORDER — OZEMPIC (2 MG/DOSE) 8 MG/3ML ~~LOC~~ SOPN: PEN_INJECTOR | SUBCUTANEOUS | 1 refills | Status: DC

## 2023-02-14 NOTE — Patient Instructions (Signed)
Eye exam yearly!

## 2023-02-14 NOTE — Progress Notes (Signed)
Patient ID: Gregory Fitzgerald, male   DOB: [birth year], 57 y.o.   MRN: [id number]     Reason for Appointment: Endocrinology follow-up  History of Present Illness:          Diagnosis: Type 2 diabetes mellitus, date of diagnosis: 2006        Past history: At diagnosis he was not having any symptoms and glucose was discovered incidentally when he was found to be hypertensive He had been on metformin for several years and at some point was also taking Amaryl His blood sugars had been significantly high since about 2013 with A1c around 10% On his initial consultation he was taking 1.8 mg Victoza along with metformin ER 1500 mg daily Baseline A1c was 9.7% Because of his obesity he was started on Invokana 100 mg for better control; Amaryl 2 mg was added because of fasting hyperglycemia A1c had come down to 6.6 in October 2015  Recent history:   Non-insulin hypoglycemic drugs: Xigduo 11/998 mg, 2 tablets daily, Ozempic 1 mg weekly   A1c is 7 compared to 8.8  A1c has been as low as 6.2 previously   Current blood sugar patterns and management: On his last visit he had continued difficulties with hyperglycemia despite going back on 1 mg Ozempic Because of blood sugars as high as 369 he was recommended starting basal insulin in addition However he was apprehensive about this and did not start the insulin but improved his diet He says he has cut back his carbohydrate intake significantly especially rice and bread Also he is exercising regularly Although he has not lost any weight his blood sugars recently at home are near normal Not checking any readings after meals however He was supposed to go up to 2 mg Ozempic but pharmacist gave him only the 1 mg dose  He works 3rd shift  Dinner usually around 4 pm which is his main meal  Side effects from medications have been: None  GLUCOSE MONITORING: With FreeStyle meter,    PRE-MEAL Fasting Lunch Dinner Bedtime Overall  Glucose  range: 87-100  79-104 111   Mean/median:         Dietician visit: Most recent: 2008 .   Wt Readings from Last 3 Encounters:  02/14/23 (!) 337 lb 6.4 oz (153 kg)  01/10/23 (!) 336 lb (152.4 kg)  11/09/22 (!) 344 lb (156 kg)      Lab Results  Component Value Date   HGBA1C 7.0 (H) 02/07/2023   HGBA1C 8.8 (H) 11/03/2022   HGBA1C 6.2 (A) 07/11/2022   Lab Results  Component Value Date   MICROALBUR 87.5 (H) 02/07/2023   LDLCALC 82 02/07/2023   CREATININE 1.07 02/07/2023       OTHER active problems are discussed in review of systems               Allergies as of 02/14/2023   No Known Allergies      Medication List        Accurate as of February 14, 2023 10:03 AM. If you have any questions, ask your nurse or doctor.          Accu-Chek Aviva Plus w/Device Kit Use to check blood sugar twice a daily.   aspirin 81 MG tablet Take 81 mg by mouth daily.   blood glucose meter kit and supplies Kit Dispense based on patient and insurance preference. Use up to two times daily as directed.   carbamazepine 200 MG tablet Commonly known as:  TEGRETOL TAKE 1 TABLET BY MOUTH TWICE A DAY   clotrimazole-betamethasone cream Commonly known as: LOTRISONE APPLY TO AFFECTED AREA TWICE A DAY   diltiazem 120 MG 24 hr capsule Commonly known as: CARDIZEM CD TAKE 1 CAPSULE BY MOUTH EVERY DAY   ezetimibe 10 MG tablet Commonly known as: ZETIA TAKE 1 TABLET BY MOUTH EVERY DAY   fluvastatin XL 80 MG 24 hr tablet Commonly known as: LESCOL XL TAKE 1 TABLET BY MOUTH EVERY DAY   FREESTYLE LITE test strip Generic drug: glucose blood USE AS INSTRUCTED TWICE A DAY   Insulin Pen Needle 32G X 6 MM Misc Commonly known as: NovoFine USE 1 PER DAY TO INJECT VICTOZA   Insulin Pen Needle 32G X 6 MM Misc Commonly known as: NovoFine Use 1 per day to inject insulin   Lantus SoloStar 100 UNIT/ML Solostar Pen Generic drug: insulin glargine Take once a day in the evening up to 18 units as  directed   lisinopril 40 MG tablet Commonly known as: ZESTRIL TAKE 1 TABLET BY MOUTH EVERY DAY   methocarbamol 500 MG tablet Commonly known as: ROBAXIN TAKE 1 TABLET BY MOUTH EVERY 8 HOURS AS NEEDED FOR MUSCLE SPASMS.   Mobic 15 MG tablet Generic drug: meloxicam Mobic 15 mg tablet  Take 1 tablet every day by oral route as needed for 30 days.  take with meals   Ozempic (2 MG/DOSE) 8 MG/3ML Sopn Generic drug: Semaglutide (2 MG/DOSE) Inject 2 mg weekly   SUMAtriptan 100 MG tablet Commonly known as: Imitrex Take 1 tablet (100 mg total) by mouth every 2 (two) hours as needed for migraine. May repeat in 2 hours if headache persists or recurs.   tadalafil 20 MG tablet Commonly known as: Cialis TAKE 1 TABLET EVERY DAY AS NEEDED FOR ERECTILE DYSFUNCTION   Xigduo XR 11-998 MG Tb24 Generic drug: Dapagliflozin Pro-metFORMIN ER TAKE 2 TABLETS BY MOUTH EVERY DAY        Allergies: No Known Allergies  Past Medical History:  Diagnosis Date   Arthritis    knees   Diabetes (HCC)    GERD (gastroesophageal reflux disease)    years ago, not currently    Hyperlipidemia    Hypertension    Trigeminal neuralgia     Past Surgical History:  Procedure Laterality Date   club foot surgery     age 5 months   KNEE SURGERY     x 2 in high school     Family History  Problem Relation Age of Onset   Diabetes Mother    Diabetes Father    Lymphoma Father    Colon cancer Neg Hx    Colon polyps Neg Hx    Esophageal cancer Neg Hx    Rectal cancer Neg Hx    Stomach cancer Neg Hx     Social History:  reports that he has never smoked. He has never used smokeless tobacco. He reports current alcohol use. He reports that he does not use drugs.    Review of Systems        Lipids: treated with Lipitor 20 mg previously but this was stopped because of leg stiffness; also may have had some side effects of leg stiffness from Crestor    He has no leg stiffness or aching with Lescol XL, taking  80 mg   Also taking Zetia       Lab Results  Component Value Date   CHOL 150 02/07/2023   CHOL 180 11/03/2022  CHOL 174 01/02/2022   Lab Results  Component Value Date   HDL 44.10 02/07/2023   HDL 45.70 11/03/2022   HDL 61.90 01/02/2022   Lab Results  Component Value Date   LDLCALC 82 02/07/2023   LDLCALC 92 01/02/2022   LDLCALC 145 (H) 08/29/2021   Lab Results  Component Value Date   TRIG 124.0 02/07/2023   TRIG 265.0 (H) 11/03/2022   TRIG 100.0 01/02/2022   Lab Results  Component Value Date   CHOLHDL 3 02/07/2023   CHOLHDL 4 11/03/2022   CHOLHDL 3 01/02/2022   Lab Results  Component Value Date   LDLDIRECT 113.0 11/03/2022   LDLDIRECT 107.0 11/30/2020     HYPERTENSION: Treated with 40 mg lisinopril Diltiazem 120 was added on his visit in December 2023 partly because of microalbuminuria  Also has microalbuminuria since at least 2018 Microalbumin last down to 87   BP Readings from Last 3 Encounters:  02/14/23 130/80  01/10/23 120/76  11/09/22 124/78     Complications: Erectile dysfunction. Treated with Cialis 20mg  as needed    Diabetic foot exam in 6/24 showed normal monofilament sensation, Decreased pulses in feet, calluses right distal foot   Physical Examination:  BP 130/80   Pulse 86   Ht 6\' 1"  (1.854 m)   Wt (!) 337 lb 6.4 oz (153 kg)   SpO2 92%   BMI 44.51 kg/m     ASSESSMENT:  Diabetes type 2, non-insulin-dependent  See history of present illness for discussion of current diabetes management, blood sugar patterns and problems identified  A1c is 7 compared to 8.8  Current regimen Xigduo and weekly 1.0 mg Ozempic  His blood sugars appear to be excellent more recently despite not starting basal insulin as recommended He is significantly improving his diet and continuing exercise with near normal blood sugars at home and subjectively is also feeling better  HYPERTENSION: Controlled on lisinopril 40 mg and low-dose  diltiazem  Microalbuminuria: This is persistent although stable now, already on ARB drugs, diltiazem and Farxiga May improve further with continued improvement in diabetes control  Hyperlipidemia: Will need follow-up in the next visit   PLAN:   No change in basic treatment plan  Increase Ozempic to 2 mg weekly  Start to check blood sugars after meals at least half the time Continue low carbohydrate diet but make sure he has balanced meals and avoids skipping meals  Follow-up in 3 months   There are no Patient Instructions on file for this visit.  Reather Littler 02/14/2023, 10:03 AM   Note: This office note was prepared with Dragon voice recognition system technology. Any transcriptional errors that result from this process are unintentional.

## 2023-02-25 ENCOUNTER — Other Ambulatory Visit: Payer: Self-pay | Admitting: Endocrinology

## 2023-02-28 ENCOUNTER — Telehealth: Payer: Self-pay | Admitting: Endocrinology

## 2023-02-28 NOTE — Telephone Encounter (Signed)
Patient is interested in surgery please advise, I did send him a message letting him know he would need to contact his insurance about coverage.

## 2023-02-28 NOTE — Telephone Encounter (Signed)
Patient is calling to find out more information concerning weight loss surgery.  Patient states that he is definitely interested and wants to know about insurance coverage and how to start the process.

## 2023-05-11 ENCOUNTER — Ambulatory Visit: Payer: 59 | Admitting: Endocrinology

## 2023-06-08 ENCOUNTER — Other Ambulatory Visit (INDEPENDENT_AMBULATORY_CARE_PROVIDER_SITE_OTHER): Payer: 59

## 2023-06-08 DIAGNOSIS — I10 Essential (primary) hypertension: Secondary | ICD-10-CM | POA: Diagnosis not present

## 2023-06-08 LAB — BASIC METABOLIC PANEL
BUN: 18 mg/dL (ref 6–23)
CO2: 23 meq/L (ref 19–32)
Calcium: 9.1 mg/dL (ref 8.4–10.5)
Chloride: 104 meq/L (ref 96–112)
Creatinine, Ser: 1.07 mg/dL (ref 0.40–1.50)
GFR: 78.25 mL/min (ref >60.00)
Glucose, Bld: 90 mg/dL (ref 70–99)
Potassium: 3.9 meq/L (ref 3.5–5.1)
Sodium: 138 meq/L (ref 135–145)

## 2023-06-08 LAB — HEMOGLOBIN A1C: Hgb A1c MFr Bld: 6.6 % — ABNORMAL HIGH (ref 4.6–6.5)

## 2023-06-14 ENCOUNTER — Encounter: Payer: Self-pay | Admitting: Endocrinology

## 2023-06-14 ENCOUNTER — Ambulatory Visit: Payer: 59 | Admitting: Endocrinology

## 2023-06-14 VITALS — BP 138/80 | HR 79 | Resp 20 | Ht 73.0 in | Wt 331.4 lb

## 2023-06-14 DIAGNOSIS — Z7984 Long term (current) use of oral hypoglycemic drugs: Secondary | ICD-10-CM

## 2023-06-14 DIAGNOSIS — Z7985 Long-term (current) use of injectable non-insulin antidiabetic drugs: Secondary | ICD-10-CM

## 2023-06-14 DIAGNOSIS — E118 Type 2 diabetes mellitus with unspecified complications: Secondary | ICD-10-CM | POA: Diagnosis not present

## 2023-06-14 DIAGNOSIS — Z23 Encounter for immunization: Secondary | ICD-10-CM | POA: Diagnosis not present

## 2023-06-14 MED ORDER — DAPAGLIFLOZIN PRO-METFORMIN ER 5-1000 MG PO TB24: 2.0000 | ORAL_TABLET | Freq: Every day | ORAL | 3 refills | Status: DC

## 2023-06-14 MED ORDER — OZEMPIC (2 MG/DOSE) 8 MG/3ML ~~LOC~~ SOPN: PEN_INJECTOR | SUBCUTANEOUS | 3 refills | Status: DC

## 2023-06-14 NOTE — Progress Notes (Signed)
Outpatient Endocrinology Note Gregory Luiza Carranco, MD  06/14/23  Patient's Name: Gregory Fitzgerald    DOB: 10-Jul-1966    MRN: [id number]                                                    REASON OF VISIT: Follow up for type 2 diabetes mellitus  PCP: Sheliah Hatch, MD  HISTORY OF PRESENT ILLNESS:   Gregory Fitzgerald is a 57 y.o. old male with past medical history listed below, is here for follow up for type 2 diabetes mellitus.  Patient was last seen by Dr. Lucianne Muss in July 2024.  Pertinent Diabetes History: Patient was diagnosed with type 2 diabetes mellitus in 2006.  No personal history of pancreatitis and / or family history of medullary thyroid carcinoma or MEN 2B syndrome.   Chronic Diabetes Complications : Retinopathy: no. Last ophthalmology exam was done on annually, following with ophthalmology regularly.  Nephropathy: Microalbuminuria present, on ACE/ARB /lisinopril and dapagliflozin. Peripheral neuropathy: no Coronary artery disease: no Stroke: no  Relevant comorbidities and cardiovascular risk factors: Obesity: yes Body mass index is 43.72 kg/m.  Hypertension: Yes  Hyperlipidemia : Yes, on statin   Treated with Lipitor 20 mg previously but this was stopped because of leg stiffness; also may have had some side effects of leg stiffness from Crestor    He has no leg stiffness or aching with Lescol XL, taking 80 mg   Current / Home Diabetic regimen includes: Xigduo XR 11/998 mg, 2 tablets daily,  Ozempic 2 mg weekly   Prior diabetic medications: He used to be on glimepiride, Victoza, Invokana in the past.  There was plan to start basal insulin in June 2024 however he never restarted.  Glycemic data:   He has been using freestyle libre glucometer.  Average blood sugar in last 2 weeks 108.  He has been mostly 3 in the morning fasting and sometime in the afternoon.  Fasting blood sugar in the range of 90 to low 100.  Some of the blood sugar 98, 97, 90, 108, 115, 129, 164, 100.   Blood sugar in the afternoon 106, 96, 77, 99, 108.  No hypoglycemia.  All acceptable blood sugars.  Hypoglycemia: Patient has no hypoglycemic episodes. Patient has hypoglycemia awareness.  Factors modifying glucose control: 1.  Diabetic diet assessment: 3 meals a day.  2.  Staying active or exercising: Exercising in the gym 4 days a week.  3.  Medication compliance: compliant all of the time.  Interval history  Glucometer data as reviewed above.  He has been on Ozempic 2 mg weekly and tolerating well, denies any GI issues.  Hemoglobin A1c improved to 6.6%.  No other complaints today.  He wants flu vaccine in the clinic today.  He lost about 6 pounds of weight after being on higher dose of Ozempic.  REVIEW OF SYSTEMS As per history of present illness.   PAST MEDICAL HISTORY: Past Medical History:  Diagnosis Date   Arthritis    knees   Diabetes (HCC)    GERD (gastroesophageal reflux disease)    years ago, not currently    Hyperlipidemia    Hypertension    Trigeminal neuralgia     PAST SURGICAL HISTORY: Past Surgical History:  Procedure Laterality Date   club foot surgery     age 64  months   KNEE SURGERY     x 2 in high school     ALLERGIES: No Known Allergies  FAMILY HISTORY:  Family History  Problem Relation Age of Onset   Diabetes Mother    Diabetes Father    Lymphoma Father    Colon cancer Neg Hx    Colon polyps Neg Hx    Esophageal cancer Neg Hx    Rectal cancer Neg Hx    Stomach cancer Neg Hx     SOCIAL HISTORY: Social History   Socioeconomic History   Marital status: Married    Spouse name: Not on file   Number of children: Not on file   Years of education: Not on file   Highest education level: Not on file  Occupational History   Not on file  Tobacco Use   Smoking status: Never   Smokeless tobacco: Never  Substance and Sexual Activity   Alcohol use: Yes    Comment: occasionally    Drug use: Never   Sexual activity: Not on file  Other  Topics Concern   Not on file  Social History Narrative   Not on file   Social Determinants of Health   Financial Resource Strain: Not on file  Food Insecurity: Not on file  Transportation Needs: Not on file  Physical Activity: Not on file  Stress: Not on file  Social Connections: Not on file    MEDICATIONS:  Current Outpatient Medications  Medication Sig Dispense Refill   aspirin 81 MG tablet Take 81 mg by mouth daily.     blood glucose meter kit and supplies KIT Dispense based on patient and insurance preference. Use up to two times daily as directed. 1 each 0   Blood Glucose Monitoring Suppl (ACCU-CHEK AVIVA PLUS) w/Device KIT Use to check blood sugar twice a daily. 1 kit 0   carbamazepine (TEGRETOL) 200 MG tablet TAKE 1 TABLET BY MOUTH TWICE A DAY 180 tablet 1   clotrimazole-betamethasone (LOTRISONE) cream APPLY TO AFFECTED AREA TWICE A DAY 45 g 0   diltiazem (CARDIZEM CD) 120 MG 24 hr capsule TAKE 1 CAPSULE BY MOUTH EVERY DAY 90 capsule 1   ezetimibe (ZETIA) 10 MG tablet TAKE 1 TABLET BY MOUTH EVERY DAY 90 tablet 1   fluvastatin XL (LESCOL XL) 80 MG 24 hr tablet TAKE 1 TABLET BY MOUTH EVERY DAY 90 tablet 1   glucose blood (FREESTYLE LITE) test strip USE AS INSTRUCTED TWICE A DAY 100 strip 3   Insulin Pen Needle (NOVOFINE) 32G X 6 MM MISC Use 1 per day to inject insulin 50 each 5   lisinopril (ZESTRIL) 40 MG tablet TAKE 1 TABLET BY MOUTH EVERY DAY 90 tablet 3   meloxicam (MOBIC) 15 MG tablet Mobic 15 mg tablet  Take 1 tablet every day by oral route as needed for 30 days.  take with meals     methocarbamol (ROBAXIN) 500 MG tablet TAKE 1 TABLET BY MOUTH EVERY 8 HOURS AS NEEDED FOR MUSCLE SPASMS. 45 tablet 1   SUMAtriptan (IMITREX) 100 MG tablet Take 1 tablet (100 mg total) by mouth every 2 (two) hours as needed for migraine. May repeat in 2 hours if headache persists or recurs. 12 tablet 11   tadalafil (CIALIS) 20 MG tablet TAKE 1 TABLET EVERY DAY AS NEEDED FOR ERECTILE DYSFUNCTION  10 tablet 2   Dapagliflozin Pro-metFORMIN ER (XIGDUO XR) 11-998 MG TB24 Take 2 tablets by mouth daily. 180 tablet 3   Semaglutide, 2 MG/DOSE, (  OZEMPIC, 2 MG/DOSE,) 8 MG/3ML SOPN Inject 2 mg weekly 9 mL 3   No current facility-administered medications for this visit.    PHYSICAL EXAM: Vitals:   06/14/23 1610  BP: 138/80  Pulse: 79  Resp: 20  SpO2: 96%  Weight: (!) 331 lb 6.4 oz (150.3 kg)  Height: 6\' 1"  (1.854 m)   Body mass index is 43.72 kg/m.  Wt Readings from Last 3 Encounters:  06/14/23 (!) 331 lb 6.4 oz (150.3 kg)  02/14/23 (!) 337 lb 6.4 oz (153 kg)  01/10/23 (!) 336 lb (152.4 kg)    General: Well developed, well nourished male in no apparent distress.  HEENT: AT/[place], no external lesions.  Eyes: Conjunctiva clear and no icterus. Neck: Neck supple  Lungs: Respirations not labored Neurologic: Alert, oriented, normal speech Extremities / Skin: Dry. No sores or rashes noted.  Psychiatric: Does not appear depressed or anxious  Diabetic Foot Exam - Simple   No data filed    LABS Reviewed Lab Results  Component Value Date   HGBA1C 6.6 (H) 06/08/2023   HGBA1C 7.0 (H) 02/07/2023   HGBA1C 8.8 (H) 11/03/2022   Lab Results  Component Value Date   FRUCTOSAMINE 311 (H) 01/04/2023   FRUCTOSAMINE 403 (H) 03/08/2020   FRUCTOSAMINE 239 12/19/2017   Lab Results  Component Value Date   CHOL 150 02/07/2023   HDL 44.10 02/07/2023   LDLCALC 82 02/07/2023   LDLDIRECT 113.0 11/03/2022   TRIG 124.0 02/07/2023   CHOLHDL 3 02/07/2023   Lab Results  Component Value Date   MICRALBCREAT 94.3 (H) 02/07/2023   MICRALBCREAT 81.4 (H) 11/03/2022   Lab Results  Component Value Date   CREATININE 1.07 06/08/2023   Lab Results  Component Value Date   GFR 78.25 06/08/2023    ASSESSMENT / PLAN  1. Controlled type 2 diabetes mellitus with complication, without long-term current use of insulin (HCC)   2. Encounter for immunization     Diabetes Mellitus type 2, complicated by  microalbuminuria. - Diabetic status / severity: Controlled.  Lab Results  Component Value Date   HGBA1C 6.6 (H) 06/08/2023    - Hemoglobin A1c goal : <7%  - Medications: No change.  I) continue Xigduo XR 11/998 mg 2 tablets daily. II) continue Ozempic 2 mg weekly.  - Home glucose testing: In the morning fasting and occasionally at bedtime. - Discussed/ Gave Hypoglycemia treatment plan.  # Consult : not required at this time.   # Annual urine for microalbuminuria/ creatinine ratio,  microalbuminuria currently, continue ACE/ARB /lisinopril and dapagliflozin.  Microalbuminuria improving, expect to improve with better control of diabetes.  Will check in next visit. Last  Lab Results  Component Value Date   MICRALBCREAT 94.3 (H) 02/07/2023    # Foot check nightly.  # Annual dilated diabetic eye exams.   - Diet: Make healthy diabetic food choices - Life style / activity / exercise: Discussed.  2. Blood pressure  -  BP Readings from Last 1 Encounters:  06/14/23 138/80    - Control is in target.  - No change in current plans.  3. Lipid status / Hyperlipidemia - Last  Lab Results  Component Value Date   LDLCALC 82 02/07/2023   - Continue escol XL, taking 80 mg .  Zetia 10 mg daily.  Diagnoses and all orders for this visit:  Controlled type 2 diabetes mellitus with complication, without long-term current use of insulin (HCC) -     Basic metabolic panel -  Hemoglobin A1c -     Microalbumin / creatinine urine ratio  Encounter for immunization -     Flu vaccine trivalent PF, 6mos and older(Flulaval,Afluria,Fluarix,Fluzone)  Other orders -     Dapagliflozin Pro-metFORMIN ER (XIGDUO XR) 11-998 MG TB24; Take 2 tablets by mouth daily. -     Semaglutide, 2 MG/DOSE, (OZEMPIC, 2 MG/DOSE,) 8 MG/3ML SOPN; Inject 2 mg weekly    DISPOSITION Follow up in clinic in 4  months suggested.   All questions answered and patient verbalized understanding of the plan.  Gregory  Yanna Leaks, MD [facility] Endocrinology [facility] [address] [place], [address] [place], [place] [address] Phone # [phone]  At least part of this note was generated using voice recognition software. Inadvertent word errors may have occurred, which were not recognized during the proofreading process.

## 2023-08-17 ENCOUNTER — Other Ambulatory Visit: Payer: Self-pay | Admitting: Family Medicine

## 2023-08-22 ENCOUNTER — Other Ambulatory Visit: Payer: Self-pay

## 2023-08-22 MED ORDER — LISINOPRIL 40 MG PO TABS: 40.0000 mg | ORAL_TABLET | Freq: Every day | ORAL | 3 refills | Status: DC

## 2023-08-24 ENCOUNTER — Ambulatory Visit (INDEPENDENT_AMBULATORY_CARE_PROVIDER_SITE_OTHER): Payer: 59 | Admitting: Family Medicine

## 2023-08-24 ENCOUNTER — Encounter: Payer: Self-pay | Admitting: Family Medicine

## 2023-08-24 VITALS — BP 128/72 | HR 73 | Temp 98.3°F | Ht 71.0 in | Wt 330.2 lb

## 2023-08-24 DIAGNOSIS — Z1159 Encounter for screening for other viral diseases: Secondary | ICD-10-CM

## 2023-08-24 DIAGNOSIS — Z125 Encounter for screening for malignant neoplasm of prostate: Secondary | ICD-10-CM

## 2023-08-24 DIAGNOSIS — Z114 Encounter for screening for human immunodeficiency virus [HIV]: Secondary | ICD-10-CM

## 2023-08-24 DIAGNOSIS — Z0000 Encounter for general adult medical examination without abnormal findings: Secondary | ICD-10-CM | POA: Diagnosis not present

## 2023-08-24 LAB — CBC WITH DIFFERENTIAL/PLATELET
Basophils Absolute: 0 10*3/uL (ref 0.0–0.1)
Basophils Relative: 0.4 % (ref 0.0–3.0)
Eosinophils Absolute: 0.2 10*3/uL (ref 0.0–0.7)
Eosinophils Relative: 3.5 % (ref 0.0–5.0)
HCT: 41.9 % (ref 39.0–52.0)
Hemoglobin: 14 g/dL (ref 13.0–17.0)
Lymphocytes Relative: 32.2 % (ref 12.0–46.0)
Lymphs Abs: 1.9 10*3/uL (ref 0.7–4.0)
MCHC: 33.3 g/dL (ref 30.0–36.0)
MCV: 98.1 fL (ref 78.0–100.0)
Monocytes Absolute: 0.6 10*3/uL (ref 0.1–1.0)
Monocytes Relative: 9.8 % (ref 3.0–12.0)
Neutro Abs: 3.2 10*3/uL (ref 1.4–7.7)
Neutrophils Relative %: 54.1 % (ref 43.0–77.0)
Platelets: 231 10*3/uL (ref 150.0–400.0)
RBC: 4.27 Mil/uL (ref 4.22–5.81)
RDW: 13.4 % (ref 11.5–15.5)
WBC: 5.9 10*3/uL (ref 4.0–10.5)

## 2023-08-24 LAB — BASIC METABOLIC PANEL
BUN: 21 mg/dL (ref 6–23)
CO2: 27 meq/L (ref 19–32)
Calcium: 9.3 mg/dL (ref 8.4–10.5)
Chloride: 102 meq/L (ref 96–112)
Creatinine, Ser: 1.08 mg/dL (ref 0.40–1.50)
GFR: 77.27 mL/min (ref >60.00)
Glucose, Bld: 102 mg/dL — ABNORMAL HIGH (ref 70–99)
Potassium: 4.4 meq/L (ref 3.5–5.1)
Sodium: 139 meq/L (ref 135–145)

## 2023-08-24 LAB — LIPID PANEL
Cholesterol: 187 mg/dL (ref 0–200)
HDL: 53.2 mg/dL (ref >39.00)
LDL Cholesterol: 111 mg/dL — ABNORMAL HIGH (ref 0–99)
NonHDL: 133.57
Total CHOL/HDL Ratio: 4
Triglycerides: 114 mg/dL (ref 0.0–149.0)
VLDL: 22.8 mg/dL (ref 0.0–40.0)

## 2023-08-24 LAB — HEPATIC FUNCTION PANEL
ALT: 11 U/L (ref 0–53)
AST: 17 U/L (ref 0–37)
Albumin: 3.9 g/dL (ref 3.5–5.2)
Alkaline Phosphatase: 58 U/L (ref 39–117)
Bilirubin, Direct: 0.2 mg/dL (ref 0.0–0.3)
Total Bilirubin: 1.2 mg/dL (ref 0.2–1.2)
Total Protein: 6.7 g/dL (ref 6.0–8.3)

## 2023-08-24 LAB — TSH: TSH: 1.19 u[IU]/mL (ref 0.35–5.50)

## 2023-08-24 LAB — PSA: PSA: 0.55 ng/mL (ref 0.10–4.00)

## 2023-08-24 MED ORDER — CARBAMAZEPINE 200 MG PO TABS: 200.0000 mg | ORAL_TABLET | Freq: Two times a day (BID) | ORAL | 1 refills | Status: DC

## 2023-08-24 NOTE — Progress Notes (Signed)
   Subjective:    Patient ID: Gregory Fitzgerald, male    DOB: 1965-11-20, 58 y.o.   MRN: [id number]  HPI CPE- UTD on colonoscopy, Tdap, foot exam, microalbumin.  Due for eye exam.  No concerns today.  Patient Care Team    Relationship Specialty Notifications Start End  Sheliah Hatch, MD PCP - General Family Medicine  09/05/18   Reather Littler, MD (Inactive) Consulting Physician Endocrinology  09/05/18   Larey Dresser, DPM Consulting Physician Podiatry  09/05/18   Vision works    08/24/23     Health Maintenance  Topic Date Due   Pneumococcal Vaccine 80-48 Years old (1 of 2 - PCV) Never done   OPHTHALMOLOGY EXAM  Never done   HIV Screening  Never done   Hepatitis C Screening  Never done   Zoster Vaccines- Shingrix (1 of 2) Never done   COVID-19 Vaccine (4 - 2024-25 season) 03/25/2023   HEMOGLOBIN A1C  12/06/2023   FOOT EXAM  01/10/2024   Diabetic kidney evaluation - Urine ACR  02/07/2024   Diabetic kidney evaluation - eGFR measurement  06/07/2024   Colonoscopy  05/15/2026   DTaP/Tdap/Td (2 - Td or Tdap) 09/05/2028   INFLUENZA VACCINE  Completed   HPV VACCINES  Aged Out      Review of Systems Patient reports no hearing changes, anorexia, fever ,adenopathy, persistant/recurrent hoarseness, swallowing issues, chest pain, palpitations, edema, persistant/recurrent cough, hemoptysis, dyspnea (rest,exertional, paroxysmal nocturnal), gastrointestinal  bleeding (melena, rectal bleeding), abdominal pain, excessive heart burn, GU symptoms (dysuria, hematuria, voiding/incontinence issues) syncope, focal weakness, memory loss, numbness & tingling, skin/hair/nail changes, depression, anxiety, abnormal bruising/bleeding, musculoskeletal symptoms/signs.   + decreased vision- due for eye exam    Objective:   Physical Exam General Appearance:    Alert, cooperative, no distress, appears stated age, obese  Head:    Normocephalic, without obvious abnormality, atraumatic  Eyes:    PERRL,  conjunctiva/corneas clear, EOM's intact both eyes       Ears:    Normal TM's and external ear canals, both ears  Nose:   Nares normal, septum midline, mucosa normal, no drainage   or sinus tenderness  Throat:   Lips, mucosa, and tongue normal; teeth and gums normal  Neck:   Supple, symmetrical, trachea midline, no adenopathy;       thyroid:  No enlargement/tenderness/nodules  Back:     Symmetric, no curvature, ROM normal, no CVA tenderness  Lungs:     Clear to auscultation bilaterally, respirations unlabored  Chest wall:    No tenderness or deformity  Heart:    Regular rate and rhythm, S1 and S2 normal, no murmur, rub   or gallop  Abdomen:     Soft, non-tender, bowel sounds active all four quadrants,    no masses, no organomegaly  Genitalia:    deferred  Rectal:    Extremities:   Extremities normal, atraumatic, no cyanosis or edema  Pulses:   2+ and symmetric all extremities  Skin:   Skin color, texture, turgor normal, no rashes or lesions  Lymph nodes:   Cervical, supraclavicular, and axillary nodes normal  Neurologic:   CNII-XII intact. Normal strength, sensation and reflexes      throughout          Assessment & Plan:

## 2023-08-24 NOTE — Patient Instructions (Addendum)
Follow up in 6 months to recheck BP and cholesterol We'll notify you of your lab results and make any changes if needed Continue to work on healthy diet and regular exercise- you can do it! Schedule an eye exam at your convenience Call with any questions or concerns Stay Safe!  Stay Healthy! Welcome Back!!!

## 2023-08-24 NOTE — Assessment & Plan Note (Signed)
Ongoing issue.  BMI 46.  Stressed need for low carb diet and regular exercise.  Check labs to risk stratify.  Will follow.

## 2023-08-24 NOTE — Assessment & Plan Note (Signed)
Pt's PE WNL w/ exception of BMI.  UTD on colonoscopy, Tdap, foot exam, microalbumin.  Due for eye exam- pt to schedule.  Check labs.  Anticipatory guidance provided.

## 2023-08-25 LAB — HIV ANTIBODY (ROUTINE TESTING W REFLEX): HIV 1&2 Ab, 4th Generation: NONREACTIVE

## 2023-08-25 LAB — HEPATITIS C ANTIBODY: Hepatitis C Ab: NONREACTIVE

## 2023-08-27 ENCOUNTER — Encounter: Payer: Self-pay | Admitting: Family Medicine

## 2023-08-27 ENCOUNTER — Telehealth: Payer: Self-pay

## 2023-08-27 NOTE — Telephone Encounter (Signed)
-----   Message from Neena Rhymes sent at 08/27/2023  7:39 AM EST ----- Labs look good!  No changes at this time

## 2023-08-28 NOTE — Telephone Encounter (Signed)
 Pt has been notified.

## 2023-09-17 ENCOUNTER — Ambulatory Visit: Payer: 59 | Admitting: Endocrinology

## 2023-10-10 ENCOUNTER — Other Ambulatory Visit

## 2023-10-16 ENCOUNTER — Encounter: Payer: Self-pay | Admitting: Endocrinology

## 2023-10-16 ENCOUNTER — Ambulatory Visit: Payer: 59 | Admitting: Endocrinology

## 2023-10-16 VITALS — BP 130/92 | HR 85 | Resp 18 | Ht 71.0 in | Wt 331.2 lb

## 2023-10-16 DIAGNOSIS — E118 Type 2 diabetes mellitus with unspecified complications: Secondary | ICD-10-CM | POA: Diagnosis not present

## 2023-10-16 DIAGNOSIS — E7800 Pure hypercholesterolemia, unspecified: Secondary | ICD-10-CM | POA: Diagnosis not present

## 2023-10-16 DIAGNOSIS — Z7985 Long-term (current) use of injectable non-insulin antidiabetic drugs: Secondary | ICD-10-CM | POA: Diagnosis not present

## 2023-10-16 LAB — POCT GLYCOSYLATED HEMOGLOBIN (HGB A1C): Hemoglobin A1C: 5.9 % — AB (ref 4.0–5.6)

## 2023-10-16 MED ORDER — FLUVASTATIN SODIUM ER 80 MG PO TB24: 80.0000 mg | ORAL_TABLET | Freq: Every day | ORAL | 3 refills | Status: AC

## 2023-10-16 MED ORDER — BLOOD GLUCOSE TEST VI STRP: 1.0000 | ORAL_STRIP | Freq: Every day | 3 refills | Status: AC

## 2023-10-16 MED ORDER — TIRZEPATIDE 12.5 MG/0.5ML ~~LOC~~ SOAJ: 12.5000 mg | SUBCUTANEOUS | 11 refills | Status: DC

## 2023-10-16 MED ORDER — LANCETS MISC. MISC: 1.0000 | Freq: Every day | 3 refills | Status: DC

## 2023-10-16 MED ORDER — EZETIMIBE 10 MG PO TABS: 10.0000 mg | ORAL_TABLET | Freq: Every day | ORAL | 3 refills | Status: AC

## 2023-10-16 NOTE — Progress Notes (Signed)
 Outpatient Endocrinology Note [place] Latunya Kissick, MD  10/16/23  Patient's Name: Gregory Fitzgerald    DOB: [birth year]    MRN: [id number]                                                    REASON OF VISIT: Follow up for type 2 diabetes mellitus  PCP: Sheliah Hatch, MD  HISTORY OF PRESENT ILLNESS:   Gregory Fitzgerald is a 58 y.o. old male with past medical history listed below, is here for follow up for type 2 diabetes mellitus.  Patient was last seen by Dr. Lucianne Muss in July 2024.  Pertinent Diabetes History: Patient was diagnosed with type 2 diabetes mellitus in 2006.  No personal history of pancreatitis and / or family history of medullary thyroid carcinoma or MEN 2B syndrome.   Chronic Diabetes Complications : Retinopathy: no. Last ophthalmology exam was done on annually, following with ophthalmology regularly.  Nephropathy: Microalbuminuria present, on ACE/ARB /lisinopril and dapagliflozin. Peripheral neuropathy: no Coronary artery disease: no Stroke: no  Relevant comorbidities and cardiovascular risk factors: Obesity: yes Body mass index is 46.19 kg/m.  Hypertension: Yes  Hyperlipidemia : Yes, on statin   Treated with Lipitor 20 mg previously but this was stopped because of leg stiffness; also may have had some side effects of leg stiffness from Crestor    He has no leg stiffness or aching with Lescol XL, taking 80 mg   Current / Home Diabetic regimen includes: Xigduo XR 11/998 mg, 2 tablets daily,  Ozempic 2 mg weekly   Prior diabetic medications: He used to be on glimepiride, Victoza, Invokana in the past.  There was plan to start basal insulin in June 2024 however he never restarted.  Glycemic data:   He has been using freestyle libre glucometer.  Patient has a different test strip and has not been checking blood sugar lately.   Hypoglycemia: Patient has no hypoglycemic episodes. Patient has hypoglycemia awareness.  Factors modifying glucose control: 1.  Diabetic diet  assessment: 3 meals a day.  2.  Staying active or exercising: Exercising in the gym 4 days a week.  3.  Medication compliance: compliant all of the time.  Interval history  Hemoglobin A1c today 5.9%.  He has been tolerating Ozempic well and also taking Xigduo.  Diabetes regimen reviewed and as noted above.  Patient has remained stable on weight, has not been losing any more weight, a plateau on the weight loss.  He has no numbness and ting of the feet.  No other complaints today.  REVIEW OF SYSTEMS As per history of present illness.   PAST MEDICAL HISTORY: Past Medical History:  Diagnosis Date   Arthritis    knees   Diabetes (HCC)    GERD (gastroesophageal reflux disease)    years ago, not currently    Hyperlipidemia    Hypertension    Trigeminal neuralgia     PAST SURGICAL HISTORY: Past Surgical History:  Procedure Laterality Date   club foot surgery     age 53 months   KNEE SURGERY     x 2 in high school     ALLERGIES: No Known Allergies  FAMILY HISTORY:  Family History  Problem Relation Age of Onset   Diabetes Mother    Diabetes Father    Lymphoma Father  Colon cancer Neg Hx    Colon polyps Neg Hx    Esophageal cancer Neg Hx    Rectal cancer Neg Hx    Stomach cancer Neg Hx     SOCIAL HISTORY: Social History   Socioeconomic History   Marital status: Married    Spouse name: Not on file   Number of children: Not on file   Years of education: Not on file   Highest education level: Not on file  Occupational History   Not on file  Tobacco Use   Smoking status: Never   Smokeless tobacco: Never  Substance and Sexual Activity   Alcohol use: Yes    Comment: occasionally    Drug use: Never   Sexual activity: Not on file  Other Topics Concern   Not on file  Social History Narrative   Not on file   Social Drivers of Health   Financial Resource Strain: Not on file  Food Insecurity: Not on file  Transportation Needs: Not on file  Physical Activity:  Not on file  Stress: Not on file  Social Connections: Not on file    MEDICATIONS:  Current Outpatient Medications  Medication Sig Dispense Refill   aspirin 81 MG tablet Take 81 mg by mouth daily.     blood glucose meter kit and supplies KIT Dispense based on patient and insurance preference. Use up to two times daily as directed. 1 each 0   Blood Glucose Monitoring Suppl (ACCU-CHEK AVIVA PLUS) w/Device KIT Use to check blood sugar twice a daily. 1 kit 0   carbamazepine (TEGRETOL) 200 MG tablet Take 1 tablet (200 mg total) by mouth 2 (two) times daily. 180 tablet 1   clotrimazole-betamethasone (LOTRISONE) cream APPLY TO AFFECTED AREA TWICE A DAY 45 g 0   Dapagliflozin Pro-metFORMIN ER (XIGDUO XR) 11-998 MG TB24 Take 2 tablets by mouth daily. 180 tablet 3   diltiazem (CARDIZEM CD) 120 MG 24 hr capsule TAKE 1 CAPSULE BY MOUTH EVERY DAY 90 capsule 1   fluvastatin XL (LESCOL XL) 80 MG 24 hr tablet Take 1 tablet (80 mg total) by mouth daily. 90 tablet 3   Glucose Blood (BLOOD GLUCOSE TEST STRIPS) STRP 1 each by In Vitro route daily. May substitute to any manufacturer covered by patient's insurance. 100 each 3   glucose blood (FREESTYLE LITE) test strip USE AS INSTRUCTED TWICE A DAY 100 strip 3   Insulin Pen Needle (NOVOFINE) 32G X 6 MM MISC Use 1 per day to inject insulin 50 each 5   Lancets Misc. MISC 1 each by Does not apply route daily. May substitute to any manufacturer covered by patient's insurance. 100 each 3   lisinopril (ZESTRIL) 40 MG tablet Take 1 tablet (40 mg total) by mouth daily. 90 tablet 3   meloxicam (MOBIC) 15 MG tablet Mobic 15 mg tablet  Take 1 tablet every day by oral route as needed for 30 days.  take with meals     Semaglutide, 2 MG/DOSE, (OZEMPIC, 2 MG/DOSE,) 8 MG/3ML SOPN Inject 2 mg weekly 9 mL 3   tadalafil (CIALIS) 20 MG tablet TAKE 1 TABLET EVERY DAY AS NEEDED FOR ERECTILE DYSFUNCTION 10 tablet 2   tirzepatide (MOUNJARO) 12.5 MG/0.5ML Pen Inject 12.5 mg into the skin  once a week. 2 mL 11   ezetimibe (ZETIA) 10 MG tablet Take 1 tablet (10 mg total) by mouth daily. 90 tablet 3   No current facility-administered medications for this visit.    PHYSICAL EXAM: Vitals:  10/16/23 1010 10/16/23 1011  BP: (!) 132/90 (!) 130/92  Pulse: 85   Resp: 18   SpO2: 94%   Weight: (!) 331 lb 3.2 oz (150.2 kg)   Height: 5\' 11"  (1.803 m)     Body mass index is 46.19 kg/m.  Wt Readings from Last 3 Encounters:  10/16/23 (!) 331 lb 3.2 oz (150.2 kg)  08/24/23 (!) 330 lb 4 oz (149.8 kg)  06/14/23 (!) 331 lb 6.4 oz (150.3 kg)    General: Well developed, well nourished male in no apparent distress.  HEENT: AT/[place], no external lesions.  Eyes: Conjunctiva clear and no icterus. Neck: Neck supple  Lungs: Respirations not labored Neurologic: Alert, oriented, normal speech Extremities / Skin: Dry. No sores or rashes noted.  Psychiatric: Does not appear depressed or anxious  Diabetic Foot Exam - Simple   No data filed    LABS Reviewed Lab Results  Component Value Date   HGBA1C 6.6 (H) 06/08/2023   HGBA1C 7.0 (H) 02/07/2023   HGBA1C 8.8 (H) 11/03/2022   Lab Results  Component Value Date   FRUCTOSAMINE 311 (H) 01/04/2023   FRUCTOSAMINE 403 (H) 03/08/2020   FRUCTOSAMINE 239 12/19/2017   Lab Results  Component Value Date   CHOL 187 08/24/2023   HDL 53.20 08/24/2023   LDLCALC 111 (H) 08/24/2023   LDLDIRECT 113.0 11/03/2022   TRIG 114.0 08/24/2023   CHOLHDL 4 08/24/2023   Lab Results  Component Value Date   MICRALBCREAT 94.3 (H) 02/07/2023   MICRALBCREAT 81.4 (H) 11/03/2022   Lab Results  Component Value Date   CREATININE 1.08 08/24/2023   Lab Results  Component Value Date   GFR 77.27 08/24/2023    ASSESSMENT / PLAN  1. Controlled type 2 diabetes mellitus with complication, without long-term current use of insulin (HCC)   2. Hypercholesterolemia   3. Morbid obesity (HCC)      Diabetes Mellitus type 2, complicated by microalbuminuria. -  Diabetic status / severity: Controlled.  Lab Results  Component Value Date   HGBA1C 6.6 (H) 06/08/2023    - Hemoglobin A1c goal : <6.5% He has controlled diabetes mellitus.  Hemoglobin A1c today 5.9%.  He is no longer losing body weight on Ozempic.  Would like to switch to [facility] to have weight loss benefit.  - Medications: No change.  I) continue Xigduo XR 11/998 mg 2 tablets daily. II) stop Ozempic 2 mg weekly.  And restart Mounjaro 12.5 mg weekly.  After 4 weeks if no side effect asked to call our clinic will increase to 15 mg weekly.  - Home glucose testing: In the morning fasting and occasionally at bedtime.  Sent prescription for test supplies. - Discussed/ Gave Hypoglycemia treatment plan.  # Consult : not required at this time.   # Annual urine for microalbuminuria/ creatinine ratio,  microalbuminuria currently, continue ACE/ARB /lisinopril and dapagliflozin.  Microalbuminuria improving, expect to improve with better control of diabetes.  Will check in next visit. Last  Lab Results  Component Value Date   MICRALBCREAT 94.3 (H) 02/07/2023    # Foot check nightly.  # Annual dilated diabetic eye exams.   - Diet: Make healthy diabetic food choices - Life style / activity / exercise: Discussed.  2. Blood pressure  -  BP Readings from Last 1 Encounters:  10/16/23 (!) 130/92    - Control is in target.  - No change in current plans.  3. Lipid status / Hyperlipidemia - Last  Lab Results  Component Value Date  LDLCALC 111 (H) 08/24/2023   - Continue Escol XL, taking 80 mg (currently not taking), reordered.  Zetia 10 mg daily.  # Morbid obesity BMI 46 -He wants to consider bariatric surgery, referral to bariatric surgery.  Diagnoses and all orders for this visit:  Controlled type 2 diabetes mellitus with complication, without long-term current use of insulin (HCC) -     ezetimibe (ZETIA) 10 MG tablet; Take 1 tablet (10 mg total) by mouth daily. -      tirzepatide (MOUNJARO) 12.5 MG/0.5ML Pen; Inject 12.5 mg into the skin once a week. -     Microalbumin / creatinine urine ratio -     Hemoglobin A1c -     Basic Metabolic Panel (BMET) -     Glucose Blood (BLOOD GLUCOSE TEST STRIPS) STRP; 1 each by In Vitro route daily. May substitute to any manufacturer covered by patient's insurance. -     Lancets Misc. MISC; 1 each by Does not apply route daily. May substitute to any manufacturer covered by patient's insurance.  Hypercholesterolemia -     fluvastatin XL (LESCOL XL) 80 MG 24 hr tablet; Take 1 tablet (80 mg total) by mouth daily. -     ezetimibe (ZETIA) 10 MG tablet; Take 1 tablet (10 mg total) by mouth daily. -     Lipid panel  Morbid obesity (HCC) -     Amb Referral to Bariatric Surgery    DISPOSITION Follow up in clinic in 4  months suggested.  Labs prior to follow-up visit as ordered.  Patient wants to complete at local [facility] laboratory.   All questions answered and patient verbalized understanding of the plan.  [place] Taegan Standage, MD [facility] Endocrinology [facility] [address] [place], [address] [place], [place] [address] Phone # [phone]  At least part of this note was generated using voice recognition software. Inadvertent word errors may have occurred, which were not recognized during the proofreading process.

## 2023-10-16 NOTE — Addendum Note (Signed)
 Addended by: Tera Partridge on: 10/16/2023 10:57 AM   Modules accepted: Orders

## 2023-10-16 NOTE — Patient Instructions (Addendum)
 continue Xigduo XR 11/998 mg 2 tablets daily. Stop Ozempic 2 mg weekly.  Start Mounjaro 12.5mg  weekly. After 4 injection, call our clinic, will send new prescription for 15 mg weekly.   Complete lab 1 weeks prior to follow up visit in 4 months.

## 2023-11-08 ENCOUNTER — Telehealth: Payer: Self-pay | Admitting: Endocrinology

## 2023-11-12 NOTE — Telephone Encounter (Signed)
 error

## 2023-12-04 ENCOUNTER — Other Ambulatory Visit (HOSPITAL_COMMUNITY): Payer: Self-pay | Admitting: General Surgery

## 2023-12-11 ENCOUNTER — Encounter: Payer: Self-pay | Admitting: Dietician

## 2023-12-11 ENCOUNTER — Encounter: Attending: General Surgery | Admitting: Dietician

## 2023-12-11 VITALS — Ht 72.0 in | Wt 333.4 lb

## 2023-12-11 DIAGNOSIS — E669 Obesity, unspecified: Secondary | ICD-10-CM | POA: Insufficient documentation

## 2023-12-11 DIAGNOSIS — E119 Type 2 diabetes mellitus without complications: Secondary | ICD-10-CM | POA: Diagnosis present

## 2023-12-11 NOTE — Progress Notes (Signed)
 Nutrition Assessment for Bariatric Surgery: Pre-Surgery Behavioral and Nutrition Intervention Program   Medical Nutrition Therapy  Appt Start Time: 1355    End Time: 1508  Patient was seen on 12/11/2023 for Pre-Operative Nutrition Assessment. Purpose of todays visit  enhance perioperative outcomes along with a healthy weight maintenance   Referral stated Supervised Weight Loss (SWL) visits needed: 0  Pt completed visits.   Pt has cleared nutrition requirements.   Planned surgery: Sleeve Gastrectomy Pt expectation of surgery: under 300 lbs, maybe 225-230  NUTRITION ASSESSMENT   Anthropometrics  Start weight at [facility]: 333.4 lbs (date: 12/11/2023)  Height: 72 in BMI: 45.22 kg/m2     Clinical   Pharmacotherapy: History of weight loss medication used: Ozempic , Mounjaro (not currently take either)  Medical hx: T2DM,  Medications: XIGDUO  XR, fluvastatin  XL, lisinopril  Labs: LDL 111; glucose 102; A1c 5.9 Notable signs/symptoms: none noted Any previous deficiencies? No  Evaluation of Nutritional Deficiencies: Micronutrient Nutrition Focused Physical Exam: Hair: No issues observed Eyes: No issues observed Mouth: No issues observed Neck: No issues observed Nails: No issues observed Skin: No issues observed  Lifestyle & Dietary Hx  Pt states he has been diagnosed with diabetes since 2008, stating he manages it with diet and medication. Pt states he works nights.  Current Physical Activity Recommendations state 150 minutes per week of moderate to vigorous movement including Cardio and 1-2 days of resistance activities as well as flexibility/balance activities:  Pts current physical activity: 4-5 days per week, 30 minutes cardio, and resistance exercises 45-60 minutes, with 100% recommendation reached  Sleep Hygiene: duration and quality: sleeps well, stating he can go to sleep when he lays down.  Current Patient Perceived Stress Level as stated by pt on a scale of 1-10:  5-6        Stress Management Techniques: pray  According to the Dietary Guidelines for Americans Recommendation: equivalent 1.5-2 cups fruits per day, equivalent 2-3 cups vegetables per day and at least half all grains whole  Fruit servings per day (on average): 0-1, meeting 0-50% recommendation  Non-starchy vegetable servings per day (on average): 1-2, meeting 50% recommendation  Whole Grains per day (on average): 0  Number of meals missed/skipped per week out of 21: 2  24-Hr Dietary Recall First Meal: wake at 3:30 pm: grilled chicken or shrimp alfredo pasta, or steaks or zucchini and yellow squash with onion or fried fish or baked salmon Snack: nap before work Second Meal: 4 am: left overs or premier protein shake  Snack: protein shake Third Meal: Chief Financial Officer Snack: sleep around 11:00 am Beverages: water with flavorings, sparkling water (flavored), diet coke occasionally, skim milk  Alcoholic beverages per week: 0   Estimated Energy Needs Calories: 1700  NUTRITION DIAGNOSIS  Overweight/obesity (Organ-3.3) related to past poor dietary habits and physical inactivity as evidenced by patient w/ planned sleeve surgery following dietary guidelines for continued weight loss.  NUTRITION INTERVENTION  Nutrition counseling (C-1) and education (E-2) to facilitate bariatric surgery goals.  Educated pt on micronutrient deficiencies post-surgery and behavioral/dietary strategies to start in order to mitigate that risk   Behavioral and Dietary Interventions Pre-Op Goals Reviewed with the Patient Nutrition: Healthy Eating Behaviors Switch to non-caloric, non-carbonated and non-caffeinated beverages such as  water, unsweetened tea, Crystal Light and zero calorie beverages (aim for 64 oz. per day) Cut out grazing between meals or at night  Find a protein shake you like Eat every 3-5 hours        Eliminate distractions  while eating (TV, computer, reading, driving, texting) Take 16-10 minutes to eat  a meal  Decrease high sugar foods/decrease high fat/fried foods Eliminate alcoholic beverages Increase protein intake (eggs, fish, chicken, yogurt) before surgery Eat non starchy vegetables 2 times a day 7 days a week Eat complex carbohydrates such as whole grains and fruits   Behavioral Modification: Physical Activity Increase my usual daily activity (use stairs, park farther, etc.) Engage in _______________________  activity  _______ minutes ______ times per week  Other:    _________________________________________________________________     Problem Solving I will think about my usual eating patterns and how to tweak them How can my friends and family support me Barriers to starting my changes Learn and understand appetite verses hunger   Healthy Coping Allow for ___________ activities per week to help me manage stress Reframe negative thoughts I will keep a picture of someone or something that is my inspiration & look at it daily   Monitoring  Weigh myself once a week  Measure my progress by monitoring how my clothes fit Keep a food record of what I eat and drink for the next ________ (time period) Take pictures of what I eat and drink for the next ________ (time period) Use an app to count steps/day for the next_______ (time period) Measure my progress such as increased energy and more restful sleep Monitor your acid reflux and bowel habits, are they getting better?   *Goals that are bolded indicate the pt would like to start working towards these  Handouts Provided Include  Bariatric Surgery handouts (Nutrition Visits, Pre Surgery Behavioral Change Goals, Protein Shakes Brands to Choose From, Vitamins & Mineral Supplementation)  Learning Style & Readiness for Change Teaching method utilized: Visual, Auditory, and hands on  Demonstrated degree of understanding via: Teach Back  Readiness Level: preparation Barriers to learning/adherence to lifestyle change: nothing  identified  RD's Notes for Next Visit   MONITORING & EVALUATION Dietary intake, weekly physical activity, body weight, and preoperative behavioral change goals   Next Steps  Pt has completed visits. No further supervised visits required/recommended. Patient is to follow up at [facility] for pre-op class >2 weeks prior to scheduled surgery.

## 2023-12-19 ENCOUNTER — Encounter: Payer: Self-pay | Admitting: *Deleted

## 2023-12-20 ENCOUNTER — Encounter: Payer: Self-pay | Admitting: Neurology

## 2023-12-20 ENCOUNTER — Ambulatory Visit: Admitting: Neurology

## 2023-12-20 VITALS — BP 113/75 | HR 70 | Ht 72.0 in | Wt 336.0 lb

## 2023-12-20 DIAGNOSIS — Z9189 Other specified personal risk factors, not elsewhere classified: Secondary | ICD-10-CM | POA: Diagnosis not present

## 2023-12-20 DIAGNOSIS — R0683 Snoring: Secondary | ICD-10-CM | POA: Diagnosis not present

## 2023-12-20 DIAGNOSIS — R351 Nocturia: Secondary | ICD-10-CM

## 2023-12-20 DIAGNOSIS — Z6841 Body Mass Index (BMI) 40.0 and over, adult: Secondary | ICD-10-CM

## 2023-12-20 NOTE — Progress Notes (Signed)
 Subjective:    Patient ID: Gregory Fitzgerald is a 58 y.o. male.  HPI    Gregory Fairy, MD, PhD [facility] [address] [address] [place], [place] [address]  Dear Dr. Dorrie Gaudier,   I saw your patient, Gregory Fitzgerald, upon your kind request in my sleep clinic today for initial consultation of his sleep disorder, in particular, concern for underlying obstructive sleep apnea.  The patient is unaccompanied today.  As you know, Mr. Heyward is a 58 year old male with an underlying medical history of diabetes, arthritis, reflux disease, hypertension, hyperlipidemia, facial pain/trigeminal neuralgia (for which he saw my colleague Dr. 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  He works third shift.  His Epworth sleepiness score is 5 out of 24, fatigue severity score is 13 out of 63.  I reviewed the office note from 11/29/2023.  He is being considered for bariatric surgery, particularly sleeve gastrectomy.  He works from 8 PM to 8 AM, Sunday through Thursday.  He works as a Occupational hygienist for [employer], he Surveyor, quantity.  He lives with his wife and 49 year old son, he also has 2 older daughters.  They have 2 dogs in the household and the dogs typically sleep in dog beds.  He has a TV in the bedroom and it tends to be on when he goes to bed but he tries to turn it off.  Bedtime is generally around 9 AM and rise time around 3 PM.  He has 1 bathroom break once he goes to sleep.  Denies recurrent headaches.  He does not aware of any family history of sleep apnea.  He believes he had a tonsillectomy as a child.  He does not drink caffeine daily, maybe 3 days a week.  He typically drinks a soda.  He does not drink any coffee.  He is a non-smoker.  He drinks alcohol rarely.  His Past Medical History Is Significant For: Past Medical History:   Diagnosis Date   Arthritis    knees   Diabetes (HCC)    GERD (gastroesophageal reflux disease)    years ago, not currently    Hyperlipidemia    Hypertension    Trigeminal neuralgia     His Past Surgical History Is Significant For: Past Surgical History:  Procedure Laterality Date   club foot surgery     age 57 months   KNEE SURGERY     x 2 in high school     His Family History Is Significant For: Family History  Problem Relation Age of Onset   Diabetes Mother    Diabetes Father    Lymphoma Father    Colon cancer Neg Hx    Colon polyps Neg Hx    Esophageal cancer Neg Hx    Rectal cancer Neg Hx    Stomach cancer Neg Hx     His Social History Is Significant For: Social History   Socioeconomic History   Marital status: Married    Spouse name: Not on file   Number of children: Not on file   Years of education: Not on file   Highest education level: Not on file  Occupational History   Not on file  Tobacco Use   Smoking status: Never   Smokeless tobacco: Never  Vaping Use   Vaping status: Never Used  Substance and Sexual Activity   Alcohol use: Not Currently    Comment: occasionally    Drug use: Never   Sexual activity: Not on file  Other Topics Concern   Not on file  Social History Narrative   Not on file   Social Drivers of Health   Financial Resource Strain: Not on file  Food Insecurity: Not on file  Transportation Needs: Not on file  Physical Activity: Not on file  Stress: Not on file  Social Connections: Not on file    His Allergies Are:  No Known Allergies:   His Current Medications Are:  Outpatient Encounter Medications as of 12/20/2023  Medication Sig   aspirin 81 MG tablet Take 81 mg by mouth daily.   blood glucose meter kit and supplies KIT Dispense based on patient and insurance preference. Use up to two times daily as directed.   Blood Glucose Monitoring Suppl (ACCU-CHEK AVIVA PLUS) w/Device KIT Use to check blood sugar twice a daily.    carbamazepine  (TEGRETOL ) 200 MG tablet Take 1 tablet (200 mg total) by mouth 2 (two) times daily.   clotrimazole -betamethasone  (LOTRISONE ) cream APPLY TO AFFECTED AREA TWICE A DAY   Dapagliflozin  Pro-metFORMIN  ER (XIGDUO  XR) 11-998 MG TB24 Take 2 tablets by mouth daily.   diltiazem  (CARDIZEM  CD) 120 MG 24 hr capsule TAKE 1 CAPSULE BY MOUTH EVERY DAY   ezetimibe  (ZETIA ) 10 MG tablet Take 1 tablet (10 mg total) by mouth daily.   fluvastatin  XL (LESCOL  XL) 80 MG 24 hr tablet Take 1 tablet (80 mg total) by mouth daily.   Glucose Blood (BLOOD GLUCOSE TEST STRIPS) STRP 1 each by In Vitro route daily. May substitute to any manufacturer covered by patient's insurance.   glucose blood (FREESTYLE LITE) test strip USE AS INSTRUCTED TWICE A DAY   Insulin  Pen Needle (NOVOFINE) 32G X 6 MM MISC Use 1 per day to inject insulin    Lancets Misc. MISC 1 each by Does not apply route daily. May substitute to any manufacturer covered by patient's insurance.   lisinopril  (ZESTRIL ) 40 MG tablet Take 1 tablet (40 mg total) by mouth daily.   meloxicam (MOBIC) 15 MG tablet Mobic 15 mg tablet  Take 1 tablet every day by oral route as needed for 30 days.  take with meals   Semaglutide , 2 MG/DOSE, (OZEMPIC , 2 MG/DOSE,) 8 MG/3ML SOPN Inject 2 mg weekly   tadalafil  (CIALIS ) 20 MG tablet TAKE 1 TABLET EVERY DAY AS NEEDED FOR ERECTILE DYSFUNCTION   tirzepatide (MOUNJARO) 12.5 MG/0.5ML Pen Inject 12.5 mg into the skin once a week.   No facility-administered encounter medications on file as of 12/20/2023.  :   Review of Systems:  Out of a complete 14 point review of systems, all are reviewed and negative with the exception of these symptoms as listed below:   Review of Systems  Neurological:        Pt in room 5. Alone. Here for sleep consult. Paper Fax - [facility] / bariatric pre op testing, snoring, fatigue. No prior history of prior sleep study, no headaches, HTN ( controlled with medication)     Objective:  Neurological  Exam  Physical Exam Physical Examination:   Vitals:   12/20/23 1026  BP: 113/75  Pulse: 70  SpO2: 94%    General Examination: The patient is a very pleasant 58 y.o. male in no acute distress. He appears well-developed and well-nourished and well groomed.   HEENT: Normocephalic, atraumatic, pupils are equal, round and reactive  to light, extraocular tracking is good without limitation to gaze excursion or nystagmus noted. Hearing is grossly intact. Face is symmetric with normal facial animation. Speech is clear with no dysarthria noted. There is no hypophonia. There is no lip, neck/head, jaw or voice tremor. Neck is supple with full range of passive and active motion. There are no carotid bruits on auscultation. Oropharynx exam reveals: mild mouth dryness, adequate dental hygiene and moderate airway crowding, due to small airway entry and redundant soft palate, larger tongue, tonsils not fully visualized, could be absent and tip of uvula not fully visualized.  Mallampati class IV.  Neck circumference 17-1/2 inches.  Tongue protrudes centrally and palate elevates symmetrically.  Chest: Clear to auscultation without wheezing, rhonchi or crackles noted.  Heart: S1+S2+0, regular and normal without murmurs, rubs or gallops noted.   Abdomen: Soft, non-tender and non-distended.  Extremities: There is trace pitting edema in the distal lower extremities bilaterally.   Skin: Warm and dry without trophic changes noted.   Musculoskeletal: exam reveals no obvious joint deformities.   Neurologically:  Mental status: The patient is awake, alert and oriented in all 4 spheres. His immediate and remote memory, attention, language skills and fund of knowledge are appropriate. There is no evidence of aphasia, agnosia, apraxia or anomia. Speech is clear with normal prosody and enunciation. Thought process is linear. Mood is normal and affect is normal.  Cranial nerves II - XII are as described above under  HEENT exam.  Motor exam: Normal bulk, strength and tone is noted. There is no obvious action or resting tremor.  Fine motor skills and coordination: grossly intact.  Cerebellar testing: No dysmetria or intention tremor. There is no truncal or gait ataxia.  Sensory exam: intact to light touch in the upper and lower extremities.  Gait, station and balance: He stands easily. No veering to one side is noted. No leaning to one side is noted. Posture is age-appropriate and stance is narrow based. Gait shows normal stride length and normal pace. No problems turning are noted.   Assessment and Plan:  In summary, ELZA SORTOR is a very pleasant 58 y.o.-year old male with an underlying medical history of diabetes, arthritis, reflux disease, hypertension, hyperlipidemia, facial pain/trigeminal neuralgia (for which he saw my colleague Dr. 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 A laboratory attended sleep study is typically considered "gold standard" for evaluation of sleep disordered breathing.   I had a long chat with the patient about my findings and the diagnosis of sleep apnea, particularly OSA, its prognosis and treatment options. We talked about medical/conservative treatments, surgical interventions and non-pharmacological approaches for symptom control. I explained, in particular, the risks and ramifications of untreated moderate to severe OSA, especially with respect to developing cardiovascular disease down the road, including congestive heart failure (CHF), difficult to treat hypertension, cardiac arrhythmias (particularly A-fib), neurovascular complications including TIA, stroke and dementia. Even type 2 diabetes has, in part, been linked to untreated OSA. Symptoms of untreated OSA may include (but may  not be limited to) daytime sleepiness, nocturia (i.e. frequent nighttime urination), memory problems, mood irritability and suboptimally controlled or worsening mood disorder such as depression and/or anxiety, lack of energy, lack of motivation, physical discomfort, as well as recurrent headaches, especially morning or nocturnal headaches. We talked about the  importance of maintaining a healthy lifestyle and striving for healthy weight. In addition, we talked about the importance of striving for and maintaining good sleep hygiene. I recommended a sleep study at this time. I outlined the differences between a laboratory attended sleep study which is considered more comprehensive and accurate over the option of a home sleep test (HST); the latter may lead to underestimation of sleep disordered breathing in some instances and does not help with diagnosing upper airway resistance syndrome and is not accurate enough to diagnose primary central sleep apnea typically. I outlined possible surgical and non-surgical treatment options of OSA, including the use of a positive airway pressure (PAP) device (i.e. CPAP, AutoPAP/APAP or BiPAP in certain circumstances), a custom-made dental device (aka oral appliance, which would require a referral to a specialist dentist or orthodontist typically, and is generally speaking not considered for patients with full dentures or edentulous state), upper airway surgical options, such as traditional UPPP (which is not considered a first-line treatment) or the Inspire device (hypoglossal nerve stimulator, which would involve a referral for consultation with an ENT surgeon, after careful selection, following inclusion criteria - also not first-line treatment). I explained the PAP treatment option to the patient in detail, as this is generally considered first-line treatment.  The patient indicated that he would be willing to try PAP therapy, if the need arises. I explained the importance of  being compliant with PAP treatment, not only for insurance purposes but primarily to improve patient's symptoms symptoms, and for the patient's long term health benefit, including to reduce His cardiovascular risks longer-term.    We will pick up our discussion about the next steps and treatment options after testing.  We will keep him posted as to the test results by phone call and/or MyChart messaging where possible.  We will plan to follow-up in sleep clinic accordingly as well.  I answered all his questions today and the patient was in agreement.   I encouraged him to call with any interim questions, concerns, problems or updates or email us  through MyChart.  Generally speaking, sleep test authorizations may take up to 2 weeks, sometimes less, sometimes longer, the patient is encouraged to get in touch with us  if they do not hear back from the sleep lab staff directly within the next 2 weeks.  Thank you very much for allowing me to participate in the care of this nice patient. If I can be of any further assistance to you please do not hesitate to call me at [phone].  Sincerely,   Gregory Fairy, MD, PhD

## 2023-12-20 NOTE — Patient Instructions (Signed)

## 2023-12-25 ENCOUNTER — Ambulatory Visit (HOSPITAL_COMMUNITY)
Admission: RE | Admit: 2023-12-25 | Discharge: 2023-12-25 | Disposition: A | Discharge status: Home / Self Care | Source: Ambulatory Visit | Attending: General Surgery | Admitting: General Surgery

## 2023-12-25 ENCOUNTER — Encounter (HOSPITAL_COMMUNITY)
Admission: RE | Admit: 2023-12-25 | Discharge: 2023-12-25 | Disposition: A | Discharge status: Home / Self Care | Source: Ambulatory Visit | Attending: General Surgery | Admitting: General Surgery

## 2023-12-25 ENCOUNTER — Other Ambulatory Visit: Payer: Self-pay

## 2023-12-31 ENCOUNTER — Telehealth: Payer: Self-pay | Admitting: Neurology

## 2023-12-31 NOTE — Telephone Encounter (Signed)
 NPSG [employer] pending

## 2024-01-02 NOTE — Telephone Encounter (Signed)
 HST [employer] no auth req U[employer] denied NPSG

## 2024-01-11 ENCOUNTER — Ambulatory Visit (INDEPENDENT_AMBULATORY_CARE_PROVIDER_SITE_OTHER): Admitting: Neurology

## 2024-01-11 DIAGNOSIS — Z9189 Other specified personal risk factors, not elsewhere classified: Secondary | ICD-10-CM

## 2024-01-11 DIAGNOSIS — G4733 Obstructive sleep apnea (adult) (pediatric): Secondary | ICD-10-CM | POA: Diagnosis not present

## 2024-01-11 DIAGNOSIS — R351 Nocturia: Secondary | ICD-10-CM

## 2024-01-11 DIAGNOSIS — R0683 Snoring: Secondary | ICD-10-CM

## 2024-01-28 ENCOUNTER — Encounter: Payer: Self-pay | Admitting: Endocrinology

## 2024-01-28 ENCOUNTER — Other Ambulatory Visit: Payer: Self-pay

## 2024-01-28 DIAGNOSIS — E1165 Type 2 diabetes mellitus with hyperglycemia: Secondary | ICD-10-CM

## 2024-01-28 MED ORDER — TADALAFIL 20 MG PO TABS: ORAL_TABLET | ORAL | 2 refills | Status: AC

## 2024-01-30 NOTE — Progress Notes (Signed)
 See procedure note.

## 2024-02-01 ENCOUNTER — Ambulatory Visit: Payer: Self-pay | Admitting: Neurology

## 2024-02-01 DIAGNOSIS — G4733 Obstructive sleep apnea (adult) (pediatric): Secondary | ICD-10-CM

## 2024-02-01 NOTE — Procedures (Signed)
 [facility]  HOME SLEEP TEST (SANSA) REPORT (Mail-Out Device):   STUDY DATE: 01/18/2024  DOB: [birth year]  MRN: [id number]  ORDERING CLINICIAN: True Mar, MD, PhD   REFERRING CLINICIAN: Dr. Stevie  CLINICAL INFORMATION/HISTORY: 58 year old male with an underlying medical history of diabetes, arthritis, reflux disease, hypertension, hyperlipidemia, facial pain/trigeminal neuralgia (for which he saw my colleague Dr. 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    PATIENT'S LAST REPORTED EPWORTH SLEEPINESS SCORE (ESS): 5/24.  BMI (at the time of sleep clinic visit and/or test date): 45.6 kg/m  FINDINGS:   Study Protocol:    The SANSA single-point-of-skin-contact chest-worn sensor - an FDA cleared and DOT approved type 4 home sleep test device - measures eight physiological channels,  including blood oxygen saturation (measured via PPG [photoplethysmography]), EKG-derived heart rate, respiratory effort, chest movement (measured via accelerometer), snoring, body position, and actigraphy. The device is designed to be worn for up to 10 hours per study.   Sleep Summary:   Total Recording Time (hours, min): 8 hours, 45 min  Total Effective Sleep Time (hours, min):  6 hours, 2 min  Sleep Efficiency (%):    77%   Respiratory Indices:   Calculated sAHI (per hour):  24.5/hour         Oxygen Saturation Statistics:    Oxygen Saturation (%) Mean: 93.5%   Minimum oxygen saturation (%):                 78.3%   O2 Saturation Range (%): 78.3-99.6%   Time below or at 88% saturation: 8 min   Pulse Rate Statistics:   Pulse Mean (bpm):    66/min    Pulse Range (53-102/min)   Snoring: Intermittent, mild to moderate  IMPRESSION/DIAGNOSES:   OSA (obstructive sleep apnea), moderate    RECOMMENDATIONS:   This home sleep  test demonstrates moderate obstructive sleep apnea with a total AHI of 24.5/hour and O2 nadir of 78.3%.  Intermittent, mild to moderate snoring was detected. Treatment with a positive airway pressure (PAP) device is recommended. The patient will be advised to proceed with an autoPAP titration/trial at home for now. A full night titration study may be considered to optimize treatment settings, monitor proper oxygen saturations and aid with improvement of tolerance and adherence, if needed down the road. Alternative treatment options may include a dental device through dentistry or orthodontics in selected patients or Inspire (hypoglossal nerve stimulator) in carefully selected patients (meeting inclusion criteria).  Concomitant weight loss is recommended (where clinically appropriate). Please note that untreated obstructive sleep apnea may carry additional perioperative morbidity. Patients with significant obstructive sleep apnea should receive perioperative PAP therapy and the surgeons and particularly the anesthesiologist should be informed of the diagnosis and the severity of the sleep disordered breathing. The patient should be cautioned not to drive, work at heights, or operate dangerous or heavy equipment when tired or sleepy. Review and reiteration of good sleep hygiene measures should be pursued with any patient. Other causes of the patient's symptoms, including circadian rhythm disturbances, an underlying mood disorder, medication effect and/or an underlying medical problem cannot be ruled out based on this test. Clinical correlation is recommended.  The patient and his referring provider will be notified of the test results. The patient will be seen in follow up in sleep clinic at [facility].  I certify that  I have reviewed the raw data recording prior to the issuance of this report in accordance with the standards of the American Academy of Sleep Medicine (AASM).    INTERPRETING PHYSICIAN:   True Mar, MD, PhD Medical Director, [facility]) Diplomat, ABPN (Neurology and Sleep)   [facility] [address] [place], [place] [address] [phone]

## 2024-02-05 NOTE — Telephone Encounter (Signed)
-----   Message from True Mar sent at 02/01/2024  1:14 PM EDT ----- Patient referred by Dr. Stevie, seen by me on 12/20/2023, patient had a HST on 01/18/2024.    Please call and notify the patient that the recent home sleep test showed obstructive sleep apnea in the moderate range. I recommend treatment in the form of autoPAP, which means, that we don't have  to bring him in for a sleep study with CPAP, but will let him start using a so called autoPAP machine at home, which is a CPAP-like machine with self-adjusting pressures. We will send the order to a  local DME company (of his choice, or as per insurance requirement). The DME representative will fit him with a mask, educate him on how to use the machine, how to put the mask on, etc. I have placed  an order in the chart. Please send the order, talk to patient, send report to referring MD. We will need a FU in sleep clinic for 10 weeks post-PAP set up, please arrange that with me or one of our  NPs. Also reinforce the need for compliance with treatment. Thanks,   True Mar, MD, PhD [facility])    ----- Message ----- From: Mar True, MD Sent: 02/01/2024   1:12 PM EDT To: True Mar, MD

## 2024-02-05 NOTE — Telephone Encounter (Signed)
 Lmvm for pt to return call for sleep study results.

## 2024-02-05 NOTE — Telephone Encounter (Signed)
 Spoke to patient gave sleep study results Pt chose [employer]  as DME Pt aware of insurance compliance Gave pt [employer] # Gave patient f/u  appointment with Amy,NP 04/2024 Pt states having bariatric surgery August 2025 Will send [employer] orders this afternoon and forward sleep study results to PCP Pt expressed understanding and thanked me for calling

## 2024-02-05 NOTE — Telephone Encounter (Signed)
 Pt called  , returing call informed PT that  he will get callback

## 2024-02-19 ENCOUNTER — Other Ambulatory Visit: Payer: Self-pay

## 2024-02-19 ENCOUNTER — Other Ambulatory Visit

## 2024-02-19 DIAGNOSIS — E118 Type 2 diabetes mellitus with unspecified complications: Secondary | ICD-10-CM

## 2024-02-20 ENCOUNTER — Ambulatory Visit: Payer: Self-pay | Admitting: Endocrinology

## 2024-02-20 LAB — BASIC METABOLIC PANEL WITH GFR
BUN/Creatinine Ratio: 27 (calc) — ABNORMAL HIGH (ref 6–22)
BUN: 31 mg/dL — ABNORMAL HIGH (ref 7–25)
CO2: 23 mmol/L (ref 20–32)
Calcium: 9.9 mg/dL (ref 8.6–10.3)
Chloride: 101 mmol/L (ref 98–110)
Creat: 1.13 mg/dL (ref 0.70–1.30)
Glucose, Bld: 258 mg/dL — ABNORMAL HIGH (ref 65–99)
Potassium: 4.3 mmol/L (ref 3.5–5.3)
Sodium: 134 mmol/L — ABNORMAL LOW (ref 135–146)
eGFR: 77 mL/min/1.73m2 (ref >=60)

## 2024-02-20 LAB — LIPID PANEL
Cholesterol: 326 mg/dL — ABNORMAL HIGH (ref <200)
HDL: 52 mg/dL (ref >=40)
LDL Cholesterol (Calc): 224 mg/dL — ABNORMAL HIGH
Non-HDL Cholesterol (Calc): 274 mg/dL — ABNORMAL HIGH (ref <130)
Total CHOL/HDL Ratio: 6.3 (calc) — ABNORMAL HIGH (ref <5.0)
Triglycerides: 293 mg/dL — ABNORMAL HIGH (ref <150)

## 2024-02-20 LAB — MICROALBUMIN / CREATININE URINE RATIO
Creatinine, Urine: 51 mg/dL (ref 20–320)
Microalb Creat Ratio: 859 mg/g{creat} — ABNORMAL HIGH (ref <30)
Microalb, Ur: 43.8 mg/dL

## 2024-02-20 LAB — HEMOGLOBIN A1C: Hgb A1c MFr Bld: >14 % — ABNORMAL HIGH (ref <5.7)

## 2024-02-22 ENCOUNTER — Ambulatory Visit: Admitting: Endocrinology

## 2024-02-25 ENCOUNTER — Ambulatory Visit: Payer: 59 | Admitting: Family Medicine

## 2024-02-25 ENCOUNTER — Encounter: Payer: Self-pay | Admitting: Family Medicine

## 2024-02-25 VITALS — BP 110/78 | HR 84 | Ht 72.0 in | Wt 326.1 lb

## 2024-02-25 DIAGNOSIS — E7800 Pure hypercholesterolemia, unspecified: Secondary | ICD-10-CM

## 2024-02-25 DIAGNOSIS — I10 Essential (primary) hypertension: Secondary | ICD-10-CM | POA: Diagnosis not present

## 2024-02-25 MED ORDER — CARBAMAZEPINE 200 MG PO TABS: 200.0000 mg | ORAL_TABLET | Freq: Two times a day (BID) | ORAL | 1 refills | Status: AC

## 2024-02-25 NOTE — Patient Instructions (Signed)
 Follow up in 3 months to recheck cholesterol No need to repeat labs today Continue to work on healthy diet and regular exercise- you can do it! Call with any questions or concerns Stay Safe!  Stay Healthy! HAPPY BIRTHDAY!!!

## 2024-02-25 NOTE — Assessment & Plan Note (Signed)
 Ongoing issue.  Pt is down 10 lbs since May but this could be due to uncontrolled DM.  BMI 44.23.  He has appt w/ Endo tomorrow.  Plan is for gastric sleeve in September but I suspect they will want much better sugar control before operating.

## 2024-02-25 NOTE — Progress Notes (Signed)
   Subjective:    Patient ID: Gregory Fitzgerald, male    DOB: 01-15-1966, 59 y.o.   MRN: [id number]  HPI HTN- chronic problem, on Diltiazem  120mg  daily, Lisinopril  40mg  daily w/ good control.  Last K+ normal at 4.3, Cr 1.13.  No CP, SOB, HA's, visual changes, edema.  Hyperlipidemia- chronic problem, on Zetia  10mg  daily, Fluvastatin  XL 80mg  daily.  Total cholesterol on 7/29 326, LDL 224.  Denies abd pain, N/V.  Obesity- pt is down 10 lbs since May.  BMI now 44.23.  Pt is planning for gastric sleeve in September.     Review of Systems For ROS see HPI     Objective:   Physical Exam Vitals reviewed.  Constitutional:      General: He is not in acute distress.    Appearance: Normal appearance. He is well-developed. He is obese. He is not ill-appearing.  HENT:     Head: Normocephalic and atraumatic.  Eyes:     Extraocular Movements: Extraocular movements intact.     Conjunctiva/sclera: Conjunctivae normal.     Pupils: Pupils are equal, round, and reactive to light.  Neck:     Thyroid : No thyromegaly.  Cardiovascular:     Rate and Rhythm: Normal rate and regular rhythm.     Pulses: Normal pulses.     Heart sounds: Normal heart sounds. No murmur heard. Pulmonary:     Effort: Pulmonary effort is normal. No respiratory distress.     Breath sounds: Normal breath sounds.  Abdominal:     General: Bowel sounds are normal. There is no distension.     Palpations: Abdomen is soft.  Musculoskeletal:     Cervical back: Normal range of motion and neck supple.     Right lower leg: No edema.     Left lower leg: No edema.  Lymphadenopathy:     Cervical: No cervical adenopathy.  Skin:    General: Skin is warm and dry.  Neurological:     General: No focal deficit present.     Mental Status: He is alert and oriented to person, place, and time.     Cranial Nerves: No cranial nerve deficit.  Psychiatric:        Mood and Affect: Mood normal.        Behavior: Behavior normal.            Assessment & Plan:

## 2024-02-25 NOTE — Assessment & Plan Note (Signed)
 Chronic problem.  Well controlled on Dilt and Lisinopril .  Labs were all normal last week- no need to repeat.  No changes at this time.

## 2024-02-25 NOTE — Assessment & Plan Note (Signed)
 Pt was not fasting when he had labs drawn last week.  Total cholesterol 326 and LDL 224.  Currently on Zetia  10mg  daily and Fluvastatin  XL 80mg  daily.  Since Fluvastatin  is the least potent statin, would strongly consider switching to Crestor  or Lipitor.  I am not the one who started this medication so will defer to Endo appt tomorrow regarding possible change.  If no change is discussed at tomorrow's appt- will contact pt regarding a switch.

## 2024-02-26 ENCOUNTER — Encounter: Payer: Self-pay | Admitting: Endocrinology

## 2024-02-26 ENCOUNTER — Ambulatory Visit: Admitting: Endocrinology

## 2024-02-26 VITALS — BP 132/80 | HR 98 | Resp 20 | Ht 72.0 in | Wt 327.2 lb

## 2024-02-26 DIAGNOSIS — Z794 Long term (current) use of insulin: Secondary | ICD-10-CM | POA: Diagnosis not present

## 2024-02-26 DIAGNOSIS — Z7984 Long term (current) use of oral hypoglycemic drugs: Secondary | ICD-10-CM

## 2024-02-26 DIAGNOSIS — E1165 Type 2 diabetes mellitus with hyperglycemia: Secondary | ICD-10-CM

## 2024-02-26 MED ORDER — GLIMEPIRIDE 2 MG PO TABS: 2.0000 mg | ORAL_TABLET | Freq: Every day | ORAL | 3 refills | Status: DC

## 2024-02-26 MED ORDER — INSULIN PEN NEEDLE 32G X 4 MM MISC
3 refills | Status: DC
Start: 2024-02-26 — End: 2024-04-22

## 2024-02-26 MED ORDER — LANTUS SOLOSTAR 100 UNIT/ML ~~LOC~~ SOPN: 30.0000 [IU] | PEN_INJECTOR | Freq: Every day | SUBCUTANEOUS | 3 refills | Status: DC

## 2024-02-26 NOTE — Patient Instructions (Addendum)
 Start lantus  30 units daily. Start glimepiride  2mg  daily. Continue Xiguo same.

## 2024-02-26 NOTE — Progress Notes (Signed)
 Outpatient Endocrinology Note [name], MD  02/26/24  Patient's Name: Gregory Fitzgerald    DOB: 1965-10-16    MRN: [id number]                                                    REASON OF VISIT: Follow up for type 2 diabetes mellitus  PCP: Mahlon Comer BRAVO, MD  HISTORY OF PRESENT ILLNESS:   Gregory Fitzgerald is a 58 y.o. old male with past medical history listed below, is here for follow up for type 2 diabetes mellitus.    Pertinent Diabetes History: Patient was previously seen by Dr. Von and was last time seen in July 2024.  Patient was diagnosed with type 2 diabetes mellitus in 2006.  No personal history of pancreatitis and / or family history of medullary thyroid  carcinoma or MEN 2B syndrome.   Chronic Diabetes Complications : Retinopathy: no. Last ophthalmology exam was done on annually, following with ophthalmology regularly.  Nephropathy: Microalbuminuria present, on ACE/ARB /lisinopril  and dapagliflozin . Peripheral neuropathy: no Coronary artery disease: no Stroke: no  Relevant comorbidities and cardiovascular risk factors: Obesity: yes Body mass index is 44.38 kg/m.  Hypertension: Yes  Hyperlipidemia : Yes, on statin   Treated with Lipitor 20 mg previously but this was stopped because of leg stiffness; also may have had some side effects of leg stiffness from Crestor     He has no leg stiffness or aching with Lescol  XL, taking 80 mg   Current / Home Diabetic regimen includes: Xigduo  XR 11/998 mg, 2 tablets daily,   Prior diabetic medications: He used to be on glimepiride , Victoza , Invokana  in the past.  There was plan to start basal insulin  in June 2024 however he never started.  Glycemic data:   He has been using freestyle libre glucometer.  Patient has a different test strip and has not been checking blood sugar lately.  No glucose data reviewed.  Hypoglycemia: Patient has no hypoglycemic episodes. Patient has hypoglycemia awareness.  Factors modifying  glucose control: 1.  Diabetic diet assessment: 3 meals a day.  2.  Staying active or exercising: Exercising in the gym 4 days a week.  3.  Medication compliance: compliant all of the time.  Interval history  Patient had controlled diabetes and last visit in April with hemoglobin A1c of 5.9%.  He has significant worsening of diabetes control with hemoglobin A1c more than 14% at this time.  Patient had seen bariatric surgery and plan for gastric sleeve gastrectomy next month.  Patient reports he was asked to stop Ozempic  and Mounjaro  has no longer been taking this medication for at least 3 months.  He reports he is also not controlling with diet and probably eating high carbohydrate meals, he decided to eat what he wants prior to surgery, everything contributing to possible uncontrolled diabetes mellitus at this time.  He reports he has been taking Xigduo  regularly he has not been checking blood sugar lately and no glucose data to review at this time.  Patient lab results reviewed.  He has elevated cholesterol level.  Not fully compliant medications.  Elevated urine microalbumin creatinine ratio.  REVIEW OF SYSTEMS As per history of present illness.   PAST MEDICAL HISTORY: Past Medical History:  Diagnosis Date   Arthritis    knees   Diabetes (HCC)  GERD (gastroesophageal reflux disease)    years ago, not currently    Hyperlipidemia    Hypertension    Trigeminal neuralgia     PAST SURGICAL HISTORY: Past Surgical History:  Procedure Laterality Date   club foot surgery     age 64 months   KNEE SURGERY     x 2 in high school     ALLERGIES: No Known Allergies  FAMILY HISTORY:  Family History  Problem Relation Age of Onset   Diabetes Mother    Diabetes Father    Lymphoma Father    Colon cancer Neg Hx    Colon polyps Neg Hx    Esophageal cancer Neg Hx    Rectal cancer Neg Hx    Stomach cancer Neg Hx     SOCIAL HISTORY: Social History   Socioeconomic History   Marital  status: Married    Spouse name: Not on file   Number of children: Not on file   Years of education: Not on file   Highest education level: Some college, no degree  Occupational History   Not on file  Tobacco Use   Smoking status: Never   Smokeless tobacco: Never  Vaping Use   Vaping status: Never Used  Substance and Sexual Activity   Alcohol use: Not Currently    Comment: occasionally    Drug use: Never   Sexual activity: Not on file  Other Topics Concern   Not on file  Social History Narrative   Not on file   Social Drivers of Health   Financial Resource Strain: Low Risk  (02/24/2024)   Overall Financial Resource Strain (CARDIA)    Difficulty of Paying Living Expenses: Not very hard  Food Insecurity: No Food Insecurity (02/24/2024)   Hunger Vital Sign    Worried About Running Out of Food in the Last Year: Never true    Ran Out of Food in the Last Year: Never true  Transportation Needs: No Transportation Needs (02/24/2024)   PRAPARE - Administrator, Civil Service (Medical): No    Lack of Transportation (Non-Medical): No  Physical Activity: Sufficiently Active (02/24/2024)   Exercise Vital Sign    Days of Exercise per Week: 3 days    Minutes of Exercise per Session: 60 min  Stress: No Stress Concern Present (02/24/2024)   [employer] of Occupational Health - Occupational Stress Questionnaire    Feeling of Stress: Not at all  Social Connections: Moderately Integrated (02/24/2024)   Social Connection and Isolation Panel    Frequency of Communication with Friends and Family: Three times a week    Frequency of Social Gatherings with Friends and Family: Once a week    Attends Religious Services: More than 4 times per year    Active Member of [employer] or Organizations: No    Attends Engineer, structural: Not on file    Marital Status: Married    MEDICATIONS:  Current Outpatient Medications  Medication Sig Dispense Refill   aspirin 81 MG tablet Take 81 mg by  mouth daily.     blood glucose meter kit and supplies KIT Dispense based on patient and insurance preference. Use up to two times daily as directed. 1 each 0   Blood Glucose Monitoring Suppl (ACCU-CHEK AVIVA PLUS) w/Device KIT Use to check blood sugar twice a daily. 1 kit 0   carbamazepine  (TEGRETOL ) 200 MG tablet Take 1 tablet (200 mg total) by mouth 2 (two) times daily. 180 tablet 1  clotrimazole -betamethasone  (LOTRISONE ) cream APPLY TO AFFECTED AREA TWICE A DAY 45 g 0   Dapagliflozin  Pro-metFORMIN  ER (XIGDUO  XR) 11-998 MG TB24 Take 2 tablets by mouth daily. 180 tablet 3   diltiazem  (CARDIZEM  CD) 120 MG 24 hr capsule TAKE 1 CAPSULE BY MOUTH EVERY DAY 90 capsule 1   ezetimibe  (ZETIA ) 10 MG tablet Take 1 tablet (10 mg total) by mouth daily. 90 tablet 3   fluvastatin  XL (LESCOL  XL) 80 MG 24 hr tablet Take 1 tablet (80 mg total) by mouth daily. 90 tablet 3   glimepiride  (AMARYL ) 2 MG tablet Take 1 tablet (2 mg total) by mouth daily before breakfast. 90 tablet 3   Glucose Blood (BLOOD GLUCOSE TEST STRIPS) STRP 1 each by In Vitro route daily. May substitute to any manufacturer covered by patient's insurance. 100 each 3   glucose blood (FREESTYLE LITE) test strip USE AS INSTRUCTED TWICE A DAY 100 strip 3   insulin  glargine (LANTUS  SOLOSTAR) 100 UNIT/ML Solostar Pen Inject 30 Units into the skin daily. 15 mL 3   Insulin  Pen Needle (NOVOFINE) 32G X 6 MM MISC Use 1 per day to inject insulin  50 each 5   Insulin  Pen Needle 32G X 4 MM MISC Use 4x a day 300 each 3   Lancets Misc. MISC 1 each by Does not apply route daily. May substitute to any manufacturer covered by patient's insurance. 100 each 3   lisinopril  (ZESTRIL ) 40 MG tablet Take 1 tablet (40 mg total) by mouth daily. 90 tablet 3   meloxicam (MOBIC) 15 MG tablet Mobic 15 mg tablet  Take 1 tablet every day by oral route as needed for 30 days.  take with meals     tadalafil  (CIALIS ) 20 MG tablet TAKE 1 TABLET EVERY DAY AS NEEDED FOR ERECTILE  DYSFUNCTION 10 tablet 2   No current facility-administered medications for this visit.    PHYSICAL EXAM: Vitals:   02/26/24 1449  BP: 132/80  Pulse: 98  Resp: 20  SpO2: 95%  Weight: (!) 327 lb 3.2 oz (148.4 kg)  Height: 6' (1.829 m)     Body mass index is 44.38 kg/m.  Wt Readings from Last 3 Encounters:  02/26/24 (!) 327 lb 3.2 oz (148.4 kg)  02/25/24 (!) 326 lb 2 oz (147.9 kg)  12/20/23 (!) 336 lb (152.4 kg)    General: Well developed, well nourished male in no apparent distress.  HEENT: AT/[place], no external lesions.  Eyes: Conjunctiva clear and no icterus. Neck: Neck supple  Lungs: Respirations not labored Neurologic: Alert, oriented, normal speech Extremities / Skin: Dry.   Psychiatric: Does not appear depressed or anxious  Diabetic Foot Exam - Simple   No data filed    LABS Reviewed Lab Results  Component Value Date   HGBA1C >14.0 (H) 02/19/2024   HGBA1C 5.9 (A) 10/16/2023   HGBA1C 6.6 (H) 06/08/2023   Lab Results  Component Value Date   FRUCTOSAMINE 311 (H) 01/04/2023   FRUCTOSAMINE 403 (H) 03/08/2020   FRUCTOSAMINE 239 12/19/2017   Lab Results  Component Value Date   CHOL 326 (H) 02/19/2024   HDL 52 02/19/2024   LDLCALC 224 (H) 02/19/2024   LDLDIRECT 113.0 11/03/2022   TRIG 293 (H) 02/19/2024   CHOLHDL 6.3 (H) 02/19/2024   Lab Results  Component Value Date   MICRALBCREAT 859 (H) 02/19/2024   Lab Results  Component Value Date   CREATININE 1.13 02/19/2024   Lab Results  Component Value Date   GFR 77.27 08/24/2023  ASSESSMENT / PLAN  1. Uncontrolled type 2 diabetes mellitus with hyperglycemia, without long-term current use of insulin  (HCC)     Diabetes Mellitus type 2, complicated by microalbuminuria. - Diabetic status / severity: Controlled.  Lab Results  Component Value Date   HGBA1C >14.0 (H) 02/19/2024    - Hemoglobin A1c goal : <6.5% Significant worsening of diabetes control.  He stopped taking Ozempic  or Mounjaro .  He is  also not following diabetic diet at this time.  Patient is asked to talk with bariatric surgery if it is okay to resume Mounjaro  or Ozempic  until gastric bypass surgery is done.  Adjusted diabetes regimen as follows.  Started basal insulin  for better diabetes control.   - Medications: See below  I) continue Xigduo  XR 11/998 mg 2 tablets daily. II) start Lantus  30 units daily. III) start glimepiride  2 mg daily.  Discussed that the goal blood sugar in the morning fasting should be below 150 and blood sugar after meal around bedtime can be around 200.  If blood sugar is not at goal after clinic to adjust diabetes regimen.  Will reevaluate his diabetes control after planned bariatric surgery in September.  - Home glucose testing: advised to monitor blood sugar at least 2-3 times a day.  - Discussed/ Gave Hypoglycemia treatment plan.  # Consult : not required at this time.   # Annual urine for microalbuminuria/ creatinine ratio,  microalbuminuria currently, continue ACE/ARB /lisinopril  and dapagliflozin .  Elevated urine microalbumin creatinine ratio likely related to uncontrolled diabetes mellitus.  Last  Lab Results  Component Value Date   MICRALBCREAT 859 (H) 02/19/2024    # Foot check nightly.  # Annual dilated diabetic eye exams.   - Diet: Make healthy diabetic food choices - Life style / activity / exercise: Discussed.  2. Blood pressure  -  BP Readings from Last 1 Encounters:  02/26/24 132/80    - Control is in target.  - No change in current plans.  3. Lipid status / Hyperlipidemia - Last  Lab Results  Component Value Date   LDLCALC 224 (H) 02/19/2024   - Continue Escol XL, taking 80 mg daily.  Zetia  10 mg daily.  Advised for compliance with these medications.  # Morbid obesity BMI 44 - He has been following with bariatric surgery, reports has a plan for bariatric surgery next month in September.  Diagnoses and all orders for this visit:  Uncontrolled  type 2 diabetes mellitus with hyperglycemia, without long-term current use of insulin  (HCC) -     insulin  glargine (LANTUS  SOLOSTAR) 100 UNIT/ML Solostar Pen; Inject 30 Units into the skin daily. -     Insulin  Pen Needle 32G X 4 MM MISC; Use 4x a day -     glimepiride  (AMARYL ) 2 MG tablet; Take 1 tablet (2 mg total) by mouth daily before breakfast.    DISPOSITION Follow up in clinic in 2 months suggested.   All questions answered and patient verbalized understanding of the plan.  [place] Anaka Beazer, MD [facility] [address] [place], [address] [place], [place] [address] Phone # [phone]  At least part of this note was generated using voice recognition software. Inadvertent word errors may have occurred, which were not recognized during the proofreading process.

## 2024-03-17 ENCOUNTER — Encounter: Payer: Self-pay | Admitting: Skilled Nursing Facility1

## 2024-03-17 ENCOUNTER — Encounter: Attending: General Surgery | Admitting: Skilled Nursing Facility1

## 2024-03-17 VITALS — Wt 340.6 lb

## 2024-03-17 DIAGNOSIS — E119 Type 2 diabetes mellitus without complications: Secondary | ICD-10-CM | POA: Diagnosis present

## 2024-03-17 NOTE — Progress Notes (Signed)
 Pre-Operative Nutrition Class:    Patient was seen on 03/17/2024 for Pre-Operative Bariatric Surgery Education at [facility].    Surgery date:  Surgery type: Sleeve Start weight at [facility]: 333.4 Weight today: 340.6    The following the learning objectives were met by the patient during this course: Identify Pre-Op Dietary Goals and will begin 2 weeks pre-operatively Identify appropriate sources of fluids and proteins  State protein recommendations and appropriate sources pre and post-operatively Identify Post-Operative Dietary Goals and will follow for 2 weeks post-operatively Identify appropriate multivitamin and calcium  sources Describe the need for physical activity post-operatively and will follow MD recommendations State when to call healthcare provider regarding medication questions or post-operative complications When having a diagnosis of diabetes understanding hypoglycemia symptoms and the inclusion of 1 complex carbohydrate per meal  Handouts given during class include: Pre-Op Bariatric Surgery Diet Handout Protein Shake Handout Post-Op Bariatric Surgery Nutrition Handout BELT Program Information Flyer Support Group Information Flyer [facility] Bariatric Supplements Price List  Follow-Up Plan: Patient will follow-up at [facility] 2 weeks post operatively for diet advancement per MD.

## 2024-04-09 NOTE — Progress Notes (Signed)
 Sent message, via epic in basket, requesting orders in epic from Careers adviser.

## 2024-04-14 NOTE — Progress Notes (Signed)
 Second request for pre op orders: Spoke with Sari.

## 2024-04-15 ENCOUNTER — Ambulatory Visit: Payer: Self-pay | Admitting: General Surgery

## 2024-04-15 NOTE — Progress Notes (Signed)
 Please place orders for PAT appointment scheduled 04/16/24.

## 2024-04-15 NOTE — Patient Instructions (Signed)
 SURGICAL WAITING ROOM VISITATION  Patients having surgery or a procedure may have no more than 2 support people in the waiting area - these visitors may rotate.    Children under the age of 13 must have an adult with them who is not the patient.  Visitors with respiratory illnesses are discouraged from visiting and should remain at home.  If the patient needs to stay at the hospital during part of their recovery, the visitor guidelines for inpatient rooms apply. Pre-op nurse will coordinate an appropriate time for 1 support person to accompany patient in pre-op.  This support person may not rotate.    Please refer to the [facility] website for the visitor guidelines for Inpatients (after your surgery is over and you are in a regular room).    Your procedure is scheduled on: 04/21/24   Report to [facility] Main Entrance    Report to admitting at 7:45 AM   Call this number if you have problems the morning of surgery [phone]   MORNING OF SURGERY DRINK:   DRINK 1 G2 drink BEFORE YOU LEAVE HOME, DRINK ALL OF THE  G2 DRINK AT ONE TIME.   NO SOLID FOOD AFTER 600 PM THE NIGHT BEFORE YOUR SURGERY. YOU MAY DRINK CLEAR FLUIDS. THE G2 DRINK YOU DRINK BEFORE YOU LEAVE HOME WILL BE THE LAST FLUIDS YOU DRINK BEFORE SURGERY.  PAIN IS EXPECTED AFTER SURGERY AND WILL NOT BE COMPLETELY ELIMINATED. AMBULATION AND TYLENOL WILL HELP REDUCE INCISIONAL AND GAS PAIN. MOVEMENT IS KEY!  YOU ARE EXPECTED TO BE OUT OF BED WITHIN 4 HOURS OF ADMISSION TO YOUR PATIENT ROOM.  SITTING IN THE RECLINER THROUGHOUT THE DAY IS IMPORTANT FOR DRINKING FLUIDS AND MOVING GAS THROUGHOUT THE GI TRACT.  COMPRESSION STOCKINGS SHOULD BE WORN [facility] STAY UNLESS YOU ARE WALKING.   INCENTIVE SPIROMETER SHOULD BE USED EVERY HOUR WHILE AWAKE TO DECREASE POST-OPERATIVE COMPLICATIONS SUCH AS PNEUMONIA.  WHEN DISCHARGED HOME, IT IS IMPORTANT TO CONTINUE TO WALK EVERY HOUR AND USE THE INCENTIVE SPIROMETER  EVERY HOUR.    You may have the following liquids until 7:00 AM DAY OF SURGERY  Water Non-Citrus Juices (without pulp, NO RED-Apple, White grape, White cranberry) Black Coffee (NO MILK/CREAM OR CREAMERS, sugar ok)  Clear Tea (NO MILK/CREAM OR CREAMERS, sugar ok) regular and decaf                             Plain Jell-O (NO RED)                                           Fruit ices (not with fruit pulp, NO RED)                                     Popsicles (NO RED)                                                               Sports drinks like Gatorade (NO RED)  The day of surgery:  Drink ONE (1) Pre-Surgery G2 at 7:00 AM the morning of surgery. Drink in one sitting. Do not sip.  This drink was given to you during your hospital  pre-op appointment visit. Nothing else to drink after completing the  Pre-Surgery G2.          If you have questions, please contact your surgeon's office.   FOLLOW BOWEL PREP AND ANY ADDITIONAL PRE OP INSTRUCTIONS YOU RECEIVED FROM YOUR SURGEON'S OFFICE!!!     Oral Hygiene is also important to reduce your risk of infection.                                    Remember - BRUSH YOUR TEETH THE MORNING OF SURGERY WITH YOUR REGULAR TOOTHPASTE  DENTURES WILL BE REMOVED PRIOR TO SURGERY PLEASE DO NOT APPLY Poly grip OR ADHESIVES!!!   Stop all vitamins and herbal supplements 7 days before surgery.   Take these medicines the morning of surgery with A SIP OF WATER: Carbamazepine , Zetia   DO NOT TAKE ANY ORAL DIABETIC MEDICATIONS DAY OF YOUR SURGERY  How to Manage Your Diabetes Before and After Surgery  Why is it important to control my blood sugar before and after surgery? Improving blood sugar levels before and after surgery helps healing and can limit problems. A way of improving blood sugar control is eating a healthy diet by:  Eating less sugar and carbohydrates  Increasing activity/exercise  Talking with your doctor about reaching  your blood sugar goals High blood sugars (greater than 180 mg/dL) can raise your risk of infections and slow your recovery, so you will need to focus on controlling your diabetes during the weeks before surgery. Make sure that the doctor who takes care of your diabetes knows about your planned surgery including the date and location.  How do I manage my blood sugar before surgery? Check your blood sugar at least 4 times a day, starting 2 days before surgery, to make sure that the level is not too high or low. Check your blood sugar the morning of your surgery when you wake up and every 2 hours until you get to the Short Stay unit. If your blood sugar is less than 70 mg/dL, you will need to treat for low blood sugar: Do not take insulin . Treat a low blood sugar (less than 70 mg/dL) with  cup of clear juice (cranberry or apple), 4 glucose tablets, OR glucose gel. Recheck blood sugar in 15 minutes after treatment (to make sure it is greater than 70 mg/dL). If your blood sugar is not greater than 70 mg/dL on recheck, call [phone] for further instructions. Report your blood sugar to the short stay nurse when you get to Short Stay.  If you are admitted to the hospital after surgery: Your blood sugar will be checked by the staff and you will probably be given insulin  after surgery (instead of oral diabetes medicines) to make sure you have good blood sugar levels. The goal for blood sugar control after surgery is 80-180 mg/dL.   WHAT DO I DO ABOUT MY DIABETES MEDICATION?  Do not take oral diabetes medicines (pills) the morning of surgery.  Hold Xigduo  for 3 days prior to surgery. Last dose 04/17/24.  THE DAY BEFORE SURGERY, take Morning dose of Glimepiride , no afternoon or evening dose. Take Lantus  as prescribed.  THE MORNING OF SURGERY, do not take Glimepiride . Take  50% of Lantus   Reviewed and Endorsed by [facility] Patient Education Committee, August 2015  Bring CPAP mask and tubing day  of surgery.                              You may not have any metal on your body including jewelry, and body piercing             Do not wear lotions, powders, cologne, or deodorant              Men may shave face and neck.   Do not bring valuables to the hospital. [place] [facility] IS NOT             RESPONSIBLE   FOR VALUABLES.   Contacts, glasses, dentures or bridgework may not be worn into surgery.   Bring small overnight bag day of surgery.   DO NOT BRING YOUR HOME MEDICATIONS TO THE HOSPITAL. PHARMACY WILL DISPENSE MEDICATIONS LISTED ON YOUR MEDICATION LIST TO YOU DURING YOUR ADMISSION IN THE HOSPITAL!    Special Instructions: Bring a copy of your healthcare power of attorney and living will documents the day of surgery if you haven't scanned them before.              Please read over the following fact sheets you were given: IF YOU HAVE QUESTIONS ABOUT YOUR PRE-OP INSTRUCTIONS PLEASE CALL [phone] [place].   If you received a COVID test during your pre-op visit  it is requested that you wear a mask when out in public, stay away from anyone that may not be feeling well and notify your surgeon if you develop symptoms. If you test positive for Covid or have been in contact with anyone that has tested positive in the last 10 days please notify you surgeon.    [facility] - Preparing for Surgery Before surgery, you can play an important role.  Because skin is not sterile, your skin needs to be as free of germs as possible.  You can reduce the number of germs on your skin by washing with CHG (chlorahexidine gluconate) soap before surgery.  CHG is an antiseptic cleaner which kills germs and bonds with the skin to continue killing germs even after washing. Please DO NOT use if you have an allergy to CHG or antibacterial soaps.  If your skin becomes reddened/irritated stop using the CHG and inform your nurse when you arrive at Short Stay. Do not shave (including legs and underarms) for at  least 48 hours prior to the first CHG shower.  You may shave your face/neck.  Please follow these instructions carefully:  1.  Shower with CHG Soap the night before surgery and the  morning of surgery.  2.  If you choose to wash your hair, wash your hair first as usual with your normal  shampoo.  3.  After you shampoo, rinse your hair and body thoroughly to remove the shampoo.                             4.  Use CHG as you would any other liquid soap.  You can apply chg directly to the skin and wash.  Gently with a scrungie or clean washcloth.  5.  Apply the CHG Soap to your body ONLY FROM THE NECK DOWN.   Do   not use on face/ open  Wound or open sores. Avoid contact with eyes, ears mouth and   genitals (private parts).                       Wash face,  Genitals (private parts) with your normal soap.             6.  Wash thoroughly, paying special attention to the area where your    surgery  will be performed.  7.  Thoroughly rinse your body with warm water from the neck down.  8.  DO NOT shower/wash with your normal soap after using and rinsing off the CHG Soap.                9.  Pat yourself dry with a clean towel.            10.  Wear clean pajamas.            11.  Place clean sheets on your bed the night of your first shower and do not  sleep with pets. Day of Surgery : Do not apply any lotions/deodorants the morning of surgery.  Please wear clean clothes to the hospital/surgery center.  FAILURE TO FOLLOW THESE INSTRUCTIONS MAY RESULT IN THE CANCELLATION OF YOUR SURGERY  PATIENT SIGNATURE_________________________________  NURSE SIGNATURE__________________________________  ________________________________________________________________________

## 2024-04-15 NOTE — Progress Notes (Addendum)
 COVID Vaccine Completed: yes  Date of COVID positive in last 90 days:  PCP - Comer Greet, MD Cardiologist - n/a Endocrinologist- [place] Thapa, MD  Chest x-ray - 12/25/23 Epic EKG - 12/25/23 Epic Stress Test - 20 years ago per pt, was okay per pt ECHO - N/A Cardiac Cath - n/a Pacemaker/ICD device last checked:N/A Spinal Cord Stimulator:N/A  Bowel Prep - no solid food after 6pm night before  Sleep Study - yes CPAP -  using 3 nights a week  Fasting Blood Sugar - 84-155 Checks Blood Sugar 2 times a day  Last dose of GLP1 agonist-  N/A GLP1 instructions:  Do not take after     Last dose of SGLT-2 inhibitors-  Xigduo  SGLT-2 instructions:  Do not take after  04/17/24   Blood Thinner Instructions: N/A Last dose:   Time: Aspirin Instructions: ASA 81, hold 5 days Last Dose:  Activity level: Can go up a flight of stairs and perform activities of daily living without stopping and without symptoms of chest pain or shortness of breath.  Anesthesia review: N/A  Patient denies shortness of breath, fever, cough and chest pain at PAT appointment  Patient verbalized understanding of instructions that were given to them at the PAT appointment. Patient was also instructed that they will need to review over the PAT instructions again at home before surgery.

## 2024-04-16 ENCOUNTER — Encounter (HOSPITAL_COMMUNITY): Payer: Self-pay

## 2024-04-16 ENCOUNTER — Other Ambulatory Visit: Payer: Self-pay

## 2024-04-16 ENCOUNTER — Encounter (HOSPITAL_COMMUNITY)
Admission: RE | Admit: 2024-04-16 | Discharge: 2024-04-16 | Disposition: A | Discharge status: Home / Self Care | Source: Ambulatory Visit | Attending: General Surgery | Admitting: General Surgery

## 2024-04-16 VITALS — BP 131/90 | HR 74 | Temp 98.3°F | Resp 16 | Ht 72.0 in | Wt 339.0 lb

## 2024-04-16 DIAGNOSIS — Z01818 Encounter for other preprocedural examination: Secondary | ICD-10-CM | POA: Diagnosis present

## 2024-04-16 DIAGNOSIS — Z6841 Body Mass Index (BMI) 40.0 and over, adult: Secondary | ICD-10-CM | POA: Diagnosis not present

## 2024-04-16 DIAGNOSIS — E119 Type 2 diabetes mellitus without complications: Secondary | ICD-10-CM | POA: Insufficient documentation

## 2024-04-16 DIAGNOSIS — Z01812 Encounter for preprocedural laboratory examination: Secondary | ICD-10-CM | POA: Diagnosis not present

## 2024-04-16 LAB — CBC WITH DIFFERENTIAL/PLATELET
Abs Immature Granulocytes: 0.05 K/uL (ref 0.00–0.07)
Basophils Absolute: 0 K/uL (ref 0.0–0.1)
Basophils Relative: 1 %
Eosinophils Absolute: 0.3 K/uL (ref 0.0–0.5)
Eosinophils Relative: 4 %
HCT: 42.1 % (ref 39.0–52.0)
Hemoglobin: 14 g/dL (ref 13.0–17.0)
Immature Granulocytes: 1 %
Lymphocytes Relative: 29 %
Lymphs Abs: 2.2 K/uL (ref 0.7–4.0)
MCH: 32.5 pg (ref 26.0–34.0)
MCHC: 33.3 g/dL (ref 30.0–36.0)
MCV: 97.7 fL (ref 80.0–100.0)
Monocytes Absolute: 0.6 K/uL (ref 0.1–1.0)
Monocytes Relative: 9 %
Neutro Abs: 4.3 K/uL (ref 1.7–7.7)
Neutrophils Relative %: 56 %
Platelets: 258 K/uL (ref 150–400)
RBC: 4.31 MIL/uL (ref 4.22–5.81)
RDW: 12.3 % (ref 11.5–15.5)
WBC: 7.5 K/uL (ref 4.0–10.5)
nRBC: 0 % (ref 0.0–0.2)

## 2024-04-16 LAB — HEMOGLOBIN A1C
Hgb A1c MFr Bld: 7.3 % — ABNORMAL HIGH (ref 4.8–5.6)
Mean Plasma Glucose: 162.81 mg/dL

## 2024-04-16 LAB — COMPREHENSIVE METABOLIC PANEL WITH GFR
ALT: 11 U/L (ref 0–44)
AST: 24 U/L (ref 15–41)
Albumin: 3.9 g/dL (ref 3.5–5.0)
Alkaline Phosphatase: 59 U/L (ref 38–126)
Anion gap: 12 (ref 5–15)
BUN: 22 mg/dL — ABNORMAL HIGH (ref 6–20)
CO2: 22 mmol/L (ref 22–32)
Calcium: 9.6 mg/dL (ref 8.9–10.3)
Chloride: 105 mmol/L (ref 98–111)
Creatinine, Ser: 1.17 mg/dL (ref 0.61–1.24)
GFR, Estimated: >60 mL/min (ref >60)
Glucose, Bld: 95 mg/dL (ref 70–99)
Potassium: 4.7 mmol/L (ref 3.5–5.1)
Sodium: 139 mmol/L (ref 135–145)
Total Bilirubin: 0.9 mg/dL (ref 0.0–1.2)
Total Protein: 7.1 g/dL (ref 6.5–8.1)

## 2024-04-16 LAB — GLUCOSE, CAPILLARY: Glucose-Capillary: 98 mg/dL (ref 70–99)

## 2024-04-17 NOTE — Discharge Instructions (Signed)
 GASTRIC BYPASS / SLEEVE  Home Care Instructions  These instructions are to help you care for yourself when you go home.  Call: If you have any problems. Call [phone] and ask for the surgeon on call If you have an emergency related to your surgery please use the ER at [facility].  Tell the ER staff that you are a new post-op gastric bypass or gastric sleeve patient   Signs and symptoms to report: Severe vomiting or nausea If you cannot handle clear liquids for longer than 1 day, call your surgeon  Abdominal pain which does not get better after taking your pain medication Fever greater than 100.4 F and chills Heart rate over 100 beats a minute Trouble breathing Chest pain  Redness, swelling, drainage, or foul odor at incision (surgical) sites  If your incisions open or pull apart Swelling or pain in calf (lower leg) Diarrhea (Loose bowel movements that happen often), frequent watery, uncontrolled bowel movements Constipation, (no bowel movements for 3 days) if this happens:  Take Milk of Magnesia, 2 tablespoons by mouth, 3 times a day for 2 days if needed Stop taking Milk of Magnesia once you have had a bowel movement Call your doctor if constipation continues Or Take Miralax  (instead of Milk of Magnesia) following the label instructions Stop taking Miralax once you have had a bowel movement Call your doctor if constipation continues Anything you think is "abnormal for you"   Normal side effects after surgery: Unable to sleep at night or unable to concentrate Irritability Being tearful (crying) or depressed These are common complaints, possibly related to your anesthesia, stress of surgery and change in lifestyle, that usually go away a few weeks after surgery.  If these feelings continue, call your medical doctor.  Wound Care: You may have surgical glue, steri-strips, or staples over your incisions after surgery Surgical glue:  Looks like a clear film over your incisions  and will wear off a little at a time Steri-strips : Adhesive strips of tape over your incisions. You may notice a yellowish color on the skin under the steri-strips. This is used to make the   steri-strips stick better. Do not pull the steri-strips off - let them fall off Staples: Staples may be removed before you leave the hospital If you go home with staples, call [facility] at for an appointment with your surgeon's nurse to have staples removed 10 days after surgery, [phone] Showering: You may shower two (2) days after your surgery unless your surgeon tells you differently Wash gently around incisions with warm soapy water , rinse well, and gently pat dry  If you have a drain (tube from your incision), you may need someone to hold this while you shower  No tub baths until staples are removed and incisions are healed     Medications: Medications should be liquid or crushed if larger than the size of a dime Extended release pills (medication that releases a little bit at a time through the day) should not be crushed Depending on the size and number of medications you take, you may need to space (take a few throughout the day)/change the time you take your medications so that you do not over-fill your pouch (smaller stomach) Make sure you follow-up with your primary care physician to make medication changes needed during rapid weight loss and life-style changes If you have diabetes, follow up with the doctor that orders your diabetes medication(s) within one week after surgery and check  your blood sugar regularly. Do not drive while taking narcotics (pain medications) DO NOT take NSAID'S (Examples of NSAID's include ibuprofen, naproxen )  Diet:                    First 2 Weeks  You will see the nutritionist about two (2) weeks after your surgery. The nutritionist will increase the types of foods you can eat if you are handling liquids well: If you have severe vomiting or nausea  and cannot handle clear liquids lasting longer than 1 day, call your surgeon  Protein Shake Drink at least 2 ounces of shake 5-6 times per day Each serving of protein shakes (usually 8 - 12 ounces) should have a minimum of:  15 grams of protein  And no more than 5 grams of carbohydrate  Goal for protein each day: Men = 80 grams per day Women = 60 grams per day Protein powder may be added to fluids such as non-fat milk or Lactaid milk or Soy milk (limit to 35 grams added protein powder per serving)  Hydration Slowly increase the amount of water  and other clear liquids as tolerated (See Acceptable Fluids) Slowly increase the amount of protein shake as tolerated   Sip fluids slowly and throughout the day May use sugar substitutes in small amounts (no more than 6 - 8 packets per day; i.e. Splenda)  Fluid Goal The first goal is to drink at least 8 ounces of protein shake/drink per day (or as directed by the nutritionist);  See handout from pre-op Bariatric Education Class for examples of protein shake/drink.   Slowly increase the amount of protein shake you drink as tolerated You may find it easier to slowly sip shakes throughout the day It is important to get your proteins in first Your fluid goal is to drink 64 - 100 ounces of fluid daily It may take a few weeks to build up to this 32 oz (or more) should be clear liquids  And  32 oz (or more) should be full liquids (see below for examples) Liquids should not contain sugar, caffeine, or carbonation  Clear Liquids: Water  or Sugar-free flavored water  (i.e. Fruit H2O, Propel) Decaffeinated coffee or tea (sugar-free) Crystal Lite, Wyler's Lite, Minute Maid Lite Sugar-free Jell-O Bouillon or broth Sugar-free Popsicle:   *Less than 20 calories each; Limit 1 per day  Full Liquids: Protein Shakes/Drinks + 2 choices per day of other full liquids Full liquids must be: No More Than 12 grams of Carbs per serving  No More Than 3 grams of Fat  per serving Strained low-fat cream soup Non-Fat milk Fat-free Lactaid Milk Sugar-free yogurt (Dannon Lite & Fit, Greek yogurt)      Vitamins and Minerals Start 1 day after surgery unless otherwise directed by your surgeon Bariatric Specific Complete Multivitamins Chewable Calcium  Citrate with Vitamin D -3 (Example: 3 Chewable Calcium  Plus 600 with Vitamin D -3) Take 500 mg three (3) times a day for a total of 1500 mg each day Do not take all 3 doses of calcium  at one time as it may cause constipation, and you can only absorb 500 mg  at a time  Do not mix multivitamins containing iron with calcium  supplements; take 2 hours apart  Menstruating women and those at risk for anemia (a blood disease that causes weakness) may need extra iron Talk with your doctor to see if you need more iron If you need extra iron: Total daily Iron recommendation (including Vitamins) is 50 to 100  mg Iron/day Do not stop taking or change any vitamins or minerals until you talk to your nutritionist or surgeon Your nutritionist and/or surgeon must approve all vitamin and mineral supplements   Activity and Exercise: It is important to continue walking at home.  Limit your physical activity as instructed by your doctor.  During this time, use these guidelines: Do not lift anything greater than ten (10) pounds for at least two (2) weeks Do not go back to work or drive until Designer, industrial/product says you can You may have sex when you feel comfortable  It is VERY important for male patients to use a reliable birth control method; fertility often increases after surgery  Do not get pregnant for at least 18 months Start exercising as soon as your doctor tells you that you can Make sure your doctor approves any physical activity Start with a simple walking program Walk 5-15 minutes each day, 7 days per week.  Slowly increase until you are walking 30-45 minutes per day Consider joining our BELT program. [phone] or email  [email or web address]   Special Instructions Things to remember:  Use your CPAP when sleeping if this applies to you, do not stop the use of CPAP unless directed by physician after a sleep study [facility] has a free Bariatric Surgery Support Group that meets monthly, the 3rd Thursday, 6 pm.  Please review discharge information for date and location of this meeting. It is very important to keep all follow up appointments with your surgeon, nutritionist, primary care physician, and behavioral health practitioner After the first year, please follow up with your bariatric surgeon and nutritionist at least once a year in order to maintain best weight loss results   [facility]: [phone] [facility]: [phone] Bariatric Nurse Coordinator: [phone]

## 2024-04-21 ENCOUNTER — Telehealth (HOSPITAL_COMMUNITY): Payer: Self-pay | Admitting: Pharmacy Technician

## 2024-04-21 ENCOUNTER — Ambulatory Visit (HOSPITAL_BASED_OUTPATIENT_CLINIC_OR_DEPARTMENT_OTHER): Admitting: Certified Registered Nurse Anesthetist

## 2024-04-21 ENCOUNTER — Other Ambulatory Visit: Payer: Self-pay

## 2024-04-21 ENCOUNTER — Observation Stay (HOSPITAL_COMMUNITY)
Admission: RE | Admit: 2024-04-21 | Discharge: 2024-04-22 | Disposition: A | Discharge status: Home / Self Care | Attending: General Surgery | Admitting: General Surgery

## 2024-04-21 ENCOUNTER — Other Ambulatory Visit (HOSPITAL_COMMUNITY): Payer: Self-pay

## 2024-04-21 ENCOUNTER — Encounter (HOSPITAL_COMMUNITY): Payer: Self-pay | Admitting: General Surgery

## 2024-04-21 ENCOUNTER — Encounter (HOSPITAL_COMMUNITY)
Admission: RE | Disposition: A | Discharge status: Home / Self Care | Payer: Self-pay | Source: Home / Self Care | Attending: General Surgery

## 2024-04-21 ENCOUNTER — Ambulatory Visit (HOSPITAL_COMMUNITY): Payer: Self-pay | Admitting: Physician Assistant

## 2024-04-21 DIAGNOSIS — Z6841 Body Mass Index (BMI) 40.0 and over, adult: Secondary | ICD-10-CM

## 2024-04-21 DIAGNOSIS — E119 Type 2 diabetes mellitus without complications: Secondary | ICD-10-CM | POA: Insufficient documentation

## 2024-04-21 DIAGNOSIS — Z794 Long term (current) use of insulin: Secondary | ICD-10-CM | POA: Diagnosis not present

## 2024-04-21 DIAGNOSIS — Z7984 Long term (current) use of oral hypoglycemic drugs: Secondary | ICD-10-CM | POA: Insufficient documentation

## 2024-04-21 DIAGNOSIS — R634 Abnormal weight loss: Secondary | ICD-10-CM | POA: Diagnosis present

## 2024-04-21 DIAGNOSIS — I10 Essential (primary) hypertension: Secondary | ICD-10-CM | POA: Diagnosis not present

## 2024-04-21 DIAGNOSIS — G4733 Obstructive sleep apnea (adult) (pediatric): Secondary | ICD-10-CM | POA: Insufficient documentation

## 2024-04-21 DIAGNOSIS — Z79899 Other long term (current) drug therapy: Secondary | ICD-10-CM | POA: Diagnosis not present

## 2024-04-21 HISTORY — PX: LAPAROSCOPIC GASTRIC SLEEVE RESECTION: SHX5895

## 2024-04-21 HISTORY — PX: UPPER GI ENDOSCOPY: SHX6162

## 2024-04-21 LAB — BASIC METABOLIC PANEL WITH GFR
Anion gap: 13 (ref 5–15)
BUN: 17 mg/dL (ref 6–20)
CO2: 20 mmol/L — ABNORMAL LOW (ref 22–32)
Calcium: 9.1 mg/dL (ref 8.9–10.3)
Chloride: 108 mmol/L (ref 98–111)
Creatinine, Ser: 0.94 mg/dL (ref 0.61–1.24)
GFR, Estimated: >60 mL/min (ref >60)
Glucose, Bld: 157 mg/dL — ABNORMAL HIGH (ref 70–99)
Potassium: 4.4 mmol/L (ref 3.5–5.1)
Sodium: 140 mmol/L (ref 135–145)

## 2024-04-21 LAB — TYPE AND SCREEN
ABO/RH(D): B POS
Antibody Screen: NEGATIVE

## 2024-04-21 LAB — CBC
HCT: 39.2 % (ref 39.0–52.0)
Hemoglobin: 13 g/dL (ref 13.0–17.0)
MCH: 32.7 pg (ref 26.0–34.0)
MCHC: 33.2 g/dL (ref 30.0–36.0)
MCV: 98.7 fL (ref 80.0–100.0)
Platelets: 244 K/uL (ref 150–400)
RBC: 3.97 MIL/uL — ABNORMAL LOW (ref 4.22–5.81)
RDW: 12.9 % (ref 11.5–15.5)
WBC: 9.5 K/uL (ref 4.0–10.5)
nRBC: 0 % (ref 0.0–0.2)

## 2024-04-21 LAB — HEMOGLOBIN AND HEMATOCRIT, BLOOD
HCT: 40.3 % (ref 39.0–52.0)
Hemoglobin: 13.2 g/dL (ref 13.0–17.0)

## 2024-04-21 LAB — ABO/RH: ABO/RH(D): B POS

## 2024-04-21 LAB — GLUCOSE, CAPILLARY
Glucose-Capillary: 144 mg/dL — ABNORMAL HIGH (ref 70–99)
Glucose-Capillary: 118 mg/dL — ABNORMAL HIGH (ref 70–99)
Glucose-Capillary: 126 mg/dL — ABNORMAL HIGH (ref 70–99)

## 2024-04-21 SURGERY — GASTRECTOMY, SLEEVE, LAPAROSCOPIC
Anesthesia: General

## 2024-04-21 MED ORDER — 0.9 % SODIUM CHLORIDE (POUR BTL) OPTIME
TOPICAL | Status: DC | PRN
Start: 2024-04-21 — End: 2024-04-21
  Administered 2024-04-21: 1000 mL

## 2024-04-21 MED ORDER — INSULIN ASPART 100 UNIT/ML IJ SOLN
0.0000 [IU] | INTRAMUSCULAR | Status: DC | PRN
  Administered 2024-04-21: 2 [IU] via SUBCUTANEOUS

## 2024-04-21 MED ORDER — ALBUMIN HUMAN 5 % IV SOLN
INTRAVENOUS | Status: AC
  Filled 2024-04-21: qty 250

## 2024-04-21 MED ORDER — LIDOCAINE 2% (20 MG/ML) 5 ML SYRINGE
INTRAMUSCULAR | Status: DC | PRN
  Administered 2024-04-21: 1.5 mg/kg/h via INTRAVENOUS
  Administered 2024-04-21: 100 mg via INTRAVENOUS

## 2024-04-21 MED ORDER — ACETAMINOPHEN 500 MG PO TABS
1000.0000 mg | ORAL_TABLET | ORAL | Status: AC
  Administered 2024-04-21: 1000 mg via ORAL
  Filled 2024-04-21: qty 2

## 2024-04-21 MED ORDER — SCOPOLAMINE 1 MG/3DAYS TD PT72
1.0000 | MEDICATED_PATCH | TRANSDERMAL | Status: DC
  Administered 2024-04-21: 1 mg via TRANSDERMAL
  Filled 2024-04-21: qty 1

## 2024-04-21 MED ORDER — PROPOFOL 10 MG/ML IV BOLUS
INTRAVENOUS | Status: AC
  Filled 2024-04-21: qty 20

## 2024-04-21 MED ORDER — GABAPENTIN 100 MG PO CAPS
200.0000 mg | ORAL_CAPSULE | Freq: Two times a day (BID) | ORAL | Status: DC
  Administered 2024-04-21 – 2024-04-22 (×2): 200 mg via ORAL
  Filled 2024-04-21 (×2): qty 2

## 2024-04-21 MED ORDER — HYDRALAZINE HCL 20 MG/ML IJ SOLN: 10.0000 mg | INTRAMUSCULAR | Status: DC | PRN

## 2024-04-21 MED ORDER — ACETAMINOPHEN 500 MG PO TABS
1000.0000 mg | ORAL_TABLET | Freq: Three times a day (TID) | ORAL | Status: DC
  Administered 2024-04-21: 1000 mg via ORAL
  Filled 2024-04-21 (×2): qty 2

## 2024-04-21 MED ORDER — INSULIN ASPART 100 UNIT/ML IJ SOLN
INTRAMUSCULAR | Status: AC
Start: 2024-04-21 — End: 2024-04-21
  Filled 2024-04-21: qty 1

## 2024-04-21 MED ORDER — EPHEDRINE SULFATE-NACL 50-0.9 MG/10ML-% IV SOSY
PREFILLED_SYRINGE | INTRAVENOUS | Status: DC | PRN
  Administered 2024-04-21: 5 mg via INTRAVENOUS

## 2024-04-21 MED ORDER — SUCCINYLCHOLINE CHLORIDE 200 MG/10ML IV SOSY
PREFILLED_SYRINGE | INTRAVENOUS | Status: AC
  Filled 2024-04-21: qty 10

## 2024-04-21 MED ORDER — CEFOTETAN DISODIUM 2 G IJ SOLR
2.0000 g | INTRAMUSCULAR | Status: AC
  Administered 2024-04-21: 2 g via INTRAVENOUS
  Filled 2024-04-21: qty 2

## 2024-04-21 MED ORDER — ROCURONIUM BROMIDE 10 MG/ML (PF) SYRINGE
PREFILLED_SYRINGE | INTRAVENOUS | Status: DC | PRN
  Administered 2024-04-21 (×2): 10 mg via INTRAVENOUS
  Administered 2024-04-21: 60 mg via INTRAVENOUS
  Administered 2024-04-21: 20 mg via INTRAVENOUS

## 2024-04-21 MED ORDER — ACETAMINOPHEN 160 MG/5ML PO SOLN
1000.0000 mg | Freq: Three times a day (TID) | ORAL | Status: DC
  Administered 2024-04-21 – 2024-04-22 (×2): 1000 mg via ORAL
  Filled 2024-04-21 (×2): qty 40.6

## 2024-04-21 MED ORDER — OXYCODONE HCL 5 MG/5ML PO SOLN
5.0000 mg | Freq: Once | ORAL | Status: AC | PRN
  Administered 2024-04-21: 5 mg via ORAL

## 2024-04-21 MED ORDER — MORPHINE SULFATE (PF) 2 MG/ML IV SOLN: 1.0000 mg | INTRAVENOUS | Status: DC | PRN

## 2024-04-21 MED ORDER — PHENYLEPHRINE 80 MCG/ML (10ML) SYRINGE FOR IV PUSH (FOR BLOOD PRESSURE SUPPORT)
PREFILLED_SYRINGE | INTRAVENOUS | Status: DC | PRN
Start: 2024-04-21 — End: 2024-04-21
  Administered 2024-04-21 (×2): 80 ug via INTRAVENOUS

## 2024-04-21 MED ORDER — ONDANSETRON HCL 4 MG/2ML IJ SOLN: 4.0000 mg | Freq: Once | INTRAMUSCULAR | Status: DC | PRN

## 2024-04-21 MED ORDER — CHLORHEXIDINE GLUCONATE CLOTH 2 % EX PADS: 6.0000 | MEDICATED_PAD | Freq: Once | CUTANEOUS | Status: DC

## 2024-04-21 MED ORDER — SUGAMMADEX SODIUM 200 MG/2ML IV SOLN
INTRAVENOUS | Status: DC | PRN
  Administered 2024-04-21: 300 mg via INTRAVENOUS

## 2024-04-21 MED ORDER — HEPARIN SODIUM (PORCINE) 5000 UNIT/ML IJ SOLN
5000.0000 [IU] | INTRAMUSCULAR | Status: AC
  Administered 2024-04-21: 5000 [IU] via SUBCUTANEOUS
  Filled 2024-04-21: qty 1

## 2024-04-21 MED ORDER — STERILE WATER FOR IRRIGATION IR SOLN
Status: DC | PRN
  Administered 2024-04-21: 1000 mL

## 2024-04-21 MED ORDER — SIMETHICONE 80 MG PO CHEW
80.0000 mg | CHEWABLE_TABLET | Freq: Four times a day (QID) | ORAL | Status: DC | PRN
  Administered 2024-04-21: 80 mg via ORAL
  Filled 2024-04-21: qty 1

## 2024-04-21 MED ORDER — OXYCODONE HCL 5 MG/5ML PO SOLN
ORAL | Status: AC
  Filled 2024-04-21: qty 5

## 2024-04-21 MED ORDER — FENTANYL CITRATE PF 50 MCG/ML IJ SOSY: 25.0000 ug | PREFILLED_SYRINGE | INTRAMUSCULAR | Status: DC | PRN

## 2024-04-21 MED ORDER — INSULIN ASPART 100 UNIT/ML IJ SOLN: 0.0000 [IU] | Freq: Three times a day (TID) | INTRAMUSCULAR | Status: DC

## 2024-04-21 MED ORDER — MIDAZOLAM HCL 5 MG/5ML IJ SOLN
INTRAMUSCULAR | Status: DC | PRN
  Administered 2024-04-21: 2 mg via INTRAVENOUS

## 2024-04-21 MED ORDER — ONDANSETRON HCL 4 MG/2ML IJ SOLN
4.0000 mg | INTRAMUSCULAR | Status: DC | PRN
  Administered 2024-04-21 (×2): 4 mg via INTRAVENOUS
  Filled 2024-04-21 (×2): qty 2

## 2024-04-21 MED ORDER — OXYCODONE HCL 5 MG/5ML PO SOLN
5.0000 mg | Freq: Four times a day (QID) | ORAL | Status: DC | PRN
  Administered 2024-04-21 (×2): 5 mg via ORAL
  Filled 2024-04-21 (×2): qty 5

## 2024-04-21 MED ORDER — DEXTROSE-SODIUM CHLORIDE 5-0.45 % IV SOLN: INTRAVENOUS | Status: DC

## 2024-04-21 MED ORDER — CHLORHEXIDINE GLUCONATE 0.12 % MT SOLN
15.0000 mL | Freq: Once | OROMUCOSAL | Status: AC
  Administered 2024-04-21: 15 mL via OROMUCOSAL

## 2024-04-21 MED ORDER — ROCURONIUM BROMIDE 10 MG/ML (PF) SYRINGE
PREFILLED_SYRINGE | INTRAVENOUS | Status: AC
  Filled 2024-04-21: qty 10

## 2024-04-21 MED ORDER — BUPIVACAINE HCL 0.25 % IJ SOLN
INTRAMUSCULAR | Status: DC | PRN
  Administered 2024-04-21: 60 mL

## 2024-04-21 MED ORDER — DEXAMETHASONE SODIUM PHOSPHATE 10 MG/ML IJ SOLN
INTRAMUSCULAR | Status: DC | PRN
  Administered 2024-04-21: 5 mg via INTRAVENOUS

## 2024-04-21 MED ORDER — BUPIVACAINE HCL (PF) 0.25 % IJ SOLN
INTRAMUSCULAR | Status: AC
  Filled 2024-04-21: qty 60

## 2024-04-21 MED ORDER — ONDANSETRON HCL 4 MG/2ML IJ SOLN
INTRAMUSCULAR | Status: DC | PRN
  Administered 2024-04-21: 4 mg via INTRAVENOUS

## 2024-04-21 MED ORDER — PHENYLEPHRINE 80 MCG/ML (10ML) SYRINGE FOR IV PUSH (FOR BLOOD PRESSURE SUPPORT)
PREFILLED_SYRINGE | INTRAVENOUS | Status: AC
Start: 2024-04-21 — End: 2024-04-21
  Filled 2024-04-21: qty 10

## 2024-04-21 MED ORDER — LACTATED RINGERS IV SOLN: INTRAVENOUS | Status: DC

## 2024-04-21 MED ORDER — FAMOTIDINE IN NACL 20-0.9 MG/50ML-% IV SOLN
20.0000 mg | Freq: Two times a day (BID) | INTRAVENOUS | Status: DC
  Administered 2024-04-21 – 2024-04-22 (×2): 20 mg via INTRAVENOUS
  Filled 2024-04-21 (×2): qty 50

## 2024-04-21 MED ORDER — DEXAMETHASONE SODIUM PHOSPHATE 10 MG/ML IJ SOLN
INTRAMUSCULAR | Status: AC
  Filled 2024-04-21: qty 1

## 2024-04-21 MED ORDER — PROPOFOL 10 MG/ML IV BOLUS
INTRAVENOUS | Status: DC | PRN
  Administered 2024-04-21: 200 mg via INTRAVENOUS

## 2024-04-21 MED ORDER — DROPERIDOL 2.5 MG/ML IJ SOLN: 0.6250 mg | Freq: Once | INTRAMUSCULAR | Status: DC | PRN

## 2024-04-21 MED ORDER — INSULIN ASPART 100 UNIT/ML IJ SOLN
INTRAMUSCULAR | Status: AC
  Administered 2024-04-21: 2 [IU] via SUBCUTANEOUS
  Filled 2024-04-21: qty 1

## 2024-04-21 MED ORDER — CARBAMAZEPINE 200 MG PO TABS
200.0000 mg | ORAL_TABLET | Freq: Two times a day (BID) | ORAL | Status: DC
  Administered 2024-04-21 – 2024-04-22 (×2): 200 mg via ORAL
  Filled 2024-04-21 (×2): qty 1

## 2024-04-21 MED ORDER — OXYCODONE HCL 5 MG PO TABS: 5.0000 mg | ORAL_TABLET | Freq: Once | ORAL | Status: AC | PRN

## 2024-04-21 MED ORDER — DEXAMETHASONE SODIUM PHOSPHATE 10 MG/ML IJ SOLN: 4.0000 mg | INTRAMUSCULAR | Status: DC

## 2024-04-21 MED ORDER — FENTANYL CITRATE (PF) 250 MCG/5ML IJ SOLN
INTRAMUSCULAR | Status: AC
  Filled 2024-04-21: qty 5

## 2024-04-21 MED ORDER — BUPIVACAINE LIPOSOME 1.3 % IJ SUSP: 20.0000 mL | Freq: Once | INTRAMUSCULAR | Status: DC

## 2024-04-21 MED ORDER — MIDAZOLAM HCL 2 MG/2ML IJ SOLN
INTRAMUSCULAR | Status: AC
  Filled 2024-04-21: qty 2

## 2024-04-21 MED ORDER — KETAMINE HCL 50 MG/5ML IJ SOSY
PREFILLED_SYRINGE | INTRAMUSCULAR | Status: AC
  Filled 2024-04-21: qty 5

## 2024-04-21 MED ORDER — LIDOCAINE HCL (PF) 2 % IJ SOLN
INTRAMUSCULAR | Status: AC
Start: 2024-04-21 — End: 2024-04-21
  Filled 2024-04-21: qty 5

## 2024-04-21 MED ORDER — ONDANSETRON HCL 4 MG/2ML IJ SOLN
INTRAMUSCULAR | Status: AC
  Filled 2024-04-21: qty 2

## 2024-04-21 MED ORDER — PHENYLEPHRINE HCL-NACL 20-0.9 MG/250ML-% IV SOLN
INTRAVENOUS | Status: DC | PRN
  Administered 2024-04-21: 35 ug/min via INTRAVENOUS

## 2024-04-21 MED ORDER — SUCCINYLCHOLINE CHLORIDE 200 MG/10ML IV SOSY
PREFILLED_SYRINGE | INTRAVENOUS | Status: DC | PRN
  Administered 2024-04-21: 140 mg via INTRAVENOUS

## 2024-04-21 MED ORDER — FAMOTIDINE IN NACL 20-0.9 MG/50ML-% IV SOLN: 20.0000 mg | Freq: Two times a day (BID) | INTRAVENOUS | Status: DC

## 2024-04-21 MED ORDER — SUGAMMADEX SODIUM 200 MG/2ML IV SOLN
INTRAVENOUS | Status: AC
  Filled 2024-04-21: qty 4

## 2024-04-21 MED ORDER — ORAL CARE MOUTH RINSE: 15.0000 mL | Freq: Once | OROMUCOSAL | Status: AC

## 2024-04-21 MED ORDER — FENTANYL CITRATE (PF) 250 MCG/5ML IJ SOLN
INTRAMUSCULAR | Status: DC | PRN
  Administered 2024-04-21 (×2): 50 ug via INTRAVENOUS
  Administered 2024-04-21: 100 ug via INTRAVENOUS
  Administered 2024-04-21: 50 ug via INTRAVENOUS

## 2024-04-21 MED ORDER — ACETAMINOPHEN 10 MG/ML IV SOLN: 1000.0000 mg | Freq: Once | INTRAVENOUS | Status: DC | PRN

## 2024-04-21 MED ORDER — HEPARIN SODIUM (PORCINE) 5000 UNIT/ML IJ SOLN
5000.0000 [IU] | Freq: Three times a day (TID) | INTRAMUSCULAR | Status: DC
  Administered 2024-04-21 – 2024-04-22 (×2): 5000 [IU] via SUBCUTANEOUS
  Filled 2024-04-21 (×2): qty 1

## 2024-04-21 MED ORDER — LACTATED RINGERS IR SOLN
Status: DC | PRN
  Administered 2024-04-21: 1000 mL

## 2024-04-21 MED ORDER — ENSURE MAX PROTEIN PO LIQD
2.0000 [oz_av] | ORAL | Status: DC
  Administered 2024-04-22 (×4): 2 [oz_av] via ORAL

## 2024-04-21 MED ORDER — APREPITANT 40 MG PO CAPS
40.0000 mg | ORAL_CAPSULE | ORAL | Status: AC
Start: 2024-04-21 — End: 2024-04-21
  Administered 2024-04-21: 40 mg via ORAL
  Filled 2024-04-21: qty 1

## 2024-04-21 MED ORDER — KETAMINE HCL 10 MG/ML IJ SOLN
INTRAMUSCULAR | Status: DC | PRN
  Administered 2024-04-21: 30 mg via INTRAVENOUS
  Administered 2024-04-21: 20 mg via INTRAVENOUS

## 2024-04-21 SURGICAL SUPPLY — 45 items
BAG COUNTER SPONGE SURGICOUNT (BAG) IMPLANT
BAG LAPAROSCOPIC 12 15 PORT 16 (BASKET) ×1 IMPLANT
BENZOIN TINCTURE PRP APPL 2/3 (GAUZE/BANDAGES/DRESSINGS) ×1 IMPLANT
BLADE SURG SZ11 CARB STEEL (BLADE) ×1 IMPLANT
BNDG ADH 1X3 SHEER STRL LF (GAUZE/BANDAGES/DRESSINGS) ×6 IMPLANT
CABLE HIGH FREQUENCY MONO STRZ (ELECTRODE) IMPLANT
CHLORAPREP W/TINT 26 (MISCELLANEOUS) ×1 IMPLANT
CLIP APPLIE ROT 13.4 12 LRG (CLIP) IMPLANT
COVER SURGICAL LIGHT HANDLE (MISCELLANEOUS) ×1 IMPLANT
DRAPE UTILITY XL STRL (DRAPES) ×2 IMPLANT
ELECT REM PT RETURN 15FT ADLT (MISCELLANEOUS) ×1 IMPLANT
GAUZE 4X4 16PLY ~~LOC~~+RFID DBL (SPONGE) ×1 IMPLANT
GLOVE BIOGEL PI IND STRL 7.0 (GLOVE) ×1 IMPLANT
GLOVE SURG SS PI 7.0 STRL IVOR (GLOVE) ×1 IMPLANT
GOWN STRL REUS W/ TWL LRG LVL3 (GOWN DISPOSABLE) ×1 IMPLANT
GRASPER SUT TROCAR 14GX15 (MISCELLANEOUS) ×1 IMPLANT
IRRIGATION SUCT STRKRFLW 2 WTP (MISCELLANEOUS) ×1 IMPLANT
KIT BASIN OR (CUSTOM PROCEDURE TRAY) ×1 IMPLANT
KIT TURNOVER KIT A (KITS) ×1 IMPLANT
MARKER SKIN DUAL TIP RULER LAB (MISCELLANEOUS) IMPLANT
MAT PREVALON FULL STRYKER (MISCELLANEOUS) IMPLANT
NDL SPNL 22GX3.5 QUINCKE BK (NEEDLE) ×1 IMPLANT
NEEDLE SPNL 22GX3.5 QUINCKE BK (NEEDLE) ×1 IMPLANT
PACK UNIVERSAL I (CUSTOM PROCEDURE TRAY) ×1 IMPLANT
RELOAD STAPLE 60 3.6 BLU REG (STAPLE) IMPLANT
RELOAD STAPLE 60 3.8 GOLD REG (STAPLE) IMPLANT
RELOAD STAPLE 60 4.1 GRN THCK (STAPLE) IMPLANT
SCISSORS LAP 5X45 EPIX DISP (ENDOMECHANICALS) IMPLANT
SET TUBE SMOKE EVAC HIGH FLOW (TUBING) ×1 IMPLANT
SHEARS HARMONIC 45 ACE (MISCELLANEOUS) ×1 IMPLANT
SLEEVE GASTRECTOMY 40FR VISIGI (MISCELLANEOUS) ×1 IMPLANT
SLEEVE Z-THREAD 5X100MM (TROCAR) ×3 IMPLANT
SOLUTION ANTFG W/FOAM PAD STRL (MISCELLANEOUS) ×1 IMPLANT
SPIKE FLUID TRANSFER (MISCELLANEOUS) ×1 IMPLANT
STAPLER ECHELON LONG 3000 60 (ENDOMECHANICALS) ×1 IMPLANT
STRIP CLOSURE SKIN 1/2X4 (GAUZE/BANDAGES/DRESSINGS) ×1 IMPLANT
SUT ETHIBOND 0 36 GRN (SUTURE) IMPLANT
SUT MNCRL AB 4-0 PS2 18 (SUTURE) ×1 IMPLANT
SUT VICRYL 0 TIES 12 18 (SUTURE) ×1 IMPLANT
SYR 20ML LL LF (SYRINGE) ×1 IMPLANT
SYR 50ML LL SCALE MARK (SYRINGE) ×1 IMPLANT
SYSTEM KII OPTICAL ACCESS 15MM (TROCAR) ×1 IMPLANT
TOWEL OR 17X26 10 PK STRL BLUE (TOWEL DISPOSABLE) ×1 IMPLANT
TROCAR Z-THREAD OPTICAL 5X100M (TROCAR) ×1 IMPLANT
TUBING CONNECTING 10 (TUBING) ×2 IMPLANT

## 2024-04-21 NOTE — Telephone Encounter (Signed)
 Patient Product/process development scientist completed.    The patient is insured through [facility]. Patient has [employer], may use a copay card, and/or apply for patient assistance if available.    Ran test claim for enoxaparin (Lovenox) 40 mg/0.4 ml injection and the current 14 day co-pay is $9.00.   This test claim was processed through [facility]- copay amounts may vary at other pharmacies due to pharmacy/plan contracts, or as the patient moves through the different stages of their insurance plan.     Reyes Sharps, CPHT Pharmacy Technician III Certified Patient Advocate [facility] Patient Advocate Team Direct Number: [phone]  Fax: [phone]

## 2024-04-21 NOTE — Progress Notes (Signed)
 Discussed QI Goals for Discharge document with patient including ambulation in halls, Incentive Spirometry use every hour, and oral care.  Also discussed pain and nausea control.  BSTOP education provided including BSTOP information guide, Guide for Pain Management after your Bariatric Procedure.  Diet progression education provided including Bariatric Surgery Post-Op Food Plan Phase 1: Liquids.  Questions answered.  Will continue to partner with bedside RN and follow up with patient per protocol after arrival to the floor.

## 2024-04-21 NOTE — Anesthesia Procedure Notes (Signed)
 Procedure Name: Intubation Date/Time: 04/21/2024 10:20 AM  Performed by: Zulema Leita PARAS, CRNAPre-anesthesia Checklist: Patient identified, Emergency Drugs available, Suction available and Patient being monitored Patient Re-evaluated:Patient Re-evaluated prior to induction Oxygen Delivery Method: Circle system utilized Preoxygenation: Pre-oxygenation with 100% oxygen Induction Type: IV induction Ventilation: Mask ventilation without difficulty Laryngoscope Size: Mac and 4 Grade View: Grade II Tube type: Oral Tube size: 7.5 mm Number of attempts: 1 Airway Equipment and Method: Stylet Placement Confirmation: ETT inserted through vocal cords under direct vision, positive ETCO2 and breath sounds checked- equal and bilateral Secured at: 22 cm Tube secured with: Tape Dental Injury: Teeth and Oropharynx as per pre-operative assessment

## 2024-04-21 NOTE — H&P (Signed)
 Chief Complaint  Patient presents with  Return Weight Loss  Preop for surgery date 04/21/2024; LSG and upper endo, A1C needs repeat   Subjective   Gregory Fitzgerald is a 58 y.o. male established patient in today for: History of Present Illness Gregory Fitzgerald is a 58 year old male with diabetes who presents for preoperative evaluation for bariatric surgery.  He is scheduled for a sleeve gastrectomy. His diabetes management changed from Mounjaro  to Dilantus insulin , resulting in improved blood sugar levels with recent readings of 97 and 160. He has undergone knee surgeries and tolerates pain well. He is concerned about postoperative pain and dietary changes but is prepared for the necessary adjustments after surgery.  There is no problem list on file for this patient.  Outpatient Medications Prior to Visit  Medication Sig Dispense Refill  ezetimibe  (ZETIA ) 10 mg tablet Take 1 tablet by mouth once daily  fluvastatin  XL (LESCOL  XL) 80 mg XL tablet Take 80 mg by mouth once daily  lisinopriL  (ZESTRIL ) 40 MG tablet Take 40 mg by mouth once daily  tadalafiL  (CIALIS ) 20 MG tablet tadalafil  20 mg tablet  XIGDUO  XR 5-1,000 mg XR 24 hr bipahsic tablet Take 2 tablets by mouth once daily  tirzepatide  (MOUNJARO ) 12.5 mg/0.5 mL pen injector Inject 12.5 mg subcutaneously once a week   No facility-administered medications prior to visit.    Objective   Vitals:  04/09/24 0844  BP: 119/82  Pulse: 106  Temp: 36.3 C (97.3 F)  TempSrc: Temporal  SpO2: 97%  Weight: (!) 156.3 kg (344 lb 9.6 oz)  Height: 182.9 cm (6')  PainSc: 0-No pain   Body mass index is 46.74 kg/m. Physical Exam Constitutional:  Appearance: Normal appearance.  HENT:  Head: Normocephalic and atraumatic.  Pulmonary:  Effort: Pulmonary effort is normal.  Musculoskeletal:  General: Normal range of motion.  Cervical back: Normal range of motion.  Neurological:  General: No focal deficit present.  Mental Status: He is alert and  oriented to person, place, and time. Mental status is at baseline.  Psychiatric:  Mood and Affect: Mood normal.  Behavior: Behavior normal.  Thought Content: Thought content normal.      I reviewed psych notes showing he is a good candidate for surgery. I reviewed [place] Thapa's notes showing a spike in A1c in July and adding lantus . I reviewed UGI results showing normal pattern and no HH.  Assessment/Plan:   Assessment & Plan Morbid obesity due to excess caloric intake Planned laparoscopic sleeve gastrectomy to remove 80% of stomach volume. Discussed risks: bleeding (1%), surgical connection breakdown (1 in 500), rare blood clots. Explained postoperative course, dietary changes, potential reflux, lifelong multivitamin and calcium  supplementation, and changes in taste and appetite. - Perform laparoscopic sleeve gastrectomy on September 30th. - Monitor postoperative pain and nausea, treat as needed. - Initiate liquid diet post-surgery, progress to high-protein diet after two weeks. - Administer blood thinners perioperatively, continue for two weeks post-discharge. - Monitor for complications: bleeding, surgical connection breakdown, blood clots. - Educate on dietary changes and potential vitamin deficiencies. - Prescribe lifelong multivitamin and calcium  supplements. - Check labs at two months post-surgery and annually. Diagnoses and all orders for this visit:  Morbid (severe) obesity due to excess calories (CMS/HHS-HCC)  Type 2 diabetes mellitus without complication, without long-term current use of insulin  (CMS/HHS-HCC)  OSA (obstructive sleep apnea)

## 2024-04-21 NOTE — Anesthesia Postprocedure Evaluation (Signed)
 Anesthesia Post Note  Patient: Gregory Fitzgerald  Procedure(s) Performed: GASTRECTOMY, SLEEVE, LAPAROSCOPIC ENDOSCOPY, UPPER GI TRACT     Patient location during evaluation: PACU Anesthesia Type: General Level of consciousness: awake Pain management: pain level controlled Vital Signs Assessment: post-procedure vital signs reviewed and stable Respiratory status: spontaneous breathing Cardiovascular status: blood pressure returned to baseline Postop Assessment: no apparent nausea or vomiting Anesthetic complications: no   No notable events documented.  Last Vitals:  Vitals:   04/21/24 1306 04/21/24 1407  BP: (!) 170/95 (!) 159/91  Pulse: (!) 56 (!) 57  Resp: 16 18  Temp: 36.6 C 36.7 C  SpO2: 91% 94%    Last Pain:  Vitals:   04/21/24 1433  TempSrc:   PainSc: 6                  Lauraine KATHEE [place]

## 2024-04-21 NOTE — Progress Notes (Signed)
 PHARMACY CONSULT FOR:  Risk Assessment for Post-Discharge VTE Following Bariatric Surgery  Procedure* laparoscopic Sleeve Gastrectomy   Sex M  Black race Y  Age (years) 81  BMI (kg/m2) 46  Operation duration (minutes) 61  History of VTE requiring treatment* no  Hypercoagulable condition* no  Liver disorder* no  Pre-op venous stasis no  Pre-op functional health status Independent    Previous foregut or bariatric surg no  Post-op surgical site infection no  Transfusion intra- or post-op* no  Unplanned readmission no  Unplanned reoperation no  GI perforation/leak/obstruction* no  *specific risk factors for portomesenteric venous thrombosis   Predicted probability of 30-day post-discharge VTE:  0.5545 % estimated using the [facility] Calculator  Other patient-specific factors to consider:   Recommendation for Discharge: Enoxaparin 40 mg Hamblen q12h x 2 weeks post-discharge      Gregory Fitzgerald is a 59 y.o. male who underwent  laparoscopic Sleeve Gastrectomy   on 04/21/24   Case start: 1043  Case end: 1143   No Known Allergies  Patient Measurements: Height: 6' (182.9 cm) Weight: (!) 154.7 kg (341 lb) IBW/kg (Calculated) : 77.6 Body mass index is 46.25 kg/m.  Recent Labs    04/21/24 1236  WBC 9.5  HGB 13.0  HCT 39.2  PLT 244   Estimated Creatinine Clearance: 108.1 mL/min (by C-G formula based on SCr of 1.17 mg/dL).    Past Medical History:  Diagnosis Date   Arthritis    knees   Diabetes (HCC)    GERD (gastroesophageal reflux disease)    years ago, not currently    Hyperlipidemia    Hypertension    Trigeminal neuralgia      Medications Prior to Admission  Medication Sig Dispense Refill Last Dose/Taking   aspirin 81 MG tablet Take 81 mg by mouth daily.   Past Week   blood glucose meter kit and supplies KIT Dispense based on patient and insurance preference. Use up to two times daily as directed. 1 each 0 Past Week   Blood  Glucose Monitoring Suppl (ACCU-CHEK AVIVA PLUS) w/Device KIT Use to check blood sugar twice a daily. 1 kit 0 Past Week   carbamazepine  (TEGRETOL ) 200 MG tablet Take 1 tablet (200 mg total) by mouth 2 (two) times daily. 180 tablet 1 04/21/2024 Morning   clotrimazole -betamethasone  (LOTRISONE ) cream APPLY TO AFFECTED AREA TWICE A DAY (Patient taking differently: Apply 1 Application topically 2 (two) times daily.) 45 g 0 Taking Differently   Dapagliflozin  Pro-metFORMIN  ER (XIGDUO  XR) 11-998 MG TB24 Take 2 tablets by mouth daily. 180 tablet 3 04/18/2024   ezetimibe  (ZETIA ) 10 MG tablet Take 1 tablet (10 mg total) by mouth daily. 90 tablet 3 04/21/2024 Morning   fluvastatin  XL (LESCOL  XL) 80 MG 24 hr tablet Take 1 tablet (80 mg total) by mouth daily. 90 tablet 3 04/20/2024 Morning   glimepiride  (AMARYL ) 2 MG tablet Take 1 tablet (2 mg total) by mouth daily before breakfast. 90 tablet 3 04/20/2024 Noon   Glucose Blood (BLOOD GLUCOSE TEST STRIPS) STRP 1 each by In Vitro route daily. May substitute to any manufacturer covered by patient's insurance. 100 each 3 Past Week   glucose blood (FREESTYLE LITE) test strip USE AS INSTRUCTED TWICE A DAY 100 strip 3 Past Week   insulin  glargine (LANTUS  SOLOSTAR) 100 UNIT/ML Solostar Pen Inject 30 Units into the skin daily. 15 mL 3 04/21/2024 Morning   Insulin  Pen Needle (NOVOFINE) 32G X 6 MM MISC Use  1 per day to inject insulin  50 each 5 04/20/2024   Insulin  Pen Needle 32G X 4 MM MISC Use 4x a day 300 each 3 04/20/2024   Lancets Misc. MISC 1 each by Does not apply route daily. May substitute to any manufacturer covered by patient's insurance. 100 each 3 04/20/2024   lisinopril  (ZESTRIL ) 40 MG tablet Take 1 tablet (40 mg total) by mouth daily. 90 tablet 3 04/20/2024 Morning   meloxicam (MOBIC) 15 MG tablet Take 15 mg by mouth daily as needed for pain.   Past Week   tadalafil  (CIALIS ) 20 MG tablet TAKE 1 TABLET EVERY DAY AS NEEDED FOR ERECTILE DYSFUNCTION (Patient taking differently:  Take 20 mg by mouth daily as needed for erectile dysfunction. TAKE 1 TABLET EVERY DAY AS NEEDED FOR ERECTILE DYSFUNCTION) 10 tablet 2 Unknown     Quinlan Vollmer, PharmD, BCPS 04/21/2024 2:02 PM

## 2024-04-21 NOTE — Op Note (Signed)
 Preop Diagnosis: Obesity Class III  Postop Diagnosis: same  Procedure performed: laparoscopic Sleeve Gastrectomy, upper endoscopy  Assitant: Puja Maczis, PAC  Indications:  The patient is a 58 y.o. year-old morbidly obese male who has been followed in the Bariatric Clinic as an outpatient. This patient was diagnosed with morbid obesity with a BMI of Body mass index is 46.25 kg/m. and significant co-morbidities including hypertension.  The patient was counseled extensively in the Bariatric Outpatient Clinic and after a thorough explanation of the risks and benefits of surgery (including death from complications, bowel leak, infection such as peritonitis and/or sepsis, internal hernia, bleeding, need for blood transfusion, bowel obstruction, organ failure, pulmonary embolus, deep venous thrombosis, wound infection, incisional hernia, skin breakdown, and others entailed on the consent form) and after a compliant diet and exercise program, the patient was scheduled for an elective laparoscopic sleeve gastrectomy.  Description of Operation:  Following informed consent, the patient was taken to the operating room and placed on the operating table in the supine position.  He had previously received prophylactic antibiotics and subcutaneous heparin for DVT prophylaxis in the pre-op holding area.  After induction of general endotracheal anesthesia by the anesthesiologist, the patient underwent placement of sequential compression devices and an oro-gastric tube.  A timeout was confirmed by the surgery and anesthesia teams.  The patient was adequately padded at all pressure points and placed on a footboard to prevent slippage from the OR table during extremes of position during surgery.  He underwent a routine sterile prep and drape of her entire abdomen.    Next, A transverse incision was made under the left subcostal area and a 5mm optical viewing trocar was introduced into the peritoneal cavity.  Pneumoperitoneum was applied with a high flow and low pressure. A laparoscope was inserted to confirm placement. A extraperitoneal block was then placed at the lateral abdominal wall using exparel diluted with marcaine. 5 additional incisions were placed: 1 5mm trocar to the left of the midline. 1 additional 5mm trocar in the left lateral area, 1 12mm trocar in the right mid abdomen, 1 5mm trocar in the right subcostal area, and a Nathanson retractor was placed through a subxiphoid incision.  Next, a hole was created through the lesser omentum along the greater curve of the stomach to enter the lesser sac. The vessels along the greater omentum were  Then ligated and divided using the Harmonic scalpel moving towards the spleen and then short gastric vessels were ligated and divided in the same fashion to fully mobilize the fundus. The left crus was identified to ensure completion of the dissection. Next the antrum was measured and dissection continued inferiorly along the greater curve towards the pylorus and stopped 6cm from the pylorus.   A 40Fr ViSiGi dilator was placed into the esophgaus and along the lesser curve of the stomach and placed on suction.  2 60mm Gold load echelon stapler(s) followed by 4 60mm blue load echelon stapler(s) were used to make the resection along the antrum being sure to stay well away from the angularis by angling the jaws of the stapler towards the greater curve and later completing the resection staying along the ViSiGi and ensuring the fundus was not retained by appropriately retracting it lateral. Air was inserted through the ViSiGi to perform a leak test showing no bubbles and a neutral lie of the stomach.  No bubbles were seen and the sleeve and antrum distended appropriately. The specimen was then placed in an endocatch bag  and removed by the 15mm port. Hemostasis was ensured.   The endoscopy was placed in the mouth and into the oropharynx and under endoscopic vision it  was advanced to the esophagogastric junction.  The stomach was insufflated and no bleeding or bubbles were seen.  The GEJ was identified at 42cm from the teeth. No bleeding or leaks were detected. The scope was withdrawn without difficulty.    The fascia of the 15mm port was closed with a 0 vicryl by suture passer. Pneumoperitoneum was evacuated, all ports were removed and all incisions closed with 4-0 monocryl suture in subcuticular fashion. Steristrips and bandaids were put in place for dressing. The patient awoke from anesthesia and was brought to pacu in stable condition. All counts were correct.  Estimated blood loss: <38ml  Specimens:  Sleeve gastrectomy  Local Anesthesia: 60 ml Marcaine Post-Op Plan:       Pain Management: PO, prn      Antibiotics: Prophylactic      Anticoagulation: Prophylactic, Starting now      Post Op Studies/Consults: Not applicable      Intended Discharge: within 48h      Intended Outpatient Follow-Up: Two Week      Intended Outpatient Studies: Not Applicable      Other: Not Applicable   Gregory Fitzgerald Gregory Fitzgerald

## 2024-04-21 NOTE — Progress Notes (Signed)
   04/21/24 2000  BiPAP/CPAP/SIPAP  Reason BIPAP/CPAP not in use Non-compliant (Pt does not wish to use one.)  MEWS Score/Color  MEWS Score 0  MEWS Score Color Gregory Fitzgerald

## 2024-04-21 NOTE — Anesthesia Preprocedure Evaluation (Addendum)
 Anesthesia Evaluation  Patient identified by MRN, date of birth, ID band Patient awake    Reviewed: Allergy & Precautions, NPO status , Patient's Chart, lab work & pertinent test results  History of Anesthesia Complications Negative for: history of anesthetic complications  Airway Mallampati: I  TM Distance: >3 FB Neck ROM: Full    Dental no notable dental hx. (+) Dental Advisory Given   Pulmonary neg recent URI   breath sounds clear to auscultation       Cardiovascular hypertension, (-) angina (-) CAD and (-) Past MI  Rhythm:Regular Rate:Normal     Neuro/Psych History of Trigeminal Neuralgia    GI/Hepatic ,GERD  ,,  Endo/Other  diabetes    Renal/GU      Musculoskeletal   Abdominal   Peds  Hematology   Anesthesia Other Findings   Reproductive/Obstetrics                              Anesthesia Physical Anesthesia Plan  ASA: 3  Anesthesia Plan: General   Post-op Pain Management:    Induction: Intravenous and Rapid sequence  PONV Risk Score and Plan: 1 and Ondansetron, Aprepitant, Dexamethasone and Scopolamine patch - Pre-op  Airway Management Planned: Simple Face Mask  Additional Equipment:   Intra-op Plan:   Post-operative Plan: Extubation in OR  Informed Consent:      Dental advisory given  Plan Discussed with: CRNA and Surgeon  Anesthesia Plan Comments:          Anesthesia Quick Evaluation

## 2024-04-21 NOTE — Transfer of Care (Signed)
 Immediate Anesthesia Transfer of Care Note  Patient: Gregory Fitzgerald  Procedure(s) Performed: GASTRECTOMY, SLEEVE, LAPAROSCOPIC ENDOSCOPY, UPPER GI TRACT  Patient Location: PACU  Anesthesia Type:General  Level of Consciousness: awake, alert , and oriented  Airway & Oxygen Therapy: Patient Spontanous Breathing and Patient connected to face mask oxygen  Post-op Assessment: Report given to RN and Post -op Vital signs reviewed and stable  Post vital signs: Reviewed and stable  Last Vitals:  Vitals Value Taken Time  BP 150/84 04/21/24 12:00  Temp    Pulse 72 04/21/24 12:00  Resp 21 04/21/24 12:00  SpO2 100 % 04/21/24 12:00  Vitals shown include unfiled device data.  Last Pain:  Vitals:   04/21/24 0825  TempSrc:   PainSc: 0-No pain         Complications: No notable events documented.

## 2024-04-22 ENCOUNTER — Encounter (HOSPITAL_COMMUNITY): Payer: Self-pay | Admitting: General Surgery

## 2024-04-22 ENCOUNTER — Other Ambulatory Visit (HOSPITAL_COMMUNITY): Payer: Self-pay

## 2024-04-22 ENCOUNTER — Other Ambulatory Visit: Payer: Self-pay

## 2024-04-22 LAB — CBC WITH DIFFERENTIAL/PLATELET
Abs Immature Granulocytes: 0.03 K/uL (ref 0.00–0.07)
Basophils Absolute: 0 K/uL (ref 0.0–0.1)
Basophils Relative: 0 %
Eosinophils Absolute: 0 K/uL (ref 0.0–0.5)
Eosinophils Relative: 0 %
HCT: 43 % (ref 39.0–52.0)
Hemoglobin: 13.9 g/dL (ref 13.0–17.0)
Immature Granulocytes: 0 %
Lymphocytes Relative: 16 %
Lymphs Abs: 1.5 K/uL (ref 0.7–4.0)
MCH: 32.1 pg (ref 26.0–34.0)
MCHC: 32.3 g/dL (ref 30.0–36.0)
MCV: 99.3 fL (ref 80.0–100.0)
Monocytes Absolute: 0.9 K/uL (ref 0.1–1.0)
Monocytes Relative: 10 %
Neutro Abs: 6.8 K/uL (ref 1.7–7.7)
Neutrophils Relative %: 74 %
Platelets: 283 K/uL (ref 150–400)
RBC: 4.33 MIL/uL (ref 4.22–5.81)
RDW: 12.9 % (ref 11.5–15.5)
WBC: 9.3 K/uL (ref 4.0–10.5)
nRBC: 0 % (ref 0.0–0.2)

## 2024-04-22 MED ORDER — ONDANSETRON 4 MG PO TBDP
4.0000 mg | ORAL_TABLET | Freq: Four times a day (QID) | ORAL | 0 refills | Status: DC | PRN
  Filled 2024-04-22 (×2): qty 20, 5d supply, fill #0

## 2024-04-22 MED ORDER — ENOXAPARIN SODIUM 40 MG/0.4ML IJ SOSY
40.0000 mg | PREFILLED_SYRINGE | Freq: Two times a day (BID) | INTRAMUSCULAR | 0 refills | Status: DC
  Filled 2024-04-22: qty 11.2, 14d supply, fill #0

## 2024-04-22 MED ORDER — ENOXAPARIN SODIUM 40 MG/0.4ML IJ SOSY: 40.0000 mg | PREFILLED_SYRINGE | Freq: Two times a day (BID) | INTRAMUSCULAR | Status: DC

## 2024-04-22 MED ORDER — ENOXAPARIN (LOVENOX) PATIENT EDUCATION KIT
PACK | Freq: Once | Status: AC
  Filled 2024-04-22: qty 1

## 2024-04-22 MED ORDER — PANTOPRAZOLE SODIUM 40 MG PO TBEC
40.0000 mg | DELAYED_RELEASE_TABLET | Freq: Every day | ORAL | 0 refills | Status: DC
  Filled 2024-04-22: qty 30, 30d supply, fill #0

## 2024-04-22 MED ORDER — GABAPENTIN 100 MG PO CAPS
100.0000 mg | ORAL_CAPSULE | Freq: Two times a day (BID) | ORAL | 0 refills | Status: DC
  Filled 2024-04-22: qty 10, 5d supply, fill #0

## 2024-04-22 MED ORDER — HEPARIN SODIUM (PORCINE) 5000 UNIT/ML IJ SOLN: 5000.0000 [IU] | Freq: Three times a day (TID) | INTRAMUSCULAR | Status: DC

## 2024-04-22 NOTE — Progress Notes (Signed)
 Lovenox teaching kit and education provided to patient.  Patient able to provide verbal teach back with this nurse including process for how to give medication, when to give medication, and when to call [facility] for concerns related to medication.   Patient states he has given himself injections in the past and feels comfortable with medication.

## 2024-04-22 NOTE — Progress Notes (Unsigned)
 PATIENT: Gregory Fitzgerald DOB: 09-Sep-1965  REASON FOR VISIT: follow up HISTORY FROM: patient  No chief complaint on file.    HISTORY OF PRESENT ILLNESS:  04/22/24 ALL:  Gregory Fitzgerald is a 58 y.o. male here today for follow up for OSA on CPAP.  He was seen in consult with Dr Buck 11/2023 for concerns of snoring and disrupted sleep. HST showed moderate obstructive sleep apnea with a total AHI of 24.5/hour and O2 nadir of 78.3%. AutoPAP advised. Since,     HISTORY: (copied from Dr Obie previous note)  Dear Dr. Stevie,    I saw your patient, Gregory Fitzgerald, upon your kind request in my sleep clinic today for initial consultation of his sleep disorder, in particular, concern for underlying obstructive sleep apnea.  The patient is unaccompanied today.  As you know, Gregory Fitzgerald is a 58 year old male with an underlying medical history of diabetes, arthritis, reflux disease, hypertension, hyperlipidemia, facial pain/trigeminal neuralgia (for which he saw my colleague Dr. 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  He works third shift.  His Epworth sleepiness score is 5 out of 24, fatigue severity score is 13 out of 63.  I reviewed the office note from 11/29/2023.  He is being considered for bariatric surgery, particularly sleeve gastrectomy.  He works from 8 PM to 8 AM, Sunday through Thursday.  He works as a Occupational hygienist for [employer], he Surveyor, quantity.  He lives with his wife and 91 year old son, he also has 2 older daughters.  They have 2 dogs in the household and the dogs typically sleep in dog beds.  He has a TV in the bedroom and it tends to be on when he goes to bed but he tries to turn it off.  Bedtime is generally around 9 AM and rise time around 3 PM.  He has 1 bathroom break once he goes to sleep.  Denies recurrent headaches.  He does not  aware of any family history of sleep apnea.  He believes he had a tonsillectomy as a child.  He does not drink caffeine daily, maybe 3 days a week.  He typically drinks a soda.  He does not drink any coffee.  He is a non-smoker.  He drinks alcohol rarely.   REVIEW OF SYSTEMS: Out of a complete 14 system review of symptoms, the patient complains only of the following symptoms, and all other reviewed systems are negative.  ESS:  ALLERGIES: No Known Allergies  HOME MEDICATIONS: Outpatient Medications Prior to Visit  Medication Sig Dispense Refill   aspirin 81 MG tablet Take 81 mg by mouth daily.     blood glucose meter kit and supplies KIT Dispense based on patient and insurance preference. Use up to two times daily as directed. 1 each 0   Blood Glucose Monitoring Suppl (ACCU-CHEK AVIVA PLUS) w/Device KIT Use to check blood sugar twice a daily. 1 kit 0   carbamazepine  (TEGRETOL ) 200 MG tablet Take 1 tablet (200 mg total) by mouth 2 (two) times daily. 180 tablet 1   clotrimazole -betamethasone  (LOTRISONE ) cream APPLY TO AFFECTED AREA TWICE A DAY (Patient taking differently: Apply 1 Application topically 2 (two) times daily.) 45 g 0   Dapagliflozin  Pro-metFORMIN  ER (XIGDUO  XR) 11-998 MG TB24 Take 2 tablets by mouth daily. 180 tablet 3   enoxaparin (LOVENOX)  40 MG/0.4ML injection Inject 0.4 mLs (40 mg total) into the skin every 12 (twelve) hours for 14 days. 11.2 mL 0   ezetimibe  (ZETIA ) 10 MG tablet Take 1 tablet (10 mg total) by mouth daily. 90 tablet 3   fluvastatin  XL (LESCOL  XL) 80 MG 24 hr tablet Take 1 tablet (80 mg total) by mouth daily. 90 tablet 3   gabapentin (NEURONTIN) 100 MG capsule Take 1 capsule (100 mg total) by mouth every 12 (twelve) hours for 5 days. 10 capsule 0   Glucose Blood (BLOOD GLUCOSE TEST STRIPS) STRP 1 each by In Vitro route daily. May substitute to any manufacturer covered by patient's insurance. 100 each 3   glucose blood (FREESTYLE LITE) test strip USE AS INSTRUCTED  TWICE A DAY 100 strip 3   lisinopril  (ZESTRIL ) 40 MG tablet Take 1 tablet (40 mg total) by mouth daily. 90 tablet 3   ondansetron (ZOFRAN-ODT) 4 MG disintegrating tablet Dissolve 1 tablet (4 mg total) by mouth every 6 (six) hours as needed for nausea or vomiting. 20 tablet 0   pantoprazole (PROTONIX) 40 MG tablet Take 1 tablet (40 mg total) by mouth daily. 90 tablet 0   tadalafil  (CIALIS ) 20 MG tablet TAKE 1 TABLET EVERY DAY AS NEEDED FOR ERECTILE DYSFUNCTION (Patient taking differently: Take 20 mg by mouth daily as needed for erectile dysfunction. TAKE 1 TABLET EVERY DAY AS NEEDED FOR ERECTILE DYSFUNCTION) 10 tablet 2   Facility-Administered Medications Prior to Visit  Medication Dose Route Frequency Provider Last Rate Last Admin   acetaminophen (TYLENOL) tablet 1,000 mg  1,000 mg Oral Q8H Kinsinger, Herlene Righter, MD   1,000 mg at 04/21/24 2309   Or   acetaminophen (TYLENOL) 160 MG/5ML solution 1,000 mg  1,000 mg Oral Q8H Kinsinger, Herlene Righter, MD   1,000 mg at 04/22/24 0913   carbamazepine  (TEGRETOL ) tablet 200 mg  200 mg Oral BID Kinsinger, Herlene Righter, MD   200 mg at 04/22/24 0911   dextrose 5 % and 0.45 % NaCl infusion   Intravenous Continuous Kinsinger, Herlene Righter, MD   Stopping previously hung infusion at 04/22/24 1109   famotidine (PEPCID) IVPB 20 mg premix  20 mg Intravenous Q12H Kinsinger, Herlene Righter, MD   Stopped at 04/22/24 [phone]   gabapentin (NEURONTIN) capsule 200 mg  200 mg Oral Q12H Kinsinger, Herlene Righter, MD   200 mg at 04/22/24 9091   hydrALAZINE (APRESOLINE) injection 10 mg  10 mg Intravenous Q2H PRN Kinsinger, Herlene Righter, MD       morphine (PF) 2 MG/ML injection 1-3 mg  1-3 mg Intravenous Q1H PRN Kinsinger, Herlene Righter, MD       ondansetron (ZOFRAN) injection 4 mg  4 mg Intravenous Q4H PRN Kinsinger, Herlene Righter, MD   4 mg at 04/21/24 2130   oxyCODONE (ROXICODONE) 5 MG/5ML solution 5 mg  5 mg Oral Q6H PRN Kinsinger, Herlene Righter, MD   5 mg at 04/21/24 2130   protein supplement (ENSURE  MAX) liquid  2 oz Oral Q2H Kinsinger, Herlene Righter, MD   2 oz at 04/22/24 1049   simethicone (MYLICON) chewable tablet 80 mg  80 mg Oral Q6H PRN Kinsinger, Herlene Righter, MD   80 mg at 04/21/24 2131    PAST MEDICAL HISTORY: Past Medical History:  Diagnosis Date   Arthritis    knees   Diabetes (HCC)    GERD (gastroesophageal reflux disease)    years ago, not currently    Hyperlipidemia    Hypertension  Trigeminal neuralgia     PAST SURGICAL HISTORY: Past Surgical History:  Procedure Laterality Date   club foot surgery     age 77 months   COLONOSCOPY     KNEE SURGERY     x 2 in high school    LAPAROSCOPIC GASTRIC SLEEVE RESECTION N/A 04/21/2024   Procedure: GASTRECTOMY, SLEEVE, LAPAROSCOPIC;  Surgeon: Kinsinger, Herlene Righter, MD;  Location: [facility];  Service: General;  Laterality: N/A;   UPPER GI ENDOSCOPY N/A 04/21/2024   Procedure: ENDOSCOPY, UPPER GI TRACT;  Surgeon: Kinsinger, Herlene Righter, MD;  Location: [facility];  Service: General;  Laterality: N/A;    FAMILY HISTORY: Family History  Problem Relation Age of Onset   Diabetes Mother    Diabetes Father    Lymphoma Father    Colon cancer Neg Hx    Colon polyps Neg Hx    Esophageal cancer Neg Hx    Rectal cancer Neg Hx    Stomach cancer Neg Hx     SOCIAL HISTORY: Social History   Socioeconomic History   Marital status: Married    Spouse name: Not on file   Number of children: Not on file   Years of education: Not on file   Highest education level: Some college, no degree  Occupational History   Not on file  Tobacco Use   Smoking status: Never   Smokeless tobacco: Never  Vaping Use   Vaping status: Never Used  Substance and Sexual Activity   Alcohol use: Not Currently   Drug use: Never   Sexual activity: Not on file  Other Topics Concern   Not on file  Social History Narrative   Not on file   Social Drivers of Health   Financial Resource Strain: Low Risk  (02/24/2024)   Overall Financial Resource Strain (CARDIA)     Difficulty of Paying Living Expenses: Not very hard  Food Insecurity: No Food Insecurity (04/21/2024)   Hunger Vital Sign    Worried About Running Out of Food in the Last Year: Never true    Ran Out of Food in the Last Year: Never true  Transportation Needs: No Transportation Needs (04/21/2024)   PRAPARE - Administrator, Civil Service (Medical): No    Lack of Transportation (Non-Medical): No  Physical Activity: Sufficiently Active (02/24/2024)   Exercise Vital Sign    Days of Exercise per Week: 3 days    Minutes of Exercise per Session: 60 min  Stress: No Stress Concern Present (02/24/2024)   [employer] of Occupational Health - Occupational Stress Questionnaire    Feeling of Stress: Not at all  Social Connections: Moderately Integrated (02/24/2024)   Social Connection and Isolation Panel    Frequency of Communication with Friends and Family: Three times a week    Frequency of Social Gatherings with Friends and Family: Once a week    Attends Religious Services: More than 4 times per year    Active Member of [employer] or Organizations: No    Attends Engineer, structural: Not on file    Marital Status: Married  Catering manager Violence: Not At Risk (04/21/2024)   Humiliation, Afraid, Rape, and Kick questionnaire    Fear of Current or Ex-Partner: No    Emotionally Abused: No    Physically Abused: No    Sexually Abused: No     PHYSICAL EXAM  There were no vitals filed for this visit. There is no height or weight on file to calculate BMI.  Generalized: Well developed, in no acute distress  Cardiology: normal rate and rhythm, no murmur noted Respiratory: clear to auscultation bilaterally  Neurological examination  Mentation: Alert oriented to time, place, history taking. Follows all commands speech and language fluent Cranial nerve II-XII: Pupils were equal round reactive to light. Extraocular movements were full, visual field were full  Motor: The motor  testing reveals 5 over 5 strength of all 4 extremities. Good symmetric motor tone is noted throughout.  Gait and station: Gait is normal.    DIAGNOSTIC DATA (LABS, IMAGING, TESTING) - I reviewed patient records, labs, notes, testing and imaging myself where available.      No data to display           Lab Results  Component Value Date   WBC 9.3 04/22/2024   HGB 13.9 04/22/2024   HCT 43.0 04/22/2024   MCV 99.3 04/22/2024   PLT 283 04/22/2024      Component Value Date/Time   NA 140 04/21/2024 1236   K 4.4 04/21/2024 1236   CL 108 04/21/2024 1236   CO2 20 (L) 04/21/2024 1236   GLUCOSE 157 (H) 04/21/2024 1236   BUN 17 04/21/2024 1236   CREATININE 0.94 04/21/2024 1236   CREATININE 1.13 02/19/2024 1425   CALCIUM  9.1 04/21/2024 1236   PROT 7.1 04/16/2024 1140   ALBUMIN 3.9 04/16/2024 1140   AST 24 04/16/2024 1140   ALT 11 04/16/2024 1140   ALKPHOS 59 04/16/2024 1140   BILITOT 0.9 04/16/2024 1140   GFRNONAA >60 04/21/2024 1236   Lab Results  Component Value Date   CHOL 326 (H) 02/19/2024   HDL 52 02/19/2024   LDLCALC 224 (H) 02/19/2024   LDLDIRECT 113.0 11/03/2022   TRIG 293 (H) 02/19/2024   CHOLHDL 6.3 (H) 02/19/2024   Lab Results  Component Value Date   HGBA1C 7.3 (H) 04/16/2024   No results found for: VITAMINB12 Lab Results  Component Value Date   TSH 1.19 08/24/2023     ASSESSMENT AND PLAN 58 y.o. year old male  has a past medical history of Arthritis, Diabetes (HCC), GERD (gastroesophageal reflux disease), Hyperlipidemia, Hypertension, and Trigeminal neuralgia. here with   No diagnosis found.    Gregory Fitzgerald is doing well on CPAP therapy. Compliance report reveals ***. *** was encouraged to continue using CPAP nightly and for greater than 4 hours each night. We will update supply orders as indicated. Risks of untreated sleep apnea review and education materials provided. Healthy lifestyle habits encouraged. *** will follow up in ***, sooner if  needed. *** verbalizes understanding and agreement with this plan.    No orders of the defined types were placed in this encounter.    No orders of the defined types were placed in this encounter.     Greig Forbes, FNP-C 04/22/2024, 3:48 PM [facility] [address], [address] [place], [place] [address] [phone]

## 2024-04-22 NOTE — Inpatient Diabetes Management (Signed)
 Inpatient Diabetes Program Recommendations  AACE/ADA: New Consensus Statement on Inpatient Glycemic Control (2015)  Target Ranges:  Prepandial:   less than 140 mg/dL      Peak postprandial:   less than 180 mg/dL (1-2 hours)      Critically ill patients:  140 - 180 mg/dL   Lab Results  Component Value Date   GLUCAP 126 (H) 04/21/2024   HGBA1C 7.3 (H) 04/16/2024    Review of Glycemic Control  Diabetes history: DM2 Outpatient Diabetes medications: Lantus  30 every day, Amaryl  2 mg QAM, Dapagliflozin  Pro-metformin  ER 11-998  2 tabs daily Current orders for Inpatient glycemic control: None  HgbA1C - 7.3%  Inpatient Diabetes Program Recommendations:    Discharge Recs:  Xigduo  XR 11-998 mg 2 tabs every day  Spoke with pt this morning regarding his diabetes control, HgbA1C of 7.3% and discontinuing home meds of Lantus  30 every day and Amaryl  2 mg QAM. Instructed to continue monitoring blood sugars daily and f/u with Endo in the next few weeks. Reviewed hypoglycemia s/s and treatment. Pt will be on liquid diet for 2 weeks and recommend treating hypos with 1/2 c juice or regular soda. Pt verbalized understanding. Looking forward to being off of insulin  now. Answered all questions/concerns. Ready for discharge.  Thank you. Shona Brandy, RD, LDN, CDCES Inpatient Diabetes Coordinator [phone]

## 2024-04-22 NOTE — Patient Instructions (Incomplete)

## 2024-04-22 NOTE — Progress Notes (Signed)
 Discharge mediations delivered to patient at bedside in a secure bag.

## 2024-04-22 NOTE — Progress Notes (Signed)
 Patient discharged home, IV removed, discharge paperwork provided and explained to patient, patient verbalized understanding, primary RN spoke with bariatric nurse coordinator, bariatric nurse coordinator stated patient is safe to discharge home from her standpoint.

## 2024-04-22 NOTE — Progress Notes (Signed)
 Patient alert and oriented, pain is controlled. Patient is tolerating fluids, advanced to protein shake today, patient is tolerating well. Reviewed Gastric sleeve/bypass discharge instructions with patient and patient is able to articulate understanding. Provided information on BELT program, Support Group, BSTOP-D, and [facility] outpatient pharmacy. Updated patient the [facility] pharmacy recommends patient to take Lovenox at 2 pm and 10 pm today and then start a 12 hour schedule tomorrow based on dose of heparin given today.  Communicated general update of patient status to surgeon. All questions answered. 24hr fluid recall is 1900 per hydration protocol, bariatric nurse coordinator to make follow-up phone call within one week.

## 2024-04-22 NOTE — Discharge Summary (Signed)
 Physician Discharge Summary  Gregory Fitzgerald FMW:[id number] DOB: 11-Jun-1966 DOA: 04/21/2024  PCP: Mahlon Comer BRAVO, MD  Admit date: 04/21/2024 Discharge date:  04/22/2024   Recommendations for Outpatient Follow-up:   (include homehealth, outpatient follow-up instructions, specific recommendations for PCP to follow-up on, etc.)   Follow-up Information     Jakavion Bilodeau, Herlene Righter, MD. Go on 05/16/2024.   Specialty: General Surgery Why: @ 130 pm Contact information: [address] [place] [address] [phone]         Maczis, Tonja [place], PA-C. Go on 06/17/2024.   Specialty: General Surgery Why: @ 230 pm Contact information: [address] [facility] [place] [address] [phone]                Discharge Diagnoses:  Principal Problem:   Morbid (severe) obesity due to excess calories [facility])   Surgical Procedure: laparoscopic sleeve gastrectomy, upper endoscopy  Discharge Condition: Good Disposition: Home  Diet recommendation: Postoperative sleeve gastrectomy diet (liquids only)  Filed Weights   04/21/24 0821 04/21/24 0825  Weight: (!) 154.7 kg (!) 154.7 kg     Hospital Course:  The patient was admitted after undergoing laparoscopic sleeve gastrectomy. POD 0 he ambulated well. POD 1 he was started on the water diet protocol and tolerated 440 ml in the first shift. Once meeting the water amount he was advanced to bariatric protein shakes which they tolerated and were discharged home POD 1.  Treatments: surgery: laparoscopic sleeve gastrectomy  Discharge Instructions  Discharge Instructions     Ambulate hourly while awake   Complete by: As directed    Call MD for:  difficulty breathing, headache or visual disturbances   Complete by: As directed    Call MD for:  persistant dizziness or light-headedness   Complete by: As directed    Call MD for:  persistant nausea and vomiting   Complete by: As  directed    Call MD for:  redness, tenderness, or signs of infection (pain, swelling, redness, odor or green/yellow discharge around incision site)   Complete by: As directed    Call MD for:  severe uncontrolled pain   Complete by: As directed    Call MD for:  temperature >101 F   Complete by: As directed    Diet bariatric full liquid   Complete by: As directed    Discharge wound care:   Complete by: As directed    Remove Bandaids tomorrow, ok to shower tomorrow. Steristrips may fall off in 1-3 weeks.   Incentive spirometry   Complete by: As directed    Perform hourly while awake      Allergies as of 04/22/2024   No Known Allergies      Medication List     STOP taking these medications    glimepiride  2 MG tablet Commonly known as: AMARYL    Insulin  Pen Needle 32G X 4 MM Misc   Insulin  Pen Needle 32G X 6 MM Misc Commonly known as: NovoFine   Lancets Misc. Misc   Lantus  SoloStar 100 UNIT/ML Solostar Pen Generic drug: insulin  glargine   Mobic 15 MG tablet Generic drug: meloxicam       TAKE these medications    Accu-Chek Aviva Plus w/Device Kit Use to check blood sugar twice a daily.   aspirin 81 MG tablet Take 81 mg by mouth daily.   blood glucose meter kit and supplies Kit Dispense based on patient and insurance preference. Use up to two  times daily as directed.   carbamazepine  200 MG tablet Commonly known as: TEGRETOL  Take 1 tablet (200 mg total) by mouth 2 (two) times daily.   clotrimazole -betamethasone  cream Commonly known as: LOTRISONE  APPLY TO AFFECTED AREA TWICE A DAY What changed: See the new instructions.   Dapagliflozin  Pro-metFORMIN  ER 11-998 MG Tb24 Commonly known as: Xigduo  XR Take 2 tablets by mouth daily.   enoxaparin 40 MG/0.4ML injection Commonly known as: LOVENOX Inject 0.4 mLs (40 mg total) into the skin every 12 (twelve) hours for 14 days.   ezetimibe  10 MG tablet Commonly known as: ZETIA  Take 1 tablet (10 mg total) by mouth  daily.   fluvastatin  XL 80 MG 24 hr tablet Commonly known as: Lescol  XL Take 1 tablet (80 mg total) by mouth daily.   FREESTYLE LITE test strip Generic drug: glucose blood USE AS INSTRUCTED TWICE A DAY   BLOOD GLUCOSE TEST STRIPS Strp 1 each by In Vitro route daily. May substitute to any manufacturer covered by patient's insurance.   gabapentin 100 MG capsule Commonly known as: NEURONTIN Take 1 capsule (100 mg total) by mouth every 12 (twelve) hours for 5 days.   lisinopril  40 MG tablet Commonly known as: ZESTRIL  Take 1 tablet (40 mg total) by mouth daily.   ondansetron 4 MG disintegrating tablet Commonly known as: ZOFRAN-ODT Take 1 tablet (4 mg total) by mouth every 6 (six) hours as needed for nausea or vomiting.   pantoprazole 40 MG tablet Commonly known as: PROTONIX Take 1 tablet (40 mg total) by mouth daily.   tadalafil  20 MG tablet Commonly known as: Cialis  TAKE 1 TABLET EVERY DAY AS NEEDED FOR ERECTILE DYSFUNCTION What changed:  how much to take how to take this when to take this reasons to take this               Discharge Care Instructions  (From admission, onward)           Start     Ordered   04/22/24 0000  Discharge wound care:       Comments: Remove Bandaids tomorrow, ok to shower tomorrow. Steristrips may fall off in 1-3 weeks.   04/22/24 0756            Follow-up Information     Zita Ozimek, Herlene Righter, MD. Go on 05/16/2024.   Specialty: General Surgery Why: @ 130 pm Contact information: [address] [place] [address] [phone]         Maczis, Tonja [place], PA-C. Go on 06/17/2024.   Specialty: General Surgery Why: @ 230 pm Contact information: [address] [facility] [place] [address] [phone]                  The results of significant diagnostics from this hospitalization (including imaging, microbiology, ancillary and laboratory) are listed below  for reference.    Significant Diagnostic Studies: No results found.  Labs: Basic Metabolic Panel: Recent Labs  Lab 04/16/24 1140 04/21/24 1236  NA 139 140  K 4.7 4.4  CL 105 108  CO2 22 20*  GLUCOSE 95 157*  BUN 22* 17  CREATININE 1.17 0.94  CALCIUM  9.6 9.1   Liver Function Tests: Recent Labs  Lab 04/16/24 1140  AST 24  ALT 11  ALKPHOS 59  BILITOT 0.9  PROT 7.1  ALBUMIN 3.9    CBC: Recent Labs  Lab 04/16/24 1140 04/21/24 1236 04/21/24 1425 04/22/24 0501  WBC 7.5 9.5  --  9.3  NEUTROABS 4.3  --   --  6.8  HGB 14.0 13.0 13.2 13.9  HCT 42.1 39.2 40.3 43.0  MCV 97.7 98.7  --  99.3  PLT 258 244  --  283    CBG: Recent Labs  Lab 04/16/24 1110 04/21/24 0820 04/21/24 1027 04/21/24 1202  GLUCAP 98 144* 118* 126*    Principal Problem:   Morbid (severe) obesity due to excess calories [facility])   VTE plan: I will prescribe outpatient chemical prophylaxis of enoxaparin due to this increased risk ([email or web address])  Time coordinating discharge: 15 min

## 2024-04-23 LAB — SURGICAL PATHOLOGY

## 2024-04-23 NOTE — Progress Notes (Unsigned)
 Gregory Fitzgerald

## 2024-04-24 ENCOUNTER — Encounter: Payer: Self-pay | Admitting: Family Medicine

## 2024-04-24 ENCOUNTER — Telehealth: Payer: Self-pay | Admitting: Neurology

## 2024-04-24 ENCOUNTER — Ambulatory Visit: Admitting: Family Medicine

## 2024-04-24 VITALS — BP 120/81 | HR 94 | Ht 72.0 in | Wt 337.0 lb

## 2024-04-24 DIAGNOSIS — G4733 Obstructive sleep apnea (adult) (pediatric): Secondary | ICD-10-CM | POA: Diagnosis not present

## 2024-04-24 NOTE — Telephone Encounter (Signed)
 Patient called to cancel appointment same day.  Informed patient would be a no show fee of $50 due to cancelling same day and need to transfer to Billing to pay the fee. Patient said, I'll come to the appointment.  Ask if will be able to get to the office by 9:00 am. Patient stated, will try to.

## 2024-04-27 ENCOUNTER — Telehealth (HOSPITAL_COMMUNITY): Payer: Self-pay

## 2024-04-28 LAB — OPHTHALMOLOGY REPORT-SCANNED

## 2024-04-29 NOTE — Telephone Encounter (Signed)
 1. Tell me about your pain and pain management?     Pt denies any pain.    2. Let's talk about fluid intake. How much total fluid are you taking in?   Pt states that s/he is getting in at least 53 oz of fluid including protein shakes, bottled water, and gatorade zero  Pt states that s/he is working to meet goal of 64 oz of fluid today. Pt encouraged to continue to work towards meeting goal. Pt instructed to assess status and suggestions daily utilizing Hydration Action Plan on discharge folder and to call [facility] if in the red zone.   3. How much protein have you taken in the last day?      Pt states he is meeting his goal of 90g of protein each day with the protein shakes    4. Have you had nausea? Tell me about when you have experienced nausea and what you did to help?   Pt denies nausea.   5. Has the frequency or color changed with your urine?   Pt states that s/he is urinating fine with no changes in frequency or urgency. Does state darker at times, encouraged to increase clear fluids as much of his fluid intake is from protein shake. Denies headache dizziness or lightheadness  6. Tell me what your incisions look like?   Incisions look fine. Pt denies a fever, chills. Pt states incisions are not swollen, open, or draining. Pt encouraged to call [facility] if incisions change.    7. Have you been passing gas? BM?   Pt states that they are having BMs. Last BM 04/28/2024  Pt states that they have had a BM. Pt instructed to take either Miralax or MoM as instructed per Gastric Bypass/Sleeve Discharge Home Care Instructions. Pt to call surgeon's office if not able to have BM with medication.      8. If a problem or question were to arise who would you call? Do you know contact numbers for [facility], [facility], and [facility]?   Pt knows to call [facility] for surgical, [facility] for nutrition, and [facility] for non-urgent questions or concerns. Pt denies dehydration symptoms. Pt can describe s/sx of dehydration.   9.  How has the walking going?   Pt states s/he is walking around and able to be active without difficulty.   10. Are you still using your incentive spirometer? If so, how often?   Pt states that s/he is doing the I.S. Pt encouraged to use incentive spirometer, at least 10x every hour while awake until s/he sees the surgeon.   11. How are your vitamins and calcium  going? How are you taking them?     Pt states that s/he is taking his/her supplements and vitamins without difficulty.     12. How has the anticoagulant Lovenox been going?   LOVENOX: Pt states that s/he is taking the Lovenox injections without difficulty. Reinforced education about taking injections q12h and rotating injection sites. Pt also instructed to monitor for unusual bruising and/or signs of bleeding. Reminded not to take ASA 81 mg   Reminded patient that the first 30 days post-operatively are important for successful recovery. Practice good hand hygiene, and minimizing exposure to people who live outside of the home, especially if they are exhibiting any respiratory, GI, or illness-like symptoms.

## 2024-05-06 ENCOUNTER — Encounter: Payer: Self-pay | Admitting: Dietician

## 2024-05-06 ENCOUNTER — Encounter: Attending: General Surgery | Admitting: Dietician

## 2024-05-06 VITALS — Ht 72.0 in | Wt 315.5 lb

## 2024-05-06 DIAGNOSIS — E669 Obesity, unspecified: Secondary | ICD-10-CM | POA: Insufficient documentation

## 2024-05-06 NOTE — Progress Notes (Signed)
 2 Week Post-Operative Nutrition Class   Patient was seen on 10/14/20285 for Post-Operative Nutrition education at the [facility].    Surgery date: 04/21/2024 Surgery type: Sleeve Gastrectomy  Anthropometrics  Start weight at [facility]: 333.4 lbs (date: 12/11/2023)  Height: 72 in Weight today: 315.5 lbs   Clinical  Medical hx: T2DM  Notable signs/symptoms: none noted Any previous deficiencies? No Bowel Habits: Every day to every other day no complaints   Body Composition Scale 05/06/2024  Current Body Weight 315.5  Total Body Fat % 37.3  Visceral Fat 32  Fat-Free Mass % 62.6   Total Body Water % 43.6  Muscle-Mass lbs 59.5  BMI 42.9  Body Fat Displacement          Torso  lbs 73.1         Left Leg  lbs 14.6         Right Leg  lbs 14.6         Left Arm  lbs 7.3         Right Arm  lbs 7.3    The following the learning objectives were met by the patient during this course: Identifies Soft Prepped Plan Advancement Guide  Identifies Soft, High Proteins (Phase 1), beginning 2 weeks post-operatively to 3 weeks post-operatively Identifies Additional Soft High Proteins, soft non-starchy vegetables, fruits and starches (Phase 2), beginning 3 weeks post-operatively to 3 months post-operatively Identifies appropriate sources of fluids, proteins, vegetables, fruits and starches Identifies appropriate fat sources and healthy verses unhealthy fat types   States protein, vegetable, fruit and starch recommendations and appropriate sources post-operatively Identifies the need for appropriate texture modifications, mastication, and bite sizes when consuming solids Identifies appropriate fat consumption and sources Identifies appropriate multivitamin and calcium  sources post-operatively Describes the need for physical activity post-operatively and will follow MD recommendations States when to call healthcare provider regarding medication questions or post-operative  complications   Handouts given during class include: Soft Prepped Plan Advancement Guide   Follow-Up Plan: Patient will follow-up at [facility] in 10 weeks for 3 month post-op nutrition visit for diet advancement per MD.

## 2024-05-07 ENCOUNTER — Ambulatory Visit: Admitting: Endocrinology

## 2024-05-07 ENCOUNTER — Encounter: Payer: Self-pay | Admitting: Endocrinology

## 2024-05-07 VITALS — BP 124/80 | HR 70 | Resp 16 | Ht 72.0 in | Wt 316.2 lb

## 2024-05-07 DIAGNOSIS — E119 Type 2 diabetes mellitus without complications: Secondary | ICD-10-CM

## 2024-05-07 DIAGNOSIS — E669 Obesity, unspecified: Secondary | ICD-10-CM

## 2024-05-07 DIAGNOSIS — Z23 Encounter for immunization: Secondary | ICD-10-CM | POA: Diagnosis not present

## 2024-05-07 DIAGNOSIS — Z7984 Long term (current) use of oral hypoglycemic drugs: Secondary | ICD-10-CM | POA: Diagnosis not present

## 2024-05-07 MED ORDER — FREESTYLE LITE TEST VI STRP: ORAL_STRIP | 3 refills | Status: AC

## 2024-05-07 NOTE — Progress Notes (Signed)
 Outpatient Endocrinology Note Gregory Maurico Perrell, MD  05/07/24  Patient's Name: Gregory Fitzgerald    DOB: [birth year]    MRN: [id number]                                                    REASON OF VISIT: Follow up for type 2 diabetes mellitus  PCP: Mahlon Comer BRAVO, MD  HISTORY OF PRESENT ILLNESS:   Gregory Fitzgerald is a 58 y.o. old male with past medical history listed below, is here for follow up for type 2 diabetes mellitus.    Pertinent Diabetes History: Patient was previously seen by Dr. Von and was last time seen in July 2024.  Patient was diagnosed with type 2 diabetes mellitus in 2006.  No personal history of pancreatitis and / or family history of medullary thyroid  carcinoma or MEN 2B syndrome.   Patient underwent laparoscopic sleeve gastrectomy in April 21, 2024.  Chronic Diabetes Complications : Retinopathy: no. Last ophthalmology exam was done on annually, following with ophthalmology regularly.  Nephropathy: Microalbuminuria present, on ACE/ARB /lisinopril  and dapagliflozin . Peripheral neuropathy: no Coronary artery disease: no Stroke: no  Relevant comorbidities and cardiovascular risk factors: Obesity: yes Body mass index is 42.88 kg/m.  Hypertension: Yes  Hyperlipidemia : Yes, on statin   Treated with Lipitor 20 mg previously but this was stopped because of leg stiffness; also may have had some side effects of leg stiffness from Crestor     He has no leg stiffness or aching with Lescol  XL, taking 80 mg   Current / Home Diabetic regimen includes:  Xigduo  XR 11/998 mg, 2 tablets daily.   Prior diabetic medications: He used to be on glimepiride , Victoza , Invokana  in the past.  There was plan to start basal insulin  in June 2024 however he never started.  He had taken Ozempic  and Mounjaro  in the past.  Glycemic data:   He has been using freestyle libre glucometer.  Not able to download glucose in the clinic today, reviewed directly from the meter shows the blood  sugar are 95, 77, 79, 92, 91, 95, 102, 97 in last 1 week.  Hypoglycemia: Patient has no hypoglycemic episodes. Patient has hypoglycemia awareness.  Factors modifying glucose control: 1.  Diabetic diet assessment: 3 meals a day.  2.  Staying active or exercising: Exercising in the gym 4 days a week.  3.  Medication compliance: compliant all of the time.  Interval history  Patient underwent bariatric surgery sleeve gastrectomy in September, recovered well, now he started soft diet.  He reports he lost about ~ 10 pounds of weight so far.  Blood sugar data as reviewed above mostly acceptable.  He has been taking Xigduo .  Patient was told by bariatric team that he does not have to cross the pills.  Hemoglobin A1c improved to 7.3% in September improved from > 14% in July.  He is no longer taking glimepiride  and Ozempic .  No other complaints today.  He wants to have flu shot in the clinic today.  REVIEW OF SYSTEMS As per history of present illness.   PAST MEDICAL HISTORY: Past Medical History:  Diagnosis Date   Arthritis    knees   Diabetes (HCC)    GERD (gastroesophageal reflux disease)    years ago, not currently    Hyperlipidemia    Hypertension  Trigeminal neuralgia     PAST SURGICAL HISTORY: Past Surgical History:  Procedure Laterality Date   club foot surgery     age 78 months   COLONOSCOPY     KNEE SURGERY     x 2 in high school    LAPAROSCOPIC GASTRIC SLEEVE RESECTION N/A 04/21/2024   Procedure: GASTRECTOMY, SLEEVE, LAPAROSCOPIC;  Surgeon: Kinsinger, Herlene Righter, MD;  Location: [facility];  Service: General;  Laterality: N/A;   UPPER GI ENDOSCOPY N/A 04/21/2024   Procedure: ENDOSCOPY, UPPER GI TRACT;  Surgeon: Kinsinger, Herlene Righter, MD;  Location: [facility];  Service: General;  Laterality: N/A;    ALLERGIES: No Known Allergies  FAMILY HISTORY:  Family History  Problem Relation Age of Onset   Diabetes Mother    Diabetes Father    Lymphoma Father    Colon cancer Neg Hx     Colon polyps Neg Hx    Esophageal cancer Neg Hx    Rectal cancer Neg Hx    Stomach cancer Neg Hx     SOCIAL HISTORY: Social History   Socioeconomic History   Marital status: Married    Spouse name: Not on file   Number of children: Not on file   Years of education: Not on file   Highest education level: Some college, no degree  Occupational History   Not on file  Tobacco Use   Smoking status: Never   Smokeless tobacco: Never  Vaping Use   Vaping status: Never Used  Substance and Sexual Activity   Alcohol use: Not Currently   Drug use: Never   Sexual activity: Not on file  Other Topics Concern   Not on file  Social History Narrative   Not on file   Social Drivers of Health   Financial Resource Strain: Low Risk  (02/24/2024)   Overall Financial Resource Strain (CARDIA)    Difficulty of Paying Living Expenses: Not very hard  Food Insecurity: No Food Insecurity (04/21/2024)   Hunger Vital Sign    Worried About Running Out of Food in the Last Year: Never true    Ran Out of Food in the Last Year: Never true  Transportation Needs: No Transportation Needs (04/21/2024)   PRAPARE - Administrator, Civil Service (Medical): No    Lack of Transportation (Non-Medical): No  Physical Activity: Sufficiently Active (02/24/2024)   Exercise Vital Sign    Days of Exercise per Week: 3 days    Minutes of Exercise per Session: 60 min  Stress: No Stress Concern Present (02/24/2024)   [employer] of Occupational Health - Occupational Stress Questionnaire    Feeling of Stress: Not at all  Social Connections: Moderately Integrated (02/24/2024)   Social Connection and Isolation Panel    Frequency of Communication with Friends and Family: Three times a week    Frequency of Social Gatherings with Friends and Family: Once a week    Attends Religious Services: More than 4 times per year    Active Member of [employer] or Organizations: No    Attends Engineer, structural: Not on file     Marital Status: Married    MEDICATIONS:  Current Outpatient Medications  Medication Sig Dispense Refill   aspirin 81 MG tablet Take 81 mg by mouth daily.     blood glucose meter kit and supplies KIT Dispense based on patient and insurance preference. Use up to two times daily as directed. 1 each 0   Blood Glucose Monitoring Suppl (ACCU-CHEK AVIVA PLUS)  w/Device KIT Use to check blood sugar twice a daily. 1 kit 0   carbamazepine  (TEGRETOL ) 200 MG tablet Take 1 tablet (200 mg total) by mouth 2 (two) times daily. 180 tablet 1   clotrimazole -betamethasone  (LOTRISONE ) cream APPLY TO AFFECTED AREA TWICE A DAY 45 g 0   Dapagliflozin  Pro-metFORMIN  ER (XIGDUO  XR) 11-998 MG TB24 Take 2 tablets by mouth daily. 180 tablet 3   enoxaparin (LOVENOX) 40 MG/0.4ML injection Inject 0.4 mLs (40 mg total) into the skin every 12 (twelve) hours for 14 days. 11.2 mL 0   ezetimibe  (ZETIA ) 10 MG tablet Take 1 tablet (10 mg total) by mouth daily. 90 tablet 3   fluvastatin  XL (LESCOL  XL) 80 MG 24 hr tablet Take 1 tablet (80 mg total) by mouth daily. 90 tablet 3   Glucose Blood (BLOOD GLUCOSE TEST STRIPS) STRP 1 each by In Vitro route daily. May substitute to any manufacturer covered by patient's insurance. 100 each 3   glucose blood (FREESTYLE LITE) test strip USE TWICE A DAY to check blood glucose 100 strip 3   lisinopril  (ZESTRIL ) 40 MG tablet Take 1 tablet (40 mg total) by mouth daily. 90 tablet 3   ondansetron (ZOFRAN-ODT) 4 MG disintegrating tablet Dissolve 1 tablet (4 mg total) by mouth every 6 (six) hours as needed for nausea or vomiting. 20 tablet 0   pantoprazole (PROTONIX) 40 MG tablet Take 1 tablet (40 mg total) by mouth daily. 90 tablet 0   tadalafil  (CIALIS ) 20 MG tablet TAKE 1 TABLET EVERY DAY AS NEEDED FOR ERECTILE DYSFUNCTION 10 tablet 2   No current facility-administered medications for this visit.    PHYSICAL EXAM: Vitals:   05/07/24 0957  BP: 124/80  Pulse: 70  Resp: 16  SpO2: 95%  Weight:  (!) 316 lb 3.2 oz (143.4 kg)  Height: 6' (1.829 m)      Body mass index is 42.88 kg/m.  Wt Readings from Last 3 Encounters:  05/07/24 (!) 316 lb 3.2 oz (143.4 kg)  05/06/24 (!) 315 lb 8 oz (143.1 kg)  04/24/24 (!) 337 lb (152.9 kg)    General: Well developed, well nourished male in no apparent distress.  HEENT: AT/[place], no external lesions.  Eyes: Conjunctiva clear and no icterus. Neck: Neck supple  Lungs: Respirations not labored Neurologic: Alert, oriented, normal speech Extremities / Skin: Dry.   Psychiatric: Does not appear depressed or anxious  Diabetic Foot Exam - Simple   No data filed    LABS Reviewed Lab Results  Component Value Date   HGBA1C 7.3 (H) 04/16/2024   HGBA1C >14.0 (H) 02/19/2024   HGBA1C 5.9 (A) 10/16/2023   Lab Results  Component Value Date   FRUCTOSAMINE 311 (H) 01/04/2023   FRUCTOSAMINE 403 (H) 03/08/2020   FRUCTOSAMINE 239 12/19/2017   Lab Results  Component Value Date   CHOL 326 (H) 02/19/2024   HDL 52 02/19/2024   LDLCALC 224 (H) 02/19/2024   LDLDIRECT 113.0 11/03/2022   TRIG 293 (H) 02/19/2024   CHOLHDL 6.3 (H) 02/19/2024   Lab Results  Component Value Date   MICRALBCREAT 859 (H) 02/19/2024   Lab Results  Component Value Date   CREATININE 0.94 04/21/2024   Lab Results  Component Value Date   GFR 77.27 08/24/2023    ASSESSMENT / PLAN  1. Encounter for immunization   2. Type 2 diabetes mellitus in patient with obesity (HCC)    Diabetes Mellitus type 2, complicated by microalbuminuria. - Diabetic status / severity: Uncontrolled, improving.  Lab Results  Component Value Date   HGBA1C 7.3 (H) 04/16/2024    - Hemoglobin A1c goal : <6.5%  Patient underwent sleeve gastrectomy in September.  Blood sugar are acceptable.  He started to lose weight, lost about 10 pounds of weight so far after surgery.   - Medications: See below  I) continue Xigduo  XR 11/998 mg 2 tablets daily.  Consider GLP-1 receptor agonist in the  future if no optimal weight loss after bariatric surgery.  - Home glucose testing: advised to monitor blood sugar at least 2-3 times a day.  - Discussed/ Gave Hypoglycemia treatment plan.  # Consult : not required at this time.   # Annual urine for microalbuminuria/ creatinine ratio,  microalbuminuria currently, continue ACE/ARB /lisinopril  and dapagliflozin .  Elevated urine microalbumin creatinine ratio likely related to uncontrolled diabetes mellitus.  Last  Lab Results  Component Value Date   MICRALBCREAT 859 (H) 02/19/2024    # Foot check nightly.  # Annual dilated diabetic eye exams.   - Diet: Make healthy diabetic food choices - Life style / activity / exercise: Discussed.  2. Blood pressure  -  BP Readings from Last 1 Encounters:  05/07/24 124/80    - Control is in target.  - No change in current plans.  3. Lipid status / Hyperlipidemia - Last  Lab Results  Component Value Date   LDLCALC 224 (H) 02/19/2024   - Continue Escol XL, taking 80 mg daily.  Zetia  10 mg daily.  Advised for compliance with these medications.    Diagnoses and all orders for this visit:  Encounter for immunization -     Flu vaccine trivalent PF, 6mos and older(Flulaval,Afluria,Fluarix,Fluzone)  Type 2 diabetes mellitus in patient with obesity (HCC) -     glucose blood (FREESTYLE LITE) test strip; USE TWICE A DAY to check blood glucose -     Basic metabolic panel with GFR -     Hemoglobin A1c -     Microalbumin / creatinine urine ratio -     Lipid panel    DISPOSITION Follow up in clinic in 3 months suggested.  Labs prior to follow-up visit as ordered.   All questions answered and patient verbalized understanding of the plan.  Gregory Nastashia Gallo, MD [facility] Endocrinology [facility] [address] [place], [address] [place], [place] [address] Phone # [phone]  At least part of this note was generated using voice recognition software. Inadvertent word errors may have  occurred, which were not recognized during the proofreading process.

## 2024-05-15 ENCOUNTER — Telehealth: Payer: Self-pay | Admitting: Dietician

## 2024-05-15 NOTE — Telephone Encounter (Signed)
 RD called pt to verify fluid intake once starting soft, solid proteins 2 week post-bariatric surgery.   Daily Fluid intake: 64 oz Daily Protein intake: 80 grams per day Bowel Habits: soft stools, daily or every other day  Concerns/issues: larger incision is a little tender. Pt states he sees his surgeon tomorrow. No other issues or concerns.

## 2024-05-22 ENCOUNTER — Encounter: Payer: Self-pay | Admitting: Family Medicine

## 2024-05-22 ENCOUNTER — Ambulatory Visit: Payer: Self-pay | Admitting: Family Medicine

## 2024-05-22 ENCOUNTER — Ambulatory Visit: Admitting: Family Medicine

## 2024-05-22 VITALS — BP 128/64 | HR 68 | Temp 98.0°F | Ht 72.0 in | Wt 304.5 lb

## 2024-05-22 DIAGNOSIS — E119 Type 2 diabetes mellitus without complications: Secondary | ICD-10-CM

## 2024-05-22 DIAGNOSIS — E7800 Pure hypercholesterolemia, unspecified: Secondary | ICD-10-CM

## 2024-05-22 DIAGNOSIS — I10 Essential (primary) hypertension: Secondary | ICD-10-CM

## 2024-05-22 LAB — HEPATIC FUNCTION PANEL
ALT: 10 U/L (ref 0–53)
AST: 19 U/L (ref 0–37)
Albumin: 4.1 g/dL (ref 3.5–5.2)
Alkaline Phosphatase: 53 U/L (ref 39–117)
Bilirubin, Direct: 0.2 mg/dL (ref 0.0–0.3)
Total Bilirubin: 0.5 mg/dL (ref 0.2–1.2)
Total Protein: 7.3 g/dL (ref 6.0–8.3)

## 2024-05-22 LAB — CBC WITH DIFFERENTIAL/PLATELET
Basophils Absolute: 0 K/uL (ref 0.0–0.1)
Basophils Relative: 0.7 % (ref 0.0–3.0)
Eosinophils Absolute: 0.2 K/uL (ref 0.0–0.7)
Eosinophils Relative: 5 % (ref 0.0–5.0)
HCT: 39.2 % (ref 39.0–52.0)
Hemoglobin: 13.1 g/dL (ref 13.0–17.0)
Lymphocytes Relative: 33.3 % (ref 12.0–46.0)
Lymphs Abs: 1.5 K/uL (ref 0.7–4.0)
MCHC: 33.3 g/dL (ref 30.0–36.0)
MCV: 97.6 fl (ref 78.0–100.0)
Monocytes Absolute: 0.4 K/uL (ref 0.1–1.0)
Monocytes Relative: 8.8 % (ref 3.0–12.0)
Neutro Abs: 2.3 K/uL (ref 1.4–7.7)
Neutrophils Relative %: 52.2 % (ref 43.0–77.0)
Platelets: 208 K/uL (ref 150.0–400.0)
RBC: 4.02 Mil/uL — ABNORMAL LOW (ref 4.22–5.81)
RDW: 14.5 % (ref 11.5–15.5)
WBC: 4.4 K/uL (ref 4.0–10.5)

## 2024-05-22 LAB — LIPID PANEL
Cholesterol: 129 mg/dL (ref 0–200)
HDL: 41.2 mg/dL (ref >39.00)
LDL Cholesterol: 70 mg/dL (ref 0–99)
NonHDL: 87.43
Total CHOL/HDL Ratio: 3
Triglycerides: 89 mg/dL (ref 0.0–149.0)
VLDL: 17.8 mg/dL (ref 0.0–40.0)

## 2024-05-22 LAB — BASIC METABOLIC PANEL WITH GFR
BUN: 17 mg/dL (ref 6–23)
CO2: 26 meq/L (ref 19–32)
Calcium: 9.3 mg/dL (ref 8.4–10.5)
Chloride: 102 meq/L (ref 96–112)
Creatinine, Ser: 0.95 mg/dL (ref 0.40–1.50)
GFR: 89.65 mL/min (ref >60.00)
Glucose, Bld: 92 mg/dL (ref 70–99)
Potassium: 4 meq/L (ref 3.5–5.1)
Sodium: 141 meq/L (ref 135–145)

## 2024-05-22 LAB — TSH: TSH: 0.94 u[IU]/mL (ref 0.35–5.50)

## 2024-05-22 NOTE — Assessment & Plan Note (Signed)
 Chronic problem.  On Xigduo  XR.  Following w/ Endo.  UTD on eye exam, foot exam, microalbumin.  Recent A1C 7.3%.  Applauded recent weight loss.  Will continue to follow along.

## 2024-05-22 NOTE — Progress Notes (Signed)
   Subjective:    Patient ID: Gregory Fitzgerald, male    DOB: 1965/09/26, 58 y.o.   MRN: [id number]  HPI DM- chronic problem, on Xigduo  XR 10-2000mg  daily.  Last saw Endo on 8/5.  A1C 7.3% on 9/24.  UTD on foot exam, eye exam, microalbumin.  HTN- chronic problem, on Lisinopril  40mg  daily w/ good control.  No CP, SOB, HA's, visual changes, edema.  Hyperlipidemia- chronic problem, on Zetia  10mg  daily, Fluvastatin  80mg  daily.  No abd pain, N/V.  Obesity- pt is down 22 lbs since last visit s/p sleeve gastrectomy on 9/29.  BMI now 41.3   Review of Systems For ROS see HPI     Objective:   Physical Exam Vitals reviewed.  Constitutional:      General: He is not in acute distress.    Appearance: Normal appearance. He is well-developed. He is obese. He is not ill-appearing.  HENT:     Head: Normocephalic and atraumatic.  Eyes:     Extraocular Movements: Extraocular movements intact.     Conjunctiva/sclera: Conjunctivae normal.     Pupils: Pupils are equal, round, and reactive to light.  Neck:     Thyroid : No thyromegaly.  Cardiovascular:     Rate and Rhythm: Normal rate and regular rhythm.     Pulses: Normal pulses.     Heart sounds: Normal heart sounds. No murmur heard. Pulmonary:     Effort: Pulmonary effort is normal. No respiratory distress.     Breath sounds: Normal breath sounds.  Abdominal:     General: Bowel sounds are normal. There is no distension.     Palpations: Abdomen is soft.  Musculoskeletal:     Cervical back: Normal range of motion and neck supple.     Right lower leg: No edema.     Left lower leg: No edema.  Lymphadenopathy:     Cervical: No cervical adenopathy.  Skin:    General: Skin is warm and dry.  Neurological:     General: No focal deficit present.     Mental Status: He is alert and oriented to person, place, and time.     Cranial Nerves: No cranial nerve deficit.  Psychiatric:        Mood and Affect: Mood normal.        Behavior: Behavior normal.            Assessment & Plan:

## 2024-05-22 NOTE — Patient Instructions (Signed)
 Schedule your complete physical in early Feb We'll notify you of your lab results and make any changes if needed Continue to work on healthy diet and regular exercise- you can do it! Call with any questions or concerns Stay Safe!  Stay Healthy! I'm so proud of you!!!

## 2024-05-22 NOTE — Assessment & Plan Note (Signed)
 Chronic problem.  Currently on Zetia  10mg  daily and Fluvastatin  80mg  daily w/o difficulty.  Check labs.  Adjust meds prn

## 2024-05-22 NOTE — Assessment & Plan Note (Signed)
 Chronic problem.  Well controlled on Lisinopril  40mg  daily.  Currently asymptomatic.  Check labs due to ACE use but no anticipated med changes.

## 2024-05-22 NOTE — Assessment & Plan Note (Signed)
 Ongoing issue.  Pt had gastric sleeve on 9/29 and is down 22 lbs.  Applauded his efforts.  Will continue to follow

## 2024-07-07 ENCOUNTER — Other Ambulatory Visit: Payer: Self-pay | Admitting: Endocrinology

## 2024-07-21 ENCOUNTER — Ambulatory Visit: Admitting: Dietician

## 2024-07-21 ENCOUNTER — Encounter: Payer: Self-pay | Admitting: Dietician

## 2024-07-21 ENCOUNTER — Encounter: Attending: General Surgery | Admitting: Dietician

## 2024-07-21 VITALS — Ht 72.0 in | Wt 271.8 lb

## 2024-07-21 DIAGNOSIS — E669 Obesity, unspecified: Secondary | ICD-10-CM | POA: Diagnosis present

## 2024-07-21 DIAGNOSIS — E119 Type 2 diabetes mellitus without complications: Secondary | ICD-10-CM | POA: Insufficient documentation

## 2024-07-21 NOTE — Progress Notes (Signed)
 Bariatric Nutrition Follow-Up Visit Medical Nutrition Therapy  Appt Start Time: 1354   End Time: 1440  Surgery date: 04/21/2024 Surgery type: Sleeve Gastrectomy  NUTRITION ASSESSMENT  Anthropometrics  Start weight at [facility]: 333.4 lbs (date: 12/11/2023)  Height: 72 in Weight today: 271.8 lbs   Clinical  Medical hx: T2DM  Notable signs/symptoms: none noted Any previous deficiencies? No Bowel Habits: Every day to every other day no complaints   Body Composition Scale 05/06/2024 07/21/2024  Current Body Weight 315.5 271.8  Total Body Fat % 37.3 32.8  Visceral Fat 32 25  Fat-Free Mass % 62.6 67.1   Total Body Water  % 43.6 48.1  Muscle-Mass lbs 59.5 51.2  BMI 42.9 36.9  Body Fat Displacement           Torso  lbs 73.1 55.3         Left Leg  lbs 14.6 11.0         Right Leg  lbs 14.6 11.0         Left Arm  lbs 7.3 5.5         Right Arm  lbs 7.3 5.5     Lifestyle & Dietary Hx Pt states he started back lifting weights today, stating his wife told him he needs to lift. Pt states he was working 12 hour days, stating he has a couple weeks off to start physical activity again.  Estimated daily fluid intake: 96 oz Estimated daily protein intake: 80+ g Supplements: multivitamins and calcium  Current average weekly physical activity: ADLs (12 hour shifts at work ([employer]))  24-Hr Dietary Recall First Meal: scrambled eggs (2) Snack: protein shake (30 grams protein)  Second Meal: baked fish, collards (sometimes) Snack: protein shake  Third Meal: meat (grilled chicken or baked fish) and vegetable Snack: protein shake Beverages: water , water  with flavorings  Post-Op Goals/ Signs/ Symptoms Using straws: no Drinking while eating: no Chewing/swallowing difficulties: no Changes in vision: no Changes to mood/headaches: no Hair loss/changes to skin/nails: no Difficulty focusing/concentrating: no Sweating: no Limb weakness: no Dizziness/lightheadedness: no Palpitations: no   Carbonated/caffeinated beverages: no N/V/D/C/Gas: one episode with cooked broccoli Abdominal pain: no Dumping syndrome: no   NUTRITION DIAGNOSIS  Overweight/obesity (-3.3) related to past poor dietary habits and physical inactivity as evidenced by completed bariatric surgery and following dietary guidelines for continued weight loss and healthy nutrition status.   NUTRITION INTERVENTION Nutrition counseling (C-1) and education (E-2) to facilitate bariatric surgery goals, including: Diet advancement to the standard prep plan The importance of consuming adequate calories as well as certain nutrients daily due to the body's need for essential vitamins, minerals, and fats The importance of daily physical activity and to reach a goal of at least 150 minutes of moderate to vigorous physical activity weekly (or as directed by their physician) due to benefits such as increased musculature and improved lab values The importance of intuitive eating specifically learning hunger-satiety cues and understanding the importance of learning a new body: The importance of mindful eating to avoid grazing behaviors Increasing your physical activity now is a powerful way to protect your muscle mass as you prepare for bariatric surgery. Aiming for four days a week with 20-30 minutes on the elliptical builds cardiovascular endurance and helps with calorie burn, while adding about 40 minutes of resistance training strengthens your muscles and supports your metabolism. Because bariatric surgery leads to rapid weight loss, having a strong fitness routine in place reduces the risk of muscle loss and helps your body recover  more smoothly. These habits also set the stage for long-term success by improving strength, energy, and overall physical resilience. Including complex carbohydrates and non-starchy vegetables alongside your protein helps create balanced meals that support your energy, digestion, and muscle health. Complex  carbs--like whole grains, beans, and vegetables--digest more slowly, providing steady fuel and helping your body spare muscle tissue by preventing it from being broken down for energy. Pairing them with protein also improves satiety and keeps blood sugar more stable. This combination is especially helpful after bariatric surgery, since maintaining muscle mass is key for recovery and long-term success. Taking calcium  three times per day helps your body absorb it more effectively, since calcium  is best taken in smaller, divided doses rather than all at once. This pattern supports bone health, nerve function, and muscle strength, which are especially important after bariatric surgery when nutrient absorption can change. Spreading your intake throughout the day also helps maintain steadier levels in your system and reduces the chance of stomach discomfort.  Goals Increase physical activity; aim for 4 days, 20-30 minutes on the elliptical, and 40 minutes resistance exercises (weight training) Include complex carbohydrates and non-starchy vegetables with proteins. Carbohydrates are muscle sparing. Start taking calcium ; take 3 times per day  Handouts Provided Include  Body Comp Scale results Standard Prep Plan Advancement Guide  Learning Style & Readiness for Change Teaching method utilized: Visual & Auditory  Demonstrated degree of understanding via: Teach Back  Readiness Level: preparation Barriers to learning/adherence to lifestyle change: nothing identified  RD's Notes for Next Visit Assess adherence to pt chosen goals  MONITORING & EVALUATION Dietary intake, weekly physical activity, body weight. This note has been created using automated tools and reviewed for accuracy.   Next Steps Patient is to follow-up in 3 months for 6 month post-op follow-up.

## 2024-08-07 ENCOUNTER — Other Ambulatory Visit

## 2024-08-08 ENCOUNTER — Ambulatory Visit: Payer: Self-pay | Admitting: Endocrinology

## 2024-08-08 LAB — MICROALBUMIN / CREATININE URINE RATIO
Creatinine, Urine: 87 mg/dL (ref 20–320)
Microalb Creat Ratio: 1966 mg/g{creat} — ABNORMAL HIGH
Microalb, Ur: 171 mg/dL

## 2024-08-08 LAB — LIPID PANEL
Cholesterol: 164 mg/dL
HDL: 58 mg/dL
LDL Cholesterol (Calc): 90 mg/dL
Non-HDL Cholesterol (Calc): 106 mg/dL
Total CHOL/HDL Ratio: 2.8 (calc)
Triglycerides: 74 mg/dL

## 2024-08-08 LAB — BASIC METABOLIC PANEL WITH GFR
BUN: 18 mg/dL (ref 7–25)
CO2: 28 mmol/L (ref 20–32)
Calcium: 9.5 mg/dL (ref 8.6–10.3)
Chloride: 103 mmol/L (ref 98–110)
Creat: 0.83 mg/dL (ref 0.70–1.30)
Glucose, Bld: 92 mg/dL (ref 65–99)
Potassium: 4.2 mmol/L (ref 3.5–5.3)
Sodium: 140 mmol/L (ref 135–146)
eGFR: 103 mL/min/1.73m2

## 2024-08-08 LAB — HEMOGLOBIN A1C
Hgb A1c MFr Bld: 5 %
Mean Plasma Glucose: 97 mg/dL
eAG (mmol/L): 5.4 mmol/L

## 2024-08-12 ENCOUNTER — Ambulatory Visit: Admitting: Endocrinology

## 2024-08-12 ENCOUNTER — Encounter: Payer: Self-pay | Admitting: Endocrinology

## 2024-08-12 VITALS — BP 156/94 | HR 65 | Resp 16 | Ht 72.0 in | Wt 265.6 lb

## 2024-08-12 DIAGNOSIS — E118 Type 2 diabetes mellitus with unspecified complications: Secondary | ICD-10-CM | POA: Diagnosis not present

## 2024-08-12 DIAGNOSIS — R809 Proteinuria, unspecified: Secondary | ICD-10-CM

## 2024-08-12 DIAGNOSIS — Z7984 Long term (current) use of oral hypoglycemic drugs: Secondary | ICD-10-CM | POA: Diagnosis not present

## 2024-08-12 NOTE — Progress Notes (Signed)
 Patient has been having higher than normal BP since his checkup with his BP per patient. Patient's BP was elevated today as well and patient stated he had taken his BP med this morning approximately an hour ago. Patient advised to keep a log of his BP at home and if it continues to stay high or if starts experiencing symptoms that can be related to call PCP. Patient stated an understanding

## 2024-08-12 NOTE — Progress Notes (Signed)
 "  Outpatient Endocrinology Note [name], MD  08/12/24  Patient's Name: Gregory Fitzgerald    DOB: Feb 11, 1966    MRN: [id number]                                                    REASON OF VISIT: Follow up for type 2 diabetes mellitus  PCP: Mahlon Comer BRAVO, MD  HISTORY OF PRESENT ILLNESS:   Gregory Fitzgerald is a 59 y.o. old male with past medical history listed below, is here for follow up for type 2 diabetes mellitus.    Interval history  Glucometer data as reviewed below.  Mostly acceptable blood sugar.  No hypoglycemia.  He has been taking Xigduo  2 tablets daily.  He lost 40 to 50 pound of weight in last 3 months after bariatric surgery.  He has been mostly eating proteins drinks.  Hemoglobin A1c 5%.  Recent laboratory results reviewed, normal electrolytes, renal function and acceptable cholesterol level.  Urine microalbumin creatinine ratio significantly elevated 1966.  Current / Home Diabetic regimen includes:  Xigduo  XR 11/998 mg, 2 tablets daily.  Glycemic data:   He has been using freestyle libre glucometer.  Glucose data reviewed directly from the meter, mostly acceptable blood sugar, some of the blood sugar are 104, 99, 96, 111, 94, 88, 87, 99, 80, 88, 75, 79, 92.  Mostly 80 to low 100 range.  Hypoglycemia: Patient has no hypoglycemic episodes. Patient has hypoglycemia awareness.  Factors modifying glucose control: 1.  Diabetic diet assessment: 3 meals a day.  Protein drinks daily.  He works night shift.  2.  Staying active or exercising: Exercising in the gym 4 days a week.  3.  Medication compliance: compliant all of the time.  Pertinent Diabetes History: Patient was previously seen by Dr. Von and was last time seen in July 2024.  Patient was diagnosed with type 2 diabetes mellitus in 2006.  No personal history of pancreatitis and / or family history of medullary thyroid  carcinoma or MEN 2B syndrome.   Patient underwent laparoscopic sleeve gastrectomy in  April 21, 2024.  Prior diabetic medications: He used to be on glimepiride , Victoza , Invokana  in the past.  There was plan to start basal insulin  in June 2024 however he never started.  He had taken Ozempic  and Mounjaro  in the past.  Chronic Diabetes Complications : Retinopathy: no. Last ophthalmology exam was done on annually, following with ophthalmology regularly.  Nephropathy: Microalbuminuria present, on ACE/ARB /lisinopril  and dapagliflozin . Peripheral neuropathy: no Coronary artery disease: no Stroke: no  Relevant comorbidities and cardiovascular risk factors: Obesity: yes Body mass index is 36.02 kg/m.  Status post bariatric surgery in September 2025. Hypertension: Yes  Hyperlipidemia : Yes, on statin  Treated with Lipitor 20 mg previously but this was stopped because of leg stiffness; also may have had some side effects of leg stiffness from Crestor     He has no leg stiffness or aching with Lescol  XL, taking 80 mg   REVIEW OF SYSTEMS As per history of present illness.   PAST MEDICAL HISTORY: Past Medical History:  Diagnosis Date   Arthritis    knees   Diabetes (HCC)    GERD (gastroesophageal reflux disease)    years ago, not currently    Hyperlipidemia    Hypertension    Trigeminal neuralgia  PAST SURGICAL HISTORY: Past Surgical History:  Procedure Laterality Date   club foot surgery     age 63 months   COLONOSCOPY     KNEE SURGERY     x 2 in high school    LAPAROSCOPIC GASTRIC SLEEVE RESECTION N/A 04/21/2024   Procedure: GASTRECTOMY, SLEEVE, LAPAROSCOPIC;  Surgeon: Kinsinger, Herlene Righter, MD;  Location: [facility];  Service: General;  Laterality: N/A;   UPPER GI ENDOSCOPY N/A 04/21/2024   Procedure: ENDOSCOPY, UPPER GI TRACT;  Surgeon: Kinsinger, Herlene Righter, MD;  Location: [facility];  Service: General;  Laterality: N/A;    ALLERGIES: No Known Allergies  FAMILY HISTORY:  Family History  Problem Relation Age of Onset   Diabetes Mother    Diabetes Father     Lymphoma Father    Colon cancer Neg Hx    Colon polyps Neg Hx    Esophageal cancer Neg Hx    Rectal cancer Neg Hx    Stomach cancer Neg Hx     SOCIAL HISTORY: Social History   Socioeconomic History   Marital status: Married    Spouse name: Not on file   Number of children: Not on file   Years of education: Not on file   Highest education level: Some college, no degree  Occupational History   Not on file  Tobacco Use   Smoking status: Never   Smokeless tobacco: Never  Vaping Use   Vaping status: Never Used  Substance and Sexual Activity   Alcohol use: Not Currently   Drug use: Never   Sexual activity: Not on file  Other Topics Concern   Not on file  Social History Narrative   Not on file   Social Drivers of Health   Tobacco Use: Low Risk (08/12/2024)   Patient History    Smoking Tobacco Use: Never    Smokeless Tobacco Use: Never    Passive Exposure: Not on file  Financial Resource Strain: Low Risk (02/24/2024)   Overall Financial Resource Strain (CARDIA)    Difficulty of Paying Living Expenses: Not very hard  Food Insecurity: No Food Insecurity (04/21/2024)   Epic    Worried About Radiation Protection Practitioner of Food in the Last Year: Never true    Ran Out of Food in the Last Year: Never true  Transportation Needs: No Transportation Needs (04/21/2024)   Epic    Lack of Transportation (Medical): No    Lack of Transportation (Non-Medical): No  Physical Activity: Sufficiently Active (02/24/2024)   Exercise Vital Sign    Days of Exercise per Week: 3 days    Minutes of Exercise per Session: 60 min  Stress: No Stress Concern Present (02/24/2024)   [employer] of Occupational Health - Occupational Stress Questionnaire    Feeling of Stress: Not at all  Social Connections: Moderately Integrated (02/24/2024)   Social Connection and Isolation Panel    Frequency of Communication with Friends and Family: Three times a week    Frequency of Social Gatherings with Friends and Family: Once a  week    Attends Religious Services: More than 4 times per year    Active Member of Clubs or Organizations: No    Attends Banker Meetings: Not on file    Marital Status: Married  Depression (PHQ2-9): Low Risk (02/25/2024)   Depression (PHQ2-9)    PHQ-2 Score: 0  Alcohol Screen: Not on file  Housing: Low Risk (04/21/2024)   Epic    Unable to Pay for Housing in the Last Year:  No    Number of Times Moved in the Last Year: 0    Homeless in the Last Year: No  Utilities: Not At Risk (04/21/2024)   Epic    Threatened with loss of utilities: No  Health Literacy: Not on file    MEDICATIONS:  Current Outpatient Medications  Medication Sig Dispense Refill   aspirin 81 MG tablet Take 81 mg by mouth daily.     blood glucose meter kit and supplies KIT Dispense based on patient and insurance preference. Use up to two times daily as directed. 1 each 0   Blood Glucose Monitoring Suppl (ACCU-CHEK AVIVA PLUS) w/Device KIT Use to check blood sugar twice a daily. 1 kit 0   carbamazepine  (TEGRETOL ) 200 MG tablet Take 1 tablet (200 mg total) by mouth 2 (two) times daily. 180 tablet 1   clotrimazole -betamethasone  (LOTRISONE ) cream APPLY TO AFFECTED AREA TWICE A DAY 45 g 0   Dapagliflozin  Pro-metFORMIN  ER (XIGDUO  XR) 11-998 MG TB24 TAKE 2 TABLETS BY MOUTH EVERY DAY 180 tablet 3   enoxaparin  (LOVENOX ) 40 MG/0.4ML injection Inject 0.4 mLs (40 mg total) into the skin every 12 (twelve) hours for 14 days. 11.2 mL 0   ezetimibe  (ZETIA ) 10 MG tablet Take 1 tablet (10 mg total) by mouth daily. 90 tablet 3   fluvastatin  XL (LESCOL  XL) 80 MG 24 hr tablet Take 1 tablet (80 mg total) by mouth daily. 90 tablet 3   Glucose Blood (BLOOD GLUCOSE TEST STRIPS) STRP 1 each by In Vitro route daily. May substitute to any manufacturer covered by patient's insurance. 100 each 3   glucose blood (FREESTYLE LITE) test strip USE TWICE A DAY to check blood glucose 100 strip 3   lisinopril  (ZESTRIL ) 40 MG tablet Take 1 tablet  (40 mg total) by mouth daily. 90 tablet 3   ondansetron  (ZOFRAN -ODT) 4 MG disintegrating tablet Dissolve 1 tablet (4 mg total) by mouth every 6 (six) hours as needed for nausea or vomiting. 20 tablet 0   pantoprazole  (PROTONIX ) 40 MG tablet Take 1 tablet (40 mg total) by mouth daily. 90 tablet 0   tadalafil  (CIALIS ) 20 MG tablet TAKE 1 TABLET EVERY DAY AS NEEDED FOR ERECTILE DYSFUNCTION 10 tablet 2   No current facility-administered medications for this visit.    PHYSICAL EXAM: Vitals:   08/12/24 1007 08/12/24 1009  BP: (!) 168/100 (!) 156/94  Pulse: 65   Resp: 16   SpO2: 95%   Weight: 265 lb 9.6 oz (120.5 kg)   Height: 6' (1.829 m)        Body mass index is 36.02 kg/m.  Wt Readings from Last 3 Encounters:  08/12/24 265 lb 9.6 oz (120.5 kg)  07/21/24 271 lb 12.8 oz (123.3 kg)  05/22/24 (!) 304 lb 8 oz (138.1 kg)    General: Well developed, well nourished male in no apparent distress.  HEENT: AT/[place], no external lesions.  Eyes: Conjunctiva clear and no icterus. Neck: Neck supple  Lungs: Respirations not labored Neurologic: Alert, oriented, normal speech Extremities / Skin: Dry.   Psychiatric: Does not appear depressed or anxious  Diabetic Foot Exam - Simple   No data filed    LABS Reviewed Lab Results  Component Value Date   HGBA1C 5.0 08/07/2024   HGBA1C 7.3 (H) 04/16/2024   HGBA1C >14.0 (H) 02/19/2024   Lab Results  Component Value Date   FRUCTOSAMINE 311 (H) 01/04/2023   FRUCTOSAMINE 403 (H) 03/08/2020   FRUCTOSAMINE 239 12/19/2017   Lab Results  Component Value Date   CHOL 164 08/07/2024   HDL 58 08/07/2024   LDLCALC 90 08/07/2024   LDLDIRECT 113.0 11/03/2022   TRIG 74 08/07/2024   CHOLHDL 2.8 08/07/2024   Lab Results  Component Value Date   MICRALBCREAT 1,966 (H) 08/07/2024   MICRALBCREAT 859 (H) 02/19/2024   Lab Results  Component Value Date   CREATININE 0.83 08/07/2024   Lab Results  Component Value Date   GFR 89.65 05/22/2024     ASSESSMENT / PLAN  1. Controlled diabetes mellitus type 2 with complications (HCC)   2. Microalbuminuria     Diabetes Mellitus type 2, complicated by microalbuminuria. - Diabetic status / severity: Controlled..  Lab Results  Component Value Date   HGBA1C 5.0 08/07/2024    - Hemoglobin A1c goal : <6.5%  Patient underwent sleeve gastrectomy in September 2025, lost significant weight.  It helped for improvement on diabetes/blood sugar control.  - Medications: See below, no change.  I) continue Xigduo  XR 11/998 mg 2 tablets daily.  Consider GLP-1 receptor agonist in the future if no optimal weight loss after bariatric surgery.  - Home glucose testing: advised to monitor blood sugar at least in the morning fasting.  - Discussed/ Gave Hypoglycemia treatment plan.  # Consult : not required at this time.   # Annual urine for microalbuminuria/ creatinine ratio,  microalbuminuria currently, continue ACE/ARB /lisinopril  and dapagliflozin .  Significantly elevated urine microalbumin creatinine ratio.  Unclear reason.  ? He has been drinking increased amount of protein lately.  Advised to avoid excessive amount of protein drinks, okay to drink in moderate amount.  -Recheck urine microalbumin creatinine ratio in 4 weeks.  If remains elevated will refer to nephrology for evaluation.  Last  Lab Results  Component Value Date   MICRALBCREAT 1,966 (H) 08/07/2024    # Foot check nightly.  # Annual dilated diabetic eye exams.   - Diet: Make healthy diabetic food choices - Life style / activity / exercise: Discussed.  2. Blood pressure  -  BP Readings from Last 1 Encounters:  08/12/24 (!) 156/94    - Control is not in target. Advised to monitor at home and if remains elevated asked to talk with primary care provider.  He is asymptomatic. - No change in current plans.  3. Lipid status / Hyperlipidemia - Last  Lab Results  Component Value Date   LDLCALC 90 08/07/2024   -  Continue Escol XL, taking 80 mg daily.  Zetia  10 mg daily.   Diagnoses and all orders for this visit:  Controlled diabetes mellitus type 2 with complications (HCC) -     Microalbumin / creatinine urine ratio  Microalbuminuria -     Microalbumin / creatinine urine ratio   DISPOSITION Follow up in clinic in 4 months suggested.  Labs in 4 weeks as ordered.  All questions answered and patient verbalized understanding of the plan.  Denessa Cavan, MD [facility] [address] [place], [address] [place], [place] [address] Phone # [phone]  At least part of this note was generated using voice recognition software. Inadvertent word errors may have occurred, which were not recognized during the proofreading process. "

## 2024-08-15 ENCOUNTER — Other Ambulatory Visit: Payer: Self-pay | Admitting: Endocrinology

## 2024-08-28 ENCOUNTER — Ambulatory Visit: Admitting: Family Medicine

## 2024-08-28 VITALS — BP 114/74 | HR 75 | Ht 71.0 in | Wt 261.6 lb

## 2024-08-28 DIAGNOSIS — Z0000 Encounter for general adult medical examination without abnormal findings: Secondary | ICD-10-CM

## 2024-08-28 DIAGNOSIS — Z23 Encounter for immunization: Secondary | ICD-10-CM

## 2024-08-28 DIAGNOSIS — Z125 Encounter for screening for malignant neoplasm of prostate: Secondary | ICD-10-CM

## 2024-08-28 LAB — HEPATIC FUNCTION PANEL
ALT: 11 U/L (ref 3–53)
AST: 19 U/L (ref 5–37)
Albumin: 3.7 g/dL (ref 3.5–5.2)
Alkaline Phosphatase: 55 U/L (ref 39–117)
Bilirubin, Direct: 0.1 mg/dL (ref 0.1–0.3)
Total Bilirubin: 0.6 mg/dL (ref 0.2–1.2)
Total Protein: 6.4 g/dL (ref 6.0–8.3)

## 2024-08-28 LAB — CBC WITH DIFFERENTIAL/PLATELET
Basophils Absolute: 0 10*3/uL (ref 0.0–0.1)
Basophils Relative: 0.7 % (ref 0.0–3.0)
Eosinophils Absolute: 0.3 10*3/uL (ref 0.0–0.7)
Eosinophils Relative: 5.5 % — ABNORMAL HIGH (ref 0.0–5.0)
HCT: 42.8 % (ref 39.0–52.0)
Hemoglobin: 14.4 g/dL (ref 13.0–17.0)
Lymphocytes Relative: 38.6 % (ref 12.0–46.0)
Lymphs Abs: 1.8 10*3/uL (ref 0.7–4.0)
MCHC: 33.7 g/dL (ref 30.0–36.0)
MCV: 99.9 fl (ref 78.0–100.0)
Monocytes Absolute: 0.4 10*3/uL (ref 0.1–1.0)
Monocytes Relative: 9.3 % (ref 3.0–12.0)
Neutro Abs: 2.2 10*3/uL (ref 1.4–7.7)
Neutrophils Relative %: 45.9 % (ref 43.0–77.0)
Platelets: 204 10*3/uL (ref 150.0–400.0)
RBC: 4.29 Mil/uL (ref 4.22–5.81)
RDW: 12.7 % (ref 11.5–15.5)
WBC: 4.8 10*3/uL (ref 4.0–10.5)

## 2024-08-28 LAB — PSA: PSA: 0.52 ng/mL (ref 0.10–4.00)

## 2024-08-28 LAB — TSH: TSH: 1.06 u[IU]/mL (ref 0.35–5.50)

## 2024-08-28 NOTE — Assessment & Plan Note (Signed)
 Pt's PE WNL w/ exception of BMI.  He continues to lose weight since bariatric surgery- applauded his efforts.  UTD on colonoscopy, Tdap, flu, eye exam, foot exam, microalbumin.  Prevnar done today.  Will get labs not done at Endo last month.  Anticipatory guidance provided.

## 2024-08-28 NOTE — Patient Instructions (Signed)
 Follow up in 6 months to recheck blood pressure and cholesterol We'll notify you of your lab results and make any changes if needed Continue to work on healthy diet and regular exercise- you're doing great!!! Call with any questions or concerns Stay Safe!  Stay Healthy! Happy Valentine's Day!!!

## 2024-08-28 NOTE — Progress Notes (Signed)
" ° °  Subjective:    Patient ID: Gregory Fitzgerald, male    DOB: 1965/08/09, 59 y.o.   MRN: [id number]  HPI CPE- UTD on eye exam, foot exam, microalbumin, A1C.  Also UTD on colonoscopy, Tdap, flu.  Will get Prevnar today  Health Maintenance  Topic Date Due   Pneumococcal Vaccine: 50+ Years (1 of 2 - PCV) Never done   Zoster Vaccines- Shingrix (1 of 2) 11/25/2024 (Originally 02/26/2018)   COVID-19 Vaccine (4 - 2025-26 season) 03/23/2025 (Originally 03/24/2024)   Hepatitis B Vaccines 19-59 Average Risk (1 of 3 - 19+ 3-dose series) 08/28/2025 (Originally 02/27/1987)   HEMOGLOBIN A1C  02/04/2025   FOOT EXAM  02/24/2025   OPHTHALMOLOGY EXAM  04/28/2025   Diabetic kidney evaluation - eGFR measurement  08/07/2025   Diabetic kidney evaluation - Urine ACR  08/07/2025   Colonoscopy  05/15/2026   DTaP/Tdap/Td (2 - Td or Tdap) 09/05/2028   Influenza Vaccine  Completed   HPV VACCINES (No Doses Required) Completed   Hepatitis C Screening  Completed   HIV Screening  Completed   Meningococcal B Vaccine  Aged Out     Patient Care Team    Relationship Specialty Notifications Start End  Mahlon Comer BRAVO, MD PCP - General Family Medicine  09/05/18   Zan Factor, DPM Consulting Physician Podiatry  09/05/18   Vision works    08/24/23   Thapa, Sudan, MD Consulting Physician Endocrinology  02/25/24       Review of Systems Patient reports no vision/hearing changes, anorexia, fever ,adenopathy, persistant/recurrent hoarseness, swallowing issues, chest pain, palpitations, edema, persistant/recurrent cough, hemoptysis, dyspnea (rest,exertional, paroxysmal nocturnal), gastrointestinal  bleeding (melena, rectal bleeding), abdominal pain, excessive heart burn, GU symptoms (dysuria, hematuria, voiding/incontinence issues) syncope, focal weakness, memory loss, numbness & tingling, skin/hair/nail changes, depression, anxiety, abnormal bruising/bleeding, musculoskeletal symptoms/signs.     Objective:    Physical Exam General Appearance:    Alert, cooperative, no distress, appears stated age  Head:    Normocephalic, without obvious abnormality, atraumatic  Eyes:    PERRL, conjunctiva/corneas clear, EOM's intact both eyes       Ears:    Normal TM's and external ear canals, both ears  Nose:   Nares normal, septum midline, mucosa normal, no drainage   or sinus tenderness  Throat:   Lips, mucosa, and tongue normal; teeth and gums normal  Neck:   Supple, symmetrical, trachea midline, no adenopathy;       thyroid :  No enlargement/tenderness/nodules  Back:     Symmetric, no curvature, ROM normal, no CVA tenderness  Lungs:     Clear to auscultation bilaterally, respirations unlabored  Chest wall:    No tenderness or deformity  Heart:    Regular rate and rhythm, S1 and S2 normal, no murmur, rub   or gallop  Abdomen:     Soft, non-tender, bowel sounds active all four quadrants,    no masses, no organomegaly  Genitalia:    deferred  Rectal:    Extremities:   Extremities normal, atraumatic, no cyanosis or edema  Pulses:   2+ and symmetric all extremities  Skin:   Skin color, texture, turgor normal, no rashes or lesions  Lymph nodes:   Cervical, supraclavicular, and axillary nodes normal  Neurologic:   CNII-XII intact. Normal strength, sensation and reflexes      throughout          Assessment & Plan:  "

## 2024-08-29 ENCOUNTER — Ambulatory Visit: Payer: Self-pay | Admitting: Family Medicine

## 2024-09-09 ENCOUNTER — Other Ambulatory Visit

## 2024-10-27 ENCOUNTER — Ambulatory Visit: Admitting: Neurology

## 2024-12-10 ENCOUNTER — Ambulatory Visit: Admitting: Endocrinology

## 2025-02-25 ENCOUNTER — Ambulatory Visit: Admitting: Family Medicine
